# Patient Record
Sex: Female | Born: 1995
Health system: Southern US, Community
[De-identification: ages and names within clinical notes are randomized; demographics above are authoritative.]

## PROBLEM LIST (undated history)

## (undated) DIAGNOSIS — I472 Ventricular tachycardia: Secondary | ICD-10-CM

## (undated) DIAGNOSIS — I493 Ventricular premature depolarization: Secondary | ICD-10-CM

## (undated) DIAGNOSIS — I4721 Torsades de pointes: Secondary | ICD-10-CM

## (undated) DIAGNOSIS — E739 Lactose intolerance, unspecified: Secondary | ICD-10-CM

## (undated) DIAGNOSIS — I428 Other cardiomyopathies: Secondary | ICD-10-CM

## (undated) HISTORY — DX: Lactose intolerance, unspecified: E73.9

---
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
## 2016-03-09 ENCOUNTER — Emergency Department (HOSPITAL_COMMUNITY)
Admission: EM | Admit: 2016-03-09 | Discharge: 2016-03-09 | Disposition: A | Discharge status: Home / Self Care | Payer: BLUE CROSS/BLUE SHIELD | Attending: Emergency Medicine | Admitting: Emergency Medicine

## 2016-03-09 ENCOUNTER — Encounter (HOSPITAL_COMMUNITY): Payer: Self-pay | Admitting: Emergency Medicine

## 2016-03-09 DIAGNOSIS — Z3202 Encounter for pregnancy test, result negative: Secondary | ICD-10-CM | POA: Insufficient documentation

## 2016-03-09 DIAGNOSIS — M545 Low back pain: Secondary | ICD-10-CM | POA: Diagnosis present

## 2016-03-09 DIAGNOSIS — N12 Tubulo-interstitial nephritis, not specified as acute or chronic: Secondary | ICD-10-CM | POA: Diagnosis not present

## 2016-03-09 DIAGNOSIS — Z793 Long term (current) use of hormonal contraceptives: Secondary | ICD-10-CM | POA: Insufficient documentation

## 2016-03-09 LAB — URINALYSIS, ROUTINE W REFLEX MICROSCOPIC
BILIRUBIN URINE: NEGATIVE
GLUCOSE, UA: NEGATIVE mg/dL
KETONES UR: NEGATIVE mg/dL
Nitrite: NEGATIVE
PH: 7 (ref 5.0–8.0)
PROTEIN: NEGATIVE mg/dL
Specific Gravity, Urine: 1.005 (ref 1.005–1.030)

## 2016-03-09 LAB — URINE MICROSCOPIC-ADD ON

## 2016-03-09 LAB — POC URINE PREG, ED: Preg Test, Ur: NEGATIVE

## 2016-03-09 MED ORDER — CEPHALEXIN 500 MG PO CAPS: 500.0000 mg | ORAL_CAPSULE | Freq: Two times a day (BID) | ORAL | Status: DC

## 2016-03-09 MED ORDER — CEPHALEXIN 500 MG PO CAPS
500.0000 mg | ORAL_CAPSULE | Freq: Once | ORAL | Status: AC
  Administered 2016-03-09: 500 mg via ORAL
  Filled 2016-03-09: qty 1

## 2016-03-09 MED ORDER — ONDANSETRON 4 MG PO TBDP: 4.0000 mg | ORAL_TABLET | Freq: Three times a day (TID) | ORAL | Status: DC | PRN

## 2016-03-09 NOTE — Discharge Instructions (Signed)

## 2016-03-09 NOTE — ED Provider Notes (Signed)
CSN: [id number]     Arrival date & time 03/09/16  0016 History  By signing my name below, I, [facility], attest that this documentation has been prepared under the direction and in the presence of Laurence Spates, MD. Electronically Signed: Randell Patient, ED Scribe. 03/09/2016. 1:51 AM.   Chief Complaint  Patient presents with  . Back Pain   The history is provided by the patient. No language interpreter was used.   HPI Comments: [name] is a 20 y.o. female who presents to the Emergency Department complaining of constant, mild, gradually worsening bilateral lower back pain onset 1 week ago. She reports dysuria that lasted for 2 days before resolving yesterday, nausea, chills, fever TMAX 100, diarrhea yesterday, and dark colored urine that she associates with drinking less fluid. Denies hx of chronic conditions. Denies abdominal pain, emesis, vaginal bleeding, vaginal discharge. NKDA.  Past Medical History  Diagnosis Date  . Lactose intolerance    History reviewed. No pertinent past surgical history. Family History  Problem Relation Age of Onset  . Hyperlipidemia Maternal Grandfather    Social History  Substance Use Topics  . Smoking status: Never Smoker   . Smokeless tobacco: Never Used  . Alcohol Use: No   OB History    No data available     Review of Systems A complete 10 system review of systems was obtained and all systems are negative except as noted in the HPI and PMH.   Allergies  Gluten meal  Home Medications   Prior to Admission medications   Medication Sig Start Date End Date Taking? Authorizing Provider  ibuprofen (ADVIL,MOTRIN) 200 MG tablet Take 400 mg by mouth every 6 (six) hours as needed for headache, mild pain or moderate pain.   Yes Historical Provider, MD  JUNEL 1.5/30 1.5-30 MG-MCG tablet Take 1 tablet by mouth daily. 02/28/16  Yes Historical Provider, MD  cephALEXin (KEFLEX) 500 MG capsule Take 1 capsule (500 mg total) by  mouth 2 (two) times daily. 03/09/16   Laurence Spates, MD  ondansetron (ZOFRAN ODT) 4 MG disintegrating tablet Take 1 tablet (4 mg total) by mouth every 8 (eight) hours as needed for nausea or vomiting. 03/09/16   Ambrose [place] Talyn Eddie, MD   BP 123/68 mmHg  Pulse 65  Temp(Src) 99 F (37.2 C) (Oral)  Resp 20  Ht 5\' 6"  (1.676 m)  Wt 130 lb (58.968 kg)  BMI 20.99 kg/m2  SpO2 97%  LMP 02/23/2016 (Approximate) Physical Exam  Constitutional: She is oriented to person, place, and time. She appears well-developed and well-nourished. No distress.  HENT:  Head: Normocephalic and atraumatic.  Moist mucous membranes  Eyes: Conjunctivae are normal. Pupils are equal, round, and reactive to light.  Neck: Neck supple.  Cardiovascular: Normal rate, regular rhythm and normal heart sounds.   No murmur heard. Pulmonary/Chest: Effort normal and breath sounds normal.  Abdominal: Soft. Bowel sounds are normal. She exhibits no distension. There is no tenderness.  Genitourinary:  No CVA tenderness  Musculoskeletal: She exhibits no edema.  Neurological: She is alert and oriented to person, place, and time.  Fluent speech  Skin: Skin is warm and dry.  Psychiatric: She has a normal mood and affect. Judgment normal.  Nursing note and vitals reviewed.   ED Course  Procedures   DIAGNOSTIC STUDIES: Oxygen Saturation is 97% on RA, normal by my interpretation.    COORDINATION OF CARE: 1:45 AM Discussed lab results. Will order urine culture. Will order and prescribe  Keflex. Will prescribe Zofran. Advised pt to increase fluid intake. Advised pt to return if symptoms worsen. Discussed treatment plan with pt at bedside and pt agreed to plan.   Labs Review Labs Reviewed  URINALYSIS, ROUTINE W REFLEX MICROSCOPIC (NOT AT [facility]) - Abnormal; Notable for the following:    APPearance CLOUDY (*)    Hgb urine dipstick TRACE (*)    Leukocytes, UA TRACE (*)    All other components within normal limits  URINE  MICROSCOPIC-ADD ON - Abnormal; Notable for the following:    Squamous Epithelial / LPF 0-5 (*)    Bacteria, UA FEW (*)    All other components within normal limits  URINE CULTURE  POC URINE PREG, ED   I have personally reviewed and evaluated these  lab results as part of my medical decision-making.  Medications  cephALEXin (KEFLEX) capsule 500 mg (500 mg Oral Given 03/09/16 0158)     MDM   Final diagnoses:  Pyelonephritis   Patient with 1 week of low back pain bilaterally and several days of dysuria followed by nausea and chills today. On exam she was well-appearing with normal vital signs. No abdominal tenderness or CVA tenderness. UA shows leukocytes, large amount of wbc's and bacteria. Urine culture sent. UA suggest infection and given her symptoms I suspect pyelonephritis. Gave dose of Keflex. Patient is well-hydrated, has had no vomiting, and is afebrile here therefore I feel she is safe for discharge home. Provided with Keflex for treatment of pyelonephritis and emphasized the importance of return precautions including any worsening symptoms. Patient and her mom voiced understanding and she was discharged in satisfactory condition.  I personally performed the services described in this documentation, which was scribed in my presence. The recorded information has been reviewed and is accurate.    Laurence Spates, MD 03/09/16 [id number]

## 2016-03-09 NOTE — ED Notes (Signed)
Pt states she has been having lower back pain for about week   Pt states the pain has progressively gotten worse and yesterday she started having nausea and today she developed chills and a low grade fever

## 2016-03-11 LAB — URINE CULTURE
Culture: >=100000 — AB
SPECIAL REQUESTS: NORMAL

## 2016-03-13 ENCOUNTER — Telehealth (HOSPITAL_BASED_OUTPATIENT_CLINIC_OR_DEPARTMENT_OTHER): Payer: Self-pay | Admitting: Emergency Medicine

## 2016-03-13 NOTE — Telephone Encounter (Signed)
Post ED Visit - Positive Culture Follow-up  Culture report reviewed by antimicrobial stewardship pharmacist:  []  Enzo Bi, Pharm.D. []  Celedonio Miyamoto, Pharm.D., BCPS []  Garvin Fila, Pharm.D. []  Georgina Pillion, Pharm.D., BCPS []  [place], [place].D., BCPS, AAHIVP []  Estella Husk, Pharm.D., BCPS, AAHIVP [x]  Tennis Must, Pharm.D. []  Sherle Poe, [place].D.  Positive urine culture Treated with cephalexin, organism sensitive to the same and no further patient follow-up is required at this time.  Berle Mull 03/13/2016, 10:57 AM

## 2021-01-18 NOTE — Progress Notes (Unsigned)
Cardiology Office Note:    Date:  01/20/2021   ID:  GENESEE NASE, DOB 1996/05/21, MRN [id number]  PCP:  Patient, No Pcp Per  Cardiologist:  No primary care provider on file.  Electrophysiologist:  None   Referring MD: No ref. provider found   Chief Complaint  Patient presents with  . Congestive Heart Failure    History of Present Illness:    [name] is a 25 y.o. female with a hx of recently diagnosed combined systolic and diastolic heart failure who presents for an initial evaluation.  She is from [place] but is currently in graduate school in [place].  She presented to the ED in [place] on 11/11/2020 with chest pain and palpitations.  CTPA was done and showed no evidence of PE.  High-sensitivity troponins were elevated but flat (72 > 73 > 68).  She was discharged from the ED but given elevated troponin was referred to cardiology for evaluation.  She saw Dr. Sela Hua in [facility] on 11/19/2020 and echocardiogram and Holter monitor were ordered.  Echocardiogram on 12/03/2020 showed severe LV dilatation, severe systolic dysfunction (EF 15 to 20%), normal RV function, no significant valvular disease.  Holter monitor showed rare ectopy and 6 beats run of NSVT.  Given her systolic dysfunction, Lexiscan Myoview was done on 01/04/2021, which showed anterior perfusion defect concerning for ischemia, EF 26%.  She presented back to the ED with chest pain on 01/09/2021.  Cardiac catheterization on 01/10/2021 showed normal coronary arteries, normal LVEDP.  She has been started on GDMT, but has been limited by soft blood pressures.  Currently on Entresto 24-26 mg twice daily, carvedilol 4.6875 mg twice daily, and Jardiance 10 mg daily.  No smoking history.  No history of heart disease in her immediate family.  She reports that she first started noticing lightheadedness in September 2021.  Subsequently had COVID-19 infection in October and noted worsening shortness of  breath and lightheadedness since that time.  Also noted intermittent palpitations.  Symptoms progressively worsened in December and January, prompting her to present to the ED on 1/6.  She currently reports that she is doing okay.  Has been having lethargy but has improved.  Reports she gets short of breath with exertion, particular when walking up stairs.  She continues to have intermittent lightheadedness but has improved.  No LE edema, reports weight has been stable.  Has been monitoring BP at home, has been in the 90s to 100s over 50s to 70s.  Reports she feels lightheaded when BP is in the 90s.  Has been checking her pulse at home and will note that it will sometimes drop to the 30s.  Past Medical History:  Diagnosis Date  . Lactose intolerance     History reviewed. No pertinent surgical history.  Current Medications: Current Meds  Medication Sig  . carvedilol (COREG) 3.125 MG tablet Take 1.5 tablets by mouth in the morning and at bedtime.  . empagliflozin (JARDIANCE) 10 MG TABS tablet Take 1 tablet by mouth daily.  Samuel Jester 0.18/0.215/0.25 MG-35 MCG tablet Take 1 tablet by mouth daily.  . valACYclovir (VALTREX) 1000 MG tablet Take 1,000 mg by mouth as needed for rash.  . [DISCONTINUED] cephALEXin (KEFLEX) 500 MG capsule Take 1 capsule (500 mg total) by mouth 2 (two) times daily.  . [DISCONTINUED] ibuprofen (ADVIL,MOTRIN) 200 MG tablet Take 400 mg by mouth every 6 (six) hours as needed for headache, mild pain or moderate pain.  . [DISCONTINUED] Colleen Can  1.5/30 1.5-30 MG-MCG tablet Take 1 tablet by mouth daily.  . [DISCONTINUED] ondansetron (ZOFRAN ODT) 4 MG disintegrating tablet Take 1 tablet (4 mg total) by mouth every 8 (eight) hours as needed for nausea or vomiting.     Allergies:   Gluten meal   Social History   Socioeconomic History  . Marital status: Single    Spouse name: Not on file  . Number of children: Not on file  . Years of education: Not on file  . Highest  education level: Not on file  Occupational History  . Not on file  Tobacco Use  . Smoking status: Never Smoker  . Smokeless tobacco: Never Used  Substance and Sexual Activity  . Alcohol use: No  . Drug use: No  . Sexual activity: Never  Other Topics Concern  . Not on file  Social History Narrative   [employer]   10th grade.   volleyball   Social Determinants of Corporate investment banker Strain: Not on file  Food Insecurity: Not on file  Transportation Needs: Not on file  Physical Activity: Not on file  Stress: Not on file  Social Connections: Not on file     Family History: The patient's family history includes Hyperlipidemia in her maternal grandfather.  ROS:   Please see the history of present illness.     All other systems reviewed and are negative.  EKGs/Labs/Other Studies Reviewed:    The following studies were reviewed today:   EKG:  EKG is ordered today.  The ekg ordered today demonstrates normal sinus rhythm, rate 91, <47mm ST depression in I, V5/6, QRS 104, QTc 477  Recent Labs: 01/19/2021: ALT 11; BUN 10; Creatinine, Ser 0.85; Hemoglobin 13.7; Magnesium 2.1; Platelets 281; Potassium 4.5; Sodium 139  Recent Lipid Panel No results found for: CHOL, TRIG, HDL, CHOLHDL, VLDL, LDLCALC, LDLDIRECT  Physical Exam:    VS:  BP 128/72   Pulse 99   Ht 5\' 6"  (1.676 m)   Wt 182 lb (82.6 kg)   SpO2 99%   BMI 29.38 kg/m     Wt Readings from Last 3 Encounters:  01/19/21 182 lb (82.6 kg)  03/09/16 130 lb (59 kg) (59 %, Z= 0.22)*  04/11/13 138 lb 7 oz (62.8 kg) (80 %, Z= 0.83)*   * Growth percentiles are based on CDC (Girls, 2-20 Years) data.     GEN:  in no acute distress HEENT: Normal NECK: No JVD; No carotid bruits LYMPHATICS: No lymphadenopathy CARDIAC: RRR, no murmurs, rubs, gallops RESPIRATORY:  Clear to auscultation without rales, wheezing or rhonchi  ABDOMEN: Soft, non-tender, non-distended MUSCULOSKELETAL:  No edema; No deformity  SKIN:  Warm and dry NEUROLOGIC:  Alert and oriented x 3 PSYCHIATRIC:  Normal affect   ASSESSMENT:    1. Acute combined systolic and diastolic heart failure (HCC)   2. Bradycardia   3. PVC's (premature ventricular contractions)    PLAN:    Acute combined systolic and diastolic heart failure: Echocardiogram on 12/03/2020 showed severe LV dilatation, severe systolic dysfunction (EF 15 to 20%), normal RV function, no significant valvular disease.  Cardiac catheterization on 01/10/2021 showed normal coronary arteries, normal LVEDP.  Unclear cause of cardiomyopathy, could be myocarditis following COVID infection in October 2021.  She has been following with cardiology in November 2021 where she is a [place], but is planning on staying in [place] until at least August 2022. -Cardiac MRI to work-up etiology of nonischemic cardiomyopathy -Referral to Advanced Heart Failure -Continue  Entresto 24-26 mg twice daily, carvedilol 4.6875 mg twice daily, and Jardiance 10 mg daily.  Ideally would add spironolactone, but she reports BP has been 90s to 100s at home she has been having lightheadedness when BP is in the 90s.  Unable to titrate medications at this time  -Normal TSH on recent labs, will check CMET, CBC, iron studies -Recommend avoiding exercise for now as myocarditis on differential, will f/u results of cardiac MRI  Bradycardia/PVCs: reports has noted HR down to 30s when checks at home.  Will check Zio patch x 3 days  RTC in 1 month  Medication Adjustments/Labs and Tests Ordered: Current medicines are reviewed at length with the patient today.  Concerns regarding medicines are outlined above.  Orders Placed This Encounter  Procedures  . MR CARDIAC MORPHOLOGY W WO CONTRAST  . Comprehensive metabolic panel  . CBC  . Magnesium  . Fe+TIBC+Fer  . AMB referral to CHF clinic  . LONG TERM MONITOR (3-14 DAYS)  . EKG 12-Lead   No orders of the defined types were placed in this  encounter.   Patient Instructions  Medication Instructions:  Your physician recommends that you continue on your current medications as directed. Please refer to the Current Medication list given to you today.  *If you need a refill on your cardiac medications before your next appointment, please call your pharmacy*   Lab Work: CMET, CBC, Mag, Iron studies today  If you have labs (blood work) drawn today and your tests are completely normal, you will receive your results only by: Marland Kitchen MyChart Message (if you have MyChart) OR . A paper copy in the mail If you have any lab test that is abnormal or we need to change your treatment, we will call you to review the results.   Testing/Procedures: Your physician has requested that you have a cardiac MRI. Cardiac MRI uses a computer to create images of your heart as its beating, producing both still and moving pictures of your heart and major blood vessels. For further information please visit [email or web address]. Please follow the instruction sheet given to you today for more information.   ZIO XT- Long Term Monitor Instructions   Your physician has requested you wear your ZIO patch monitor 3 days.   This is a single patch monitor.  [employer] supplies one patch monitor per enrollment.  Additional stickers are not available.   Please do not apply patch if you will be having a Nuclear Stress Test, Echocardiogram, Cardiac CT, MRI, or Chest Xray during the time frame you would be wearing the monitor. The patch cannot be worn during these tests.  You cannot remove and re-apply the ZIO XT patch monitor.   Your ZIO patch monitor will be sent USPS Priority mail from [facility] directly to your home address. The monitor may also be mailed to a PO BOX if home delivery is not available.   It may take 3-5 days to receive your monitor after you have been enrolled.   Once you have received you monitor, please review enclosed instructions.  Your  monitor has already been registered assigning a specific monitor serial # to you.   Applying the monitor   Shave hair from upper left chest.   Hold abrader disc by orange tab.  Rub abrader in 40 strokes over left upper chest as indicated in your monitor instructions.   Clean area with 4 enclosed alcohol pads .  Use all pads to assure are is cleaned thoroughly.  Let  dry.   Apply patch as indicated in monitor instructions.  Patch will be place under collarbone on left side of chest with arrow pointing upward.   Rub patch adhesive wings for 2 minutes.Remove white label marked "1".  Remove white label marked "2".  Rub patch adhesive wings for 2 additional minutes.   While looking in a mirror, press and release button in center of patch.  A small green light will flash 3-4 times .  This will be your only indicator the monitor has been turned on.     Do not shower for the first 24 hours.  You may shower after the first 24 hours.   Press button if you feel a symptom. You will hear a small click.  Record Date, Time and Symptom in the Patient Log Book.   When you are ready to remove patch, follow instructions on last 2 pages of Patient Log Book.  Stick patch monitor onto last page of Patient Log Book.   Place Patient Log Book in [facility] box.  Use locking tab on box and tape box closed securely.  The [employer] and [employer] has [employer] on it.  Please place in mailbox as soon as possible.  Your physician should have your test results approximately 7 days after the monitor has been mailed back to [facility].   Call [facility] Customer Care at [phone] if you have questions regarding your ZIO XT patch monitor.  Call them immediately if you see an orange light blinking on your monitor.   If your monitor falls off in less than 4 days contact our Monitor department at [phone].  If your monitor becomes loose or falls off after 4 days call [employer] at [phone] for suggestions on  securing your monitor.   Follow-Up: At [facility], you and your health needs are our priority.  As part of our continuing mission to provide you with exceptional heart care, we have created designated Provider Care Teams.  These Care Teams include your primary Cardiologist (physician) and Advanced Practice Providers (APPs -  Physician Assistants and Nurse Practitioners) who all work together to provide you with the care you need, when you need it.  We recommend signing up for the patient portal called "MyChart".  Sign up information is provided on this After Visit Summary.  MyChart is used to connect with patients for Virtual Visits (Telemedicine).  Patients are able to view lab/test results, encounter notes, upcoming appointments, etc.  Non-urgent messages can be sent to your provider as well.   To learn more about what you can do with MyChart, go to [email or web address].    Your next appointment:   1 month(s)  The format for your next appointment:   In Person  Provider:   Epifanio Lesches, MD   Other Instructions Please check your blood pressure at home daily, write it down.  Call the office or send message via Mychart with the readings in 1-2 weeks for Dr. Bjorn Pippin to review.    You have been referred to: Advanced Heart Failure Clinic at [facility]      Signed, Little Ishikawa, MD  01/20/2021 3:38 PM    [facility]

## 2021-01-19 ENCOUNTER — Encounter: Payer: Self-pay | Admitting: Cardiology

## 2021-01-19 ENCOUNTER — Encounter: Payer: Self-pay | Admitting: Radiology

## 2021-01-19 ENCOUNTER — Ambulatory Visit (INDEPENDENT_AMBULATORY_CARE_PROVIDER_SITE_OTHER): Payer: BC Managed Care – PPO

## 2021-01-19 ENCOUNTER — Ambulatory Visit (INDEPENDENT_AMBULATORY_CARE_PROVIDER_SITE_OTHER): Payer: BC Managed Care – PPO | Admitting: Cardiology

## 2021-01-19 ENCOUNTER — Other Ambulatory Visit: Payer: Self-pay

## 2021-01-19 VITALS — BP 128/72 | HR 99 | Ht 66.0 in | Wt 182.0 lb

## 2021-01-19 DIAGNOSIS — R001 Bradycardia, unspecified: Secondary | ICD-10-CM

## 2021-01-19 DIAGNOSIS — I5041 Acute combined systolic (congestive) and diastolic (congestive) heart failure: Secondary | ICD-10-CM | POA: Diagnosis not present

## 2021-01-19 DIAGNOSIS — I493 Ventricular premature depolarization: Secondary | ICD-10-CM | POA: Diagnosis not present

## 2021-01-19 LAB — COMPREHENSIVE METABOLIC PANEL
ALT: 11 IU/L (ref 0–32)
AST: 19 IU/L (ref 0–40)
Albumin/Globulin Ratio: 1.7 (ref 1.2–2.2)
Albumin: 4.6 g/dL (ref 3.9–5.0)
Alkaline Phosphatase: 51 IU/L (ref 44–121)
BUN/Creatinine Ratio: 12 (ref 9–23)
BUN: 10 mg/dL (ref 6–20)
Bilirubin Total: 0.7 mg/dL (ref 0.0–1.2)
CO2: 23 mmol/L (ref 20–29)
Calcium: 9.4 mg/dL (ref 8.7–10.2)
Chloride: 99 mmol/L (ref 96–106)
Creatinine, Ser: 0.85 mg/dL (ref 0.57–1.00)
Globulin, Total: 2.7 g/dL (ref 1.5–4.5)
Glucose: 92 mg/dL (ref 65–99)
Potassium: 4.5 mmol/L (ref 3.5–5.2)
Sodium: 139 mmol/L (ref 134–144)
Total Protein: 7.3 g/dL (ref 6.0–8.5)
eGFR: 99 mL/min/{1.73_m2} (ref >59)

## 2021-01-19 LAB — IRON,TIBC AND FERRITIN PANEL
Ferritin: 28 ng/mL (ref 15–150)
Iron Saturation: 28 % (ref 15–55)
Iron: 98 ug/dL (ref 27–159)
Total Iron Binding Capacity: 346 ug/dL (ref 250–450)
UIBC: 248 ug/dL (ref 131–425)

## 2021-01-19 LAB — CBC
Hematocrit: 41.6 % (ref 34.0–46.6)
Hemoglobin: 13.7 g/dL (ref 11.1–15.9)
MCH: 28.2 pg (ref 26.6–33.0)
MCHC: 32.9 g/dL (ref 31.5–35.7)
MCV: 86 fL (ref 79–97)
Platelets: 281 10*3/uL (ref 150–450)
RBC: 4.86 x10E6/uL (ref 3.77–5.28)
RDW: 13.2 % (ref 11.7–15.4)
WBC: 6.3 10*3/uL (ref 3.4–10.8)

## 2021-01-19 LAB — MAGNESIUM: Magnesium: 2.1 mg/dL (ref 1.6–2.3)

## 2021-01-19 NOTE — Patient Instructions (Signed)
Medication Instructions:  Your physician recommends that you continue on your current medications as directed. Please refer to the Current Medication list given to you today.  *If you need a refill on your cardiac medications before your next appointment, please call your pharmacy*   Lab Work: CMET, CBC, Mag, Iron studies today  If you have labs (blood work) drawn today and your tests are completely normal, you will receive your results only by: Marland Kitchen MyChart Message (if you have MyChart) OR . A paper copy in the mail If you have any lab test that is abnormal or we need to change your treatment, we will call you to review the results.   Testing/Procedures: Your physician has requested that you have a cardiac MRI. Cardiac MRI uses a computer to create images of your heart as its beating, producing both still and moving pictures of your heart and major blood vessels. For further information please visit [email or web address]. Please follow the instruction sheet given to you today for more information.   ZIO XT- Long Term Monitor Instructions   Your physician has requested you wear your ZIO patch monitor 3 days.   This is a single patch monitor.  [employer] supplies one patch monitor per enrollment.  Additional stickers are not available.   Please do not apply patch if you will be having a Nuclear Stress Test, Echocardiogram, Cardiac CT, MRI, or Chest Xray during the time frame you would be wearing the monitor. The patch cannot be worn during these tests.  You cannot remove and re-apply the ZIO XT patch monitor.   Your ZIO patch monitor will be sent USPS Priority mail from [facility] directly to your home address. The monitor may also be mailed to a PO BOX if home delivery is not available.   It may take 3-5 days to receive your monitor after you have been enrolled.   Once you have received you monitor, please review enclosed instructions.  Your monitor has already been registered assigning  a specific monitor serial # to you.   Applying the monitor   Shave hair from upper left chest.   Hold abrader disc by orange tab.  Rub abrader in 40 strokes over left upper chest as indicated in your monitor instructions.   Clean area with 4 enclosed alcohol pads .  Use all pads to assure are is cleaned thoroughly.  Let dry.   Apply patch as indicated in monitor instructions.  Patch will be place under collarbone on left side of chest with arrow pointing upward.   Rub patch adhesive wings for 2 minutes.Remove white label marked "1".  Remove white label marked "2".  Rub patch adhesive wings for 2 additional minutes.   While looking in a mirror, press and release button in center of patch.  A small green light will flash 3-4 times .  This will be your only indicator the monitor has been turned on.     Do not shower for the first 24 hours.  You may shower after the first 24 hours.   Press button if you feel a symptom. You will hear a small click.  Record Date, Time and Symptom in the Patient Log Book.   When you are ready to remove patch, follow instructions on last 2 pages of Patient Log Book.  Stick patch monitor onto last page of Patient Log Book.   Place Patient Log Book in [place] box.  Use locking tab on box and tape box closed securely.  The [facility]  and White box has [employer] on it.  Please place in mailbox as soon as possible.  Your physician should have your test results approximately 7 days after the monitor has been mailed back to [facility].   Call [facility] Customer Care at [phone] if you have questions regarding your ZIO XT patch monitor.  Call them immediately if you see an orange light blinking on your monitor.   If your monitor falls off in less than 4 days contact our Monitor department at [phone].  If your monitor becomes loose or falls off after 4 days call [employer] at [phone] for suggestions on securing your monitor.   Follow-Up: At [facility], you and your health needs are our priority.  As part of our continuing mission to provide you with exceptional heart care, we have created designated Provider Care Teams.  These Care Teams include your primary Cardiologist (physician) and Advanced Practice Providers (APPs -  Physician Assistants and Nurse Practitioners) who all work together to provide you with the care you need, when you need it.  We recommend signing up for the patient portal called "MyChart".  Sign up information is provided on this After Visit Summary.  MyChart is used to connect with patients for Virtual Visits (Telemedicine).  Patients are able to view lab/test results, encounter notes, upcoming appointments, etc.  Non-urgent messages can be sent to your provider as well.   To learn more about what you can do with MyChart, go to [email or web address].    Your next appointment:   1 month(s)  The format for your next appointment:   In Person  Provider:   Epifanio Lesches, MD   Other Instructions Please check your blood pressure at home daily, write it down.  Call the office or send message via Mychart with the readings in 1-2 weeks for Dr. Bjorn Pippin to review.    You have been referred to: Advanced Heart Failure Clinic at [facility]

## 2021-01-19 NOTE — Progress Notes (Signed)
Enrolled patient for a 3 day Zio XT Monitor to be mailed to patients home.  °

## 2021-01-21 DIAGNOSIS — I493 Ventricular premature depolarization: Secondary | ICD-10-CM | POA: Diagnosis not present

## 2021-01-21 DIAGNOSIS — R001 Bradycardia, unspecified: Secondary | ICD-10-CM

## 2021-01-25 ENCOUNTER — Telehealth (HOSPITAL_COMMUNITY): Payer: Self-pay | Admitting: Emergency Medicine

## 2021-01-25 NOTE — Telephone Encounter (Signed)
Reaching out to patient to offer assistance regarding upcoming cardiac imaging study; pt verbalizes understanding of appt date/time, parking situation and where to check in,and verified current allergies; name and call back number provided for further questions should they arise Mary Kessner RN Navigator Cardiac Imaging [facility] [phone] office [phone] cell   Pt denies claustro, denies implants [name]  

## 2021-01-26 ENCOUNTER — Other Ambulatory Visit: Payer: Self-pay

## 2021-01-26 ENCOUNTER — Ambulatory Visit (HOSPITAL_COMMUNITY)
Admission: RE | Admit: 2021-01-26 | Discharge: 2021-01-26 | Disposition: A | Discharge status: Home / Self Care | Payer: BC Managed Care – PPO | Source: Ambulatory Visit | Attending: Cardiology | Admitting: Cardiology

## 2021-01-26 DIAGNOSIS — I5041 Acute combined systolic (congestive) and diastolic (congestive) heart failure: Secondary | ICD-10-CM

## 2021-01-26 MED ORDER — GADOBUTROL 1 MMOL/ML IV SOLN
8.0000 mL | Freq: Once | INTRAVENOUS | Status: AC | PRN
  Administered 2021-01-26: 8 mL via INTRAVENOUS

## 2021-01-27 ENCOUNTER — Other Ambulatory Visit (HOSPITAL_COMMUNITY): Payer: Self-pay | Admitting: Internal Medicine

## 2021-01-27 ENCOUNTER — Ambulatory Visit (HOSPITAL_COMMUNITY)
Admission: RE | Admit: 2021-01-27 | Discharge: 2021-01-27 | Disposition: A | Discharge status: Home / Self Care | Payer: BC Managed Care – PPO | Source: Ambulatory Visit | Attending: Internal Medicine | Admitting: Internal Medicine

## 2021-01-27 ENCOUNTER — Encounter (HOSPITAL_COMMUNITY): Payer: Self-pay | Admitting: Internal Medicine

## 2021-01-27 VITALS — BP 110/82 | HR 91 | Wt 184.6 lb

## 2021-01-27 DIAGNOSIS — I5022 Chronic systolic (congestive) heart failure: Secondary | ICD-10-CM

## 2021-01-27 DIAGNOSIS — I5042 Chronic combined systolic (congestive) and diastolic (congestive) heart failure: Secondary | ICD-10-CM

## 2021-01-27 DIAGNOSIS — I493 Ventricular premature depolarization: Secondary | ICD-10-CM

## 2021-01-27 DIAGNOSIS — I428 Other cardiomyopathies: Secondary | ICD-10-CM | POA: Diagnosis not present

## 2021-01-27 MED ORDER — SPIRONOLACTONE 25 MG PO TABS: 12.5000 mg | ORAL_TABLET | Freq: Every day | ORAL | 3 refills | Status: DC

## 2021-01-27 MED ORDER — CARVEDILOL 6.25 MG PO TABS: 6.2500 mg | ORAL_TABLET | Freq: Two times a day (BID) | ORAL | 11 refills | Status: DC

## 2021-01-27 NOTE — Progress Notes (Signed)
Zio patch placed onto patient.  All instructions and information reviewed with patient, they verbalize understanding with no questions. 

## 2021-01-27 NOTE — Patient Instructions (Addendum)
No Labs done today.   INCREASE Carvedilol to 6.25mg  (1 tablet) by mouth 2 times daily.   START Spironolactone 12.5mg  (1/2 tablet) by mouth daily at bedtime.  No other medication changes were made. Please continue all current medications as prescribed.  Your physician recommends that you schedule a follow-up appointment in: 2 weeks for a lab only appointment and in 4 weeks for an appointment with Dr. Gala Romney.   Your provider has recommended that  you wear a Zio Patch for 14 days.  This monitor will record your heart rhythm for our review.  IF you have any symptoms while wearing the monitor please press the button.  If you have any issues with the patch or you notice a red or orange light on it please call the company at [phone].  Once you remove the patch please mail it back to the company as soon as possible so we can get the results.  If you have any questions or concerns before your next appointment please send [place] a message through [place] or call our office at [phone].    TO LEAVE A MESSAGE FOR THE NURSE SELECT OPTION 2, PLEASE LEAVE A MESSAGE INCLUDING: . YOUR NAME . DATE OF BIRTH . CALL BACK NUMBER . REASON FOR CALL**this is important as we prioritize the call backs  YOU WILL RECEIVE A CALL BACK THE SAME DAY AS LONG AS YOU CALL BEFORE 4:00 PM   Do the following things EVERYDAY: 1) Weigh yourself in the morning before breakfast. Write it down and keep it in a log. 2) Take your medicines as prescribed 3) Eat low salt foods--Limit salt (sodium) to 2000 mg per day.  4) Stay as active as you can everyday 5) Limit all fluids for the day to less than 2 liters   At the Advanced [facility] Failure Clinic, you and your health needs are our priority. As part of our continuing mission to provide you with exceptional heart care, we have created designated Provider Care Teams. These Care Teams include your primary Cardiologist (physician) and Advanced Practice Providers (APPs-  Physician Assistants and Nurse Practitioners) who all work together to provide you with the care you need, when you need it.   You may see any of the following providers on your designated Care Team at your next follow up: Marland Kitchen Dr Arvilla Meres . Dr Marca Ancona . Tonye Becket, NP . Robbie Lis, PA . Karle Plumber, PharmD   Please be sure to bring in all your medications bottles to every appointment.

## 2021-01-27 NOTE — Consult Note (Signed)
ADVANCED HF CLINIC CONSULT NOTE  Referring Physician: Dr. Bjorn Pippin Primary Care: Patient, No Pcp Per Primary Cardiologist: Dr. Bjorn Pippin  HPI:  [name] is a 25 y.o. female with a hx of recently diagnosed systolic HF referred by Dr. Bjorn Pippin for further evaluation of her HF.   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  She reports that she first started noticing lightheadedness in September 2021.  Subsequently had COVID-19 infection in October (vaccianted not boosted) and noted worsening shortness of breath. palpitations and lightheadedness since that time.     She presented to the ED in [place] on 11/11/2020 with chest pain and palpitations.  Chest CT was done and showed no evidence of PE.  High-sensitivity troponins were elevated but flat (72 > 73 > 68).  She was discharged from the ED but given elevated troponin was referred to cardiology for evaluation.  She saw Dr. Sela Hua in [facility] on 11/19/2020 and echocardiogram and Holter monitor were ordered.  Echocardiogram on 12/03/2020 showed severe LV dilatation, severe systolic dysfunction (EF 15 to 20%), normal RV function, no significant valvular disease.  Holter monitor showed rare ectopy and 6 beats run of NSVT.  Given her systolic dysfunction, Lexiscan Myoview was done on 01/04/2021, which showed anterior perfusion defect concerning for ischemia, EF 26%.  She presented back to the ED with chest pain on 01/09/2021.  Cardiac catheterization on 01/10/2021 showed normal coronary arteries, normal LVEDP.  She has been started on GDMT, but has been limited by soft blood pressures.   Saw Dr. Bjorn Pippin last week. HF meds titrated. cMRI ordered and referred here for further evaluation.   cMRI 01/26/21 LVEF 31% markedly dilated No significant LGE. RV normal. No non-compaction or other pathology  She is here with her mother. Feeling better but still with spurts of PVCs.  Going for walks with family and will ride exercise bike gently. Gets SOB walking at a brisk pace or going up stairs. Still gets dizzy when standing or having more PVCs. This is getting better. No edema, orthopnea or PND. Weight stable. Avoiding salt. Takes BP daily 100-115/60-70s  No family h/o of cardiomyopathy.  PGM had MI Her younger sister has POTS   Review of Systems: [y] = yes, [ ]  = no   General: Weight gain [ ] ; Weight loss [ ] ; Anorexia [ ] ; Fatigue [ y]; Fever [ ] ; Chills [ ] ; Weakness [ ]   Cardiac: Chest pain/pressure [ ] ; Resting SOB [ ] ; Exertional SOB ]; Orthopnea [ ] ; Pedal Edema [ ] ; Palpitations [ ] ; Syncope [ ] ; Presyncope [ ] ; Paroxysmal nocturnal dyspnea[ ]   Pulmonary: Cough [ ] ; Wheezing[ ] ; Hemoptysis[ ] ; Sputum [ ] ; Snoring [ ]   GI: Vomiting[ ] ; Dysphagia[ ] ; Melena[ ] ; Hematochezia [ ] ; Heartburn[ ] ; Abdominal pain [ ] ; Constipation [ ] ; Diarrhea [ ] ; BRBPR [ ]   GU: Hematuria[ ] ; Dysuria [ ] ; Nocturia[ ]   Vascular: Pain in legs with walking [ ] ; Pain in feet with lying flat [ ] ; Non-healing sores [ ] ; Stroke [ ] ; TIA [ ] ; Slurred speech [ ] ;  Neuro: Headaches[ ] ; Vertigo[ ] ; Seizures[ ] ; Paresthesias[ ] ;Blurred vision [ ] ; Diplopia [ ] ; Vision changes [ ]   Ortho/Skin: Arthritis [ ] ; Joint pain [ ] ; Muscle pain [ ] ; Joint swelling [ ] ; Back Pain [ ] ; Rash [ ]   Psych: Depression[ ] ; Anxiety[ ]   Heme: Bleeding problems [ ] ; Clotting disorders [ ] ;  Anemia [ ]   Endocrine: Diabetes [ ] ; Thyroid dysfunction[ ]    Past Medical History:  Diagnosis Date  . Lactose intolerance     Current Outpatient Medications  Medication Sig Dispense Refill  . carvedilol (COREG) 3.125 MG tablet Take 1.5 tablets by mouth in the morning and at bedtime.    . empagliflozin (JARDIANCE) 10 MG TABS tablet Take 1 tablet by mouth daily.    ENTRESTO 24-26 MG Take 1 tablet by mouth 2 (two) times daily.    0.18/0.215/0.25 MG-35 MCG tablet Take 1 tablet by mouth daily.    .  valACYclovir (VALTREX) 1000 MG tablet Take 1,000 mg by mouth as needed for rash.     No current facility-administered medications for this encounter.    No Active Allergies    Social History   Socioeconomic History  . Marital status: Single    Spouse name: Not on file  . Number of children: Not on file  . Years of education: Not on file  . Highest education level: Not on file  Occupational History  . Not on file  Tobacco Use  . Smoking status: Never Smoker  . Smokeless tobacco: Never Used  Substance and Sexual Activity  . Alcohol use: No  . Drug use: No  . Sexual activity: Never  Other Topics Concern  . Not on file  Social History Narrative   [employer] highschool   10th grade.   volleyball   Social Determinants of 12-04-1981 Strain: Not on file  Food Insecurity: Not on file  Transportation Needs: Not on file  Physical Activity: Not on file  Stress: Not on file  Social Connections: Not on file  Intimate Partner Violence: Not on file      Family History  Problem Relation Age of Onset  . Hyperlipidemia Maternal Grandfather     Vitals:   01/27/21 1037  BP: 110/82  Pulse: 91  SpO2: 100%  Weight: 83.7 kg    PHYSICAL EXAM: General:  Well appearing. No respiratory difficulty HEENT: normal Neck: supple. no JVD. Carotids 2+ bilat; no bruits. No lymphadenopathy or thryomegaly appreciated. Cor: PMI nondisplaced. Regular rate & rhythm. No rubs, gallops or murmurs. Lungs: clear Abdomen: soft, nontender, nondistended. No hepatosplenomegaly. No bruits or masses. Good bowel sounds. Extremities: no cyanosis, clubbing, rash, edema Neuro: alert & oriented x 3, cranial nerves grossly intact. moves all 4 extremities w/o difficulty. Affect pleasant.  ECG 01/19/21 NSR 91. IVCD 01/29/21 non-specific STs Personally reviewed  ASSESSMENT & PLAN:  1. Chronic systolic HF - due to NICM. Suspect viral (? COVID) myocarditis - Echo 1/22 EF 15- 20% - Holter monitor  1/22 showed rare ectopy and 6 beats run of NSVT.   2/22 Myoview 3/22 EF 26% with anterior perfusion defect concerning for ischemia - Cardiac catheterization 01/10/2021 in [place] showed normal coronary arteries, normal LVEDP.  - cMRI 01/26/21 LVEF 31% markedly dilated No significant LGE. RV normal. No non-compaction or other pathology - Slowly improving NYHA II-III - Increase carvedilol to 6.25 bid - Continue Entresto 24/26 bid  - Continue Jardiance 10 - Add spiro 12.5 - Repeat monitor - Long discussion with her and her mother about causes of NICM and projected outcomes.I suspect this is viral in nature as there are no other smoking guns. She does seem to have frequent PVCs but recent Holter did not show high burden.  cMRI is reassuring without active inflammation or scarring and improving LV function though LV remains quite  dilated.  - Will follow closely in HF clinic for aggressive titration of GDMT. Will do POCUS at next visit and repeat formal echo in 2-3 months.  - Encouraged moderate physical activity but no strenuous activity or competitive sports.   2. PVCs - repeat Zio - avoid caffeine - does not give convincing h/o OSA  Arvilla Meres, MD  2:38 PM

## 2021-02-04 ENCOUNTER — Telehealth: Payer: Self-pay

## 2021-02-04 NOTE — Telephone Encounter (Signed)
Spoke to patient appointment with Dr.Schumann on 02/28/21 cancelled.Patient is being followed by heart failure clinic.

## 2021-02-10 ENCOUNTER — Emergency Department (HOSPITAL_COMMUNITY): Payer: BC Managed Care – PPO

## 2021-02-10 ENCOUNTER — Other Ambulatory Visit: Payer: Self-pay

## 2021-02-10 ENCOUNTER — Telehealth (HOSPITAL_COMMUNITY): Payer: Self-pay | Admitting: Internal Medicine

## 2021-02-10 ENCOUNTER — Emergency Department (HOSPITAL_COMMUNITY)
Admission: EM | Admit: 2021-02-10 | Discharge: 2021-02-10 | Disposition: A | Discharge status: Home / Self Care | Payer: BC Managed Care – PPO | Attending: Emergency Medicine | Admitting: Emergency Medicine

## 2021-02-10 DIAGNOSIS — I493 Ventricular premature depolarization: Secondary | ICD-10-CM

## 2021-02-10 DIAGNOSIS — R002 Palpitations: Secondary | ICD-10-CM | POA: Diagnosis present

## 2021-02-10 DIAGNOSIS — R0602 Shortness of breath: Secondary | ICD-10-CM | POA: Diagnosis not present

## 2021-02-10 LAB — CBC
HCT: 42 % (ref 36.0–46.0)
Hemoglobin: 13.6 g/dL (ref 12.0–15.0)
MCH: 28.3 pg (ref 26.0–34.0)
MCHC: 32.4 g/dL (ref 30.0–36.0)
MCV: 87.3 fL (ref 80.0–100.0)
Platelets: 389 10*3/uL (ref 150–400)
RBC: 4.81 MIL/uL (ref 3.87–5.11)
RDW: 13.9 % (ref 11.5–15.5)
WBC: 6 10*3/uL (ref 4.0–10.5)
nRBC: 0 % (ref 0.0–0.2)

## 2021-02-10 LAB — BASIC METABOLIC PANEL
Anion gap: 8 (ref 5–15)
BUN: 7 mg/dL (ref 6–20)
CO2: 25 mmol/L (ref 22–32)
Calcium: 9.3 mg/dL (ref 8.9–10.3)
Chloride: 104 mmol/L (ref 98–111)
Creatinine, Ser: 0.88 mg/dL (ref 0.44–1.00)
GFR, Estimated: >60 mL/min (ref >60)
Glucose, Bld: 99 mg/dL (ref 70–99)
Potassium: 4.1 mmol/L (ref 3.5–5.1)
Sodium: 137 mmol/L (ref 135–145)

## 2021-02-10 LAB — MAGNESIUM: Magnesium: 2.2 mg/dL (ref 1.7–2.4)

## 2021-02-10 LAB — BRAIN NATRIURETIC PEPTIDE: B Natriuretic Peptide: 44.4 pg/mL (ref 0.0–100.0)

## 2021-02-10 LAB — TROPONIN I (HIGH SENSITIVITY)
Troponin I (High Sensitivity): 15 ng/L (ref <18)
Troponin I (High Sensitivity): 15 ng/L (ref <18)

## 2021-02-10 LAB — I-STAT BETA HCG BLOOD, ED (MC, WL, AP ONLY): I-stat hCG, quantitative: <5 m[IU]/mL (ref <5)

## 2021-02-10 NOTE — ED Notes (Signed)
Patient is resting comfortably. 

## 2021-02-10 NOTE — Telephone Encounter (Signed)
Mom called to notify DB that pt is on the way to [facility] ER, she has fluttering in the chest, BP and HR elevated. Please advise

## 2021-02-10 NOTE — Discharge Instructions (Addendum)
We had seen in the ER for palpitations. Your heart enzymes and cardiac monitoring is reassuring -the only benign abnormality we saw was a condition called PVCs.  That being said, we are not entirely clear why he had a severe episode of palpitation prior to ER arrival.  Likely, you have a Zio patch monitor on you right now.  Requested you find out when your cardiologist will be able to interpret the data from the event monitor -and set up an appointment to be seen that day or the day after.  Please return to the ER if you start having severe symptoms.

## 2021-02-10 NOTE — ED Triage Notes (Signed)
Pt dx with CHF in January, but reports this morning that her heart rate has been higher than normal this morning, feeling fluttering and discomfort in her chest. Denies chest pain but said she just felt generally weak and uncomfortable.

## 2021-02-10 NOTE — ED Provider Notes (Signed)
[facility] EMERGENCY DEPARTMENT Provider Note   CSN: [id number] Arrival date & time: 02/10/21  1244     History Chief Complaint  Patient presents with  . Palpitations    [name] is a 25 y.o. female.  HPI    25 year old comes in w/ chief complaint of palpitations. Patient was diagnosed with dilated cardiomyopathy recently and was started on Entresto, spironolactone, Coreg.  Patient is being seen by heart failure service here.  She has a Zio patch placed for her palpitations.  She comes into the ER today with complaint of palpitations that occurred earlier today.  The episode lasted at least an hour, and patient felt like she was having "flutter" type feeling.  There was no associated chest pain, but patient did have some shortness of breath, weakness and dizziness.  Before patient was placed in the ER room, her symptoms had resolved spontaneously.  Patient has had palpitations with her dilated cardiomyopathy.  She often feels them as pounding sensation, but this new sensation was new and it lasted for a long period of time.  Past Medical History:  Diagnosis Date  . Lactose intolerance     Patient Active Problem List   Diagnosis Date Noted  . Warts 09/04/2012    No past surgical history on file.   OB History   No obstetric history on file.     Family History  Problem Relation Age of Onset  . Hyperlipidemia Maternal Grandfather     Social History   Tobacco Use  . Smoking status: Never Smoker  . Smokeless tobacco: Never Used  Substance Use Topics  . Alcohol use: No  . Drug use: No    Home Medications Prior to Admission medications   Medication Sig Start Date End Date Taking? Authorizing Provider  carvedilol (COREG) 6.25 MG tablet Take 1 tablet (6.25 mg total) by mouth in the morning and at bedtime. 01/27/21  Yes Bensimhon, Bevelyn Buckles, MD  empagliflozin (JARDIANCE) 10 MG TABS tablet Take 10 mg by mouth daily. 01/11/21 01/11/22 Yes  [provider]  ENTRESTO 24-26 MG Take 1 tablet by mouth 2 (two) times daily. 01/23/21  Yes [provider]  spironolactone (ALDACTONE) 25 MG tablet Take 0.5 tablets (12.5 mg total) by mouth at bedtime. 01/27/21  Yes Bensimhon, Bevelyn Buckles, MD  TRI-ESTARYLLA 0.18/0.215/0.25 MG-35 MCG tablet Take 1 tablet by mouth daily. 11/12/20  Yes [provider]  valACYclovir (VALTREX) 1000 MG tablet Take 1,000 mg by mouth as needed for rash. 09/06/20  Yes [provider]    Allergies    Patient has no known allergies.  Review of Systems   Review of Systems  Constitutional: Positive for activity change.  Respiratory: Negative for shortness of breath.   Cardiovascular: Positive for palpitations. Negative for chest pain.  Neurological: Positive for dizziness.  Hematological: Does not bruise/bleed easily.  All other systems reviewed and are negative.   Physical Exam Updated Vital Signs BP 107/70   Pulse 77   Temp 98.6 F (37 C) (Oral)   Resp 19   Ht 5\' 6"  (1.676 m)   Wt 81.6 kg   LMP 01/16/2021 (Exact Date)   SpO2 98%   BMI 29.05 kg/m   Physical Exam Vitals and nursing note reviewed.  Constitutional:      Appearance: She is well-developed.  HENT:     Head: Normocephalic and atraumatic.  Cardiovascular:     Rate and Rhythm: Normal rate.  Pulmonary:     Effort:  Pulmonary effort is normal.  Abdominal:     General: Bowel sounds are normal.  Musculoskeletal:     Cervical back: Normal range of motion and neck supple.  Skin:    General: Skin is warm and dry.  Neurological:     Mental Status: She is alert and oriented to person, place, and time.     ED Results / Procedures / Treatments   Labs (all labs ordered are listed, but only abnormal results are displayed) Labs Reviewed  BASIC METABOLIC PANEL  CBC  BRAIN NATRIURETIC PEPTIDE  MAGNESIUM  I-STAT BETA HCG BLOOD, ED ([facility], [facility], [facility] ONLY)  TROPONIN I (HIGH SENSITIVITY)  TROPONIN I (HIGH SENSITIVITY)     EKG EKG Interpretation  Date/Time:  Thursday February 10 2021 13:04:44 EDT Ventricular Rate:  90 PR Interval:  126 QRS Duration: 98 QT Interval:  356 QTC Calculation: 435 R Axis:   74 Text Interpretation: Normal sinus rhythm Left ventricular hypertrophy with repolarization abnormality ( Romhilt-Estes ) Abnormal ECG Nonspecific ST abnormality Confirmed by Derwood Kaplan ([id number]) on 02/10/2021 6:15:33 PM   Radiology DG Chest 2 View  Result Date: 02/10/2021 CLINICAL DATA:  Chest pain EXAM: CHEST - 2 VIEW COMPARISON:  September 05, 2006. FINDINGS: Lungs are clear. Heart size and pulmonary vascularity are normal. No adenopathy. Monitor device overlying the left chest anteriorly. No bone lesions. IMPRESSION: Lungs clear.  Heart size normal. Electronically Signed   By: Bretta Bang III M.D.   On: 02/10/2021 13:53    Procedures Procedures   Medications Ordered in ED Medications - No data to display  ED Course  I have reviewed the triage vital signs and the nursing notes.  Pertinent labs & imaging results that were available during my care of the patient were reviewed by me and considered in my medical decision making (see chart for details).    MDM Rules/Calculators/A&P                          25 year old comes in w/ chief complaint of palpitations.  She has known history of dilated cardiomyopathy and has been diagnosed with PVCs.  She has a Zio patch on at this time, the device will be turned in for interrogation tomorrow.  During our assessment, patient is feeling a lot better and is not symptomatic.  She is noted to have heart rate in the 90s which appears to be higher than her baseline resting heart rate.  She had no chest pain, and although she is on OCPs and had a recent drive from [place], our suspicion is that the underlying etiologies not PE.  We are more suspicious for tacky dysrhythmia such as A. Fib/flutter.  Nonsustained V. tach or VT's are also in the  differential diagnosis.  Plan is to get appropriate labs make sure there is no electrolyte abnormalities and monitor her on the telemetry for extended period of time.  9:01 PM Telemetry monitoring has revealed episodes of PVCs and bigeminy.  No fib flutter or VT's.  Results discussed with the patient.  She will return in her device tomorrow, inquire when interrogation data will be made available to the physician and call the clinic to set up an appointment for that day or the day after.  At the family's request, I have sent a message to Dr. Gala Romney about this visit as well.   Final Clinical Impression(s) / ED Diagnoses Final diagnoses:  Palpitations  PVC (premature ventricular contraction)  Rx / DC Orders ED Discharge Orders    None       Derwood Kaplan, MD 02/10/21 2102

## 2021-02-14 ENCOUNTER — Other Ambulatory Visit: Payer: Self-pay

## 2021-02-14 ENCOUNTER — Ambulatory Visit (HOSPITAL_COMMUNITY)
Admission: RE | Admit: 2021-02-14 | Discharge: 2021-02-14 | Disposition: A | Discharge status: Home / Self Care | Payer: BC Managed Care – PPO | Source: Ambulatory Visit | Attending: Internal Medicine | Admitting: Internal Medicine

## 2021-02-14 DIAGNOSIS — I5042 Chronic combined systolic (congestive) and diastolic (congestive) heart failure: Secondary | ICD-10-CM | POA: Insufficient documentation

## 2021-02-14 LAB — BASIC METABOLIC PANEL
Anion gap: 9 (ref 5–15)
BUN: 9 mg/dL (ref 6–20)
CO2: 26 mmol/L (ref 22–32)
Calcium: 8.9 mg/dL (ref 8.9–10.3)
Chloride: 103 mmol/L (ref 98–111)
Creatinine, Ser: 0.78 mg/dL (ref 0.44–1.00)
GFR, Estimated: >60 mL/min (ref >60)
Glucose, Bld: 107 mg/dL — ABNORMAL HIGH (ref 70–99)
Potassium: 3.9 mmol/L (ref 3.5–5.1)
Sodium: 138 mmol/L (ref 135–145)

## 2021-02-16 ENCOUNTER — Telehealth (HOSPITAL_COMMUNITY): Payer: Self-pay | Admitting: *Deleted

## 2021-02-16 ENCOUNTER — Other Ambulatory Visit: Payer: Self-pay

## 2021-02-16 ENCOUNTER — Encounter (HOSPITAL_COMMUNITY): Payer: Self-pay | Admitting: Internal Medicine

## 2021-02-16 ENCOUNTER — Encounter (HOSPITAL_COMMUNITY): Payer: Self-pay

## 2021-02-16 ENCOUNTER — Inpatient Hospital Stay (HOSPITAL_COMMUNITY)
Admission: EM | Admit: 2021-02-16 | Discharge: 2021-02-19 | DRG: 227 | Disposition: A | Discharge status: Home / Self Care | Payer: BC Managed Care – PPO | Attending: Internal Medicine | Admitting: Internal Medicine

## 2021-02-16 DIAGNOSIS — Z83438 Family history of other disorder of lipoprotein metabolism and other lipidemia: Secondary | ICD-10-CM

## 2021-02-16 DIAGNOSIS — I499 Cardiac arrhythmia, unspecified: Secondary | ICD-10-CM

## 2021-02-16 DIAGNOSIS — Z7984 Long term (current) use of oral hypoglycemic drugs: Secondary | ICD-10-CM

## 2021-02-16 DIAGNOSIS — Z79899 Other long term (current) drug therapy: Secondary | ICD-10-CM

## 2021-02-16 DIAGNOSIS — Z95810 Presence of automatic (implantable) cardiac defibrillator: Secondary | ICD-10-CM

## 2021-02-16 DIAGNOSIS — Z20822 Contact with and (suspected) exposure to covid-19: Secondary | ICD-10-CM | POA: Diagnosis present

## 2021-02-16 DIAGNOSIS — I420 Dilated cardiomyopathy: Secondary | ICD-10-CM | POA: Diagnosis present

## 2021-02-16 DIAGNOSIS — E739 Lactose intolerance, unspecified: Secondary | ICD-10-CM | POA: Diagnosis present

## 2021-02-16 DIAGNOSIS — I4721 Torsades de pointes: Secondary | ICD-10-CM

## 2021-02-16 DIAGNOSIS — I5021 Acute systolic (congestive) heart failure: Secondary | ICD-10-CM | POA: Diagnosis not present

## 2021-02-16 DIAGNOSIS — I493 Ventricular premature depolarization: Secondary | ICD-10-CM | POA: Diagnosis present

## 2021-02-16 DIAGNOSIS — I472 Ventricular tachycardia: Principal | ICD-10-CM | POA: Diagnosis present

## 2021-02-16 DIAGNOSIS — I5022 Chronic systolic (congestive) heart failure: Secondary | ICD-10-CM | POA: Diagnosis present

## 2021-02-16 DIAGNOSIS — Z8616 Personal history of COVID-19: Secondary | ICD-10-CM

## 2021-02-16 DIAGNOSIS — I428 Other cardiomyopathies: Secondary | ICD-10-CM

## 2021-02-16 HISTORY — DX: Torsades de pointes: I47.21

## 2021-02-16 HISTORY — DX: Ventricular tachycardia: I47.2

## 2021-02-16 HISTORY — DX: Other cardiomyopathies: I42.8

## 2021-02-16 HISTORY — DX: Ventricular premature depolarization: I49.3

## 2021-02-16 LAB — COMPREHENSIVE METABOLIC PANEL
ALT: 14 U/L (ref 0–44)
AST: 18 U/L (ref 15–41)
Albumin: 4 g/dL (ref 3.5–5.0)
Alkaline Phosphatase: 42 U/L (ref 38–126)
Anion gap: 8 (ref 5–15)
BUN: 11 mg/dL (ref 6–20)
CO2: 27 mmol/L (ref 22–32)
Calcium: 9.1 mg/dL (ref 8.9–10.3)
Chloride: 102 mmol/L (ref 98–111)
Creatinine, Ser: 0.86 mg/dL (ref 0.44–1.00)
GFR, Estimated: >60 mL/min (ref >60)
Glucose, Bld: 90 mg/dL (ref 70–99)
Potassium: 3.8 mmol/L (ref 3.5–5.1)
Sodium: 137 mmol/L (ref 135–145)
Total Bilirubin: 0.9 mg/dL (ref 0.3–1.2)
Total Protein: 7.5 g/dL (ref 6.5–8.1)

## 2021-02-16 LAB — RESP PANEL BY RT-PCR (FLU A&B, COVID) ARPGX2
Influenza A by PCR: NEGATIVE
Influenza B by PCR: NEGATIVE
SARS Coronavirus 2 by RT PCR: NEGATIVE

## 2021-02-16 LAB — MAGNESIUM: Magnesium: 2.2 mg/dL (ref 1.7–2.4)

## 2021-02-16 MED ORDER — ONDANSETRON HCL 4 MG/2ML IJ SOLN: 4.0000 mg | Freq: Four times a day (QID) | INTRAMUSCULAR | Status: DC | PRN

## 2021-02-16 MED ORDER — SODIUM CHLORIDE 0.9 % IV SOLN: 250.0000 mL | INTRAVENOUS | Status: DC | PRN

## 2021-02-16 MED ORDER — CARVEDILOL 6.25 MG PO TABS
6.2500 mg | ORAL_TABLET | Freq: Two times a day (BID) | ORAL | Status: DC
  Administered 2021-02-17 – 2021-02-18 (×3): 6.25 mg via ORAL
  Filled 2021-02-16 (×2): qty 1
  Filled 2021-02-16: qty 2

## 2021-02-16 MED ORDER — SODIUM CHLORIDE 0.9% FLUSH: 3.0000 mL | INTRAVENOUS | Status: DC | PRN

## 2021-02-16 MED ORDER — EMPAGLIFLOZIN 10 MG PO TABS
10.0000 mg | ORAL_TABLET | Freq: Every day | ORAL | Status: DC
  Administered 2021-02-17: 10 mg via ORAL
  Filled 2021-02-16: qty 1

## 2021-02-16 MED ORDER — SACUBITRIL-VALSARTAN 24-26 MG PO TABS
1.0000 | ORAL_TABLET | Freq: Two times a day (BID) | ORAL | Status: DC
  Administered 2021-02-16 – 2021-02-18 (×4): 1 via ORAL
  Filled 2021-02-16 (×5): qty 1

## 2021-02-16 MED ORDER — SODIUM CHLORIDE 0.9% FLUSH
3.0000 mL | Freq: Two times a day (BID) | INTRAVENOUS | Status: DC
  Administered 2021-02-16 – 2021-02-17 (×3): 3 mL via INTRAVENOUS

## 2021-02-16 MED ORDER — POTASSIUM CHLORIDE CRYS ER 20 MEQ PO TBCR
40.0000 meq | EXTENDED_RELEASE_TABLET | Freq: Once | ORAL | Status: AC
  Administered 2021-02-16: 40 meq via ORAL
  Filled 2021-02-16: qty 2

## 2021-02-16 MED ORDER — ACETAMINOPHEN 325 MG PO TABS
650.0000 mg | ORAL_TABLET | ORAL | Status: DC | PRN
  Administered 2021-02-16: 650 mg via ORAL
  Filled 2021-02-16: qty 2

## 2021-02-16 MED ORDER — SPIRONOLACTONE 12.5 MG HALF TABLET
12.5000 mg | ORAL_TABLET | Freq: Every day | ORAL | Status: DC
  Administered 2021-02-16 – 2021-02-17 (×2): 12.5 mg via ORAL
  Filled 2021-02-16 (×3): qty 1

## 2021-02-16 NOTE — H&P (Addendum)
Cardiology Admission History and Physical:   Patient ID: [name] MRN: [id number]; DOB: May 16, 1996   Admission date: 02/16/2021  PCP:  Patient, [name]   [facility]  Cardiologist:  Little Ishikawa, MD  Advanced Practice Provider:  No care team member to display Electrophysiologist:  None  Advanced Heart Failure Clinic:  Arvilla Meres, MD    {  Chief Complaint: Torsades noted on outpatient monitor  Patient Profile:   [name] is a 25 y.o. female with past medical history of recently diagnosed systolic heart failure EF 15 to 20% and PVCs who presents to the ED after turning in a cardiac monitor which reported a total of 27-seconds of torsades.   History of Present Illness:   [name] is a 25 year old female with past medical history noted above.  She was recently diagnosed with systolic heart failure and referred to Dr. Gala Romney by Dr. Bjorn Pippin as an outpatient.  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  Initially noted symptoms of lightheadedness in September 2021.  Subsequently had COVID-19 infection in October 2021 and noted worsening shortness of breath, palpitations and lightheadedness.  Presented to the ED in [place] on 11/11/2020 with chest pain and palpitations.  CT chest showed no evidence of PE.  High-sensitivity troponins were elevated (72>> 73>> 68) but flat.  She was discharged from the ED with referral to cardiology for further evaluation.  She saw Dr. Sela Hua in [facility] on 11/19/2020 and echocardiogram and Holter monitor were ordered.  Echo showed severe LV dilation, severe systolic dysfunction with an EF of 15 to 20%, normal RV function and no significant valvular disease.  Holter monitor showed rare ectopy and 6 beat run of NSVT.  Given findings she was set up for a Lexiscan Myoview on 01/04/2021 which showed anterior  perfusion defect concerning for ischemia and an EF of 26%.  Back to the ED on 01/09/2021 and underwent cardiac catheterization shortly thereafter that showed normal coronaries and normal LVEDP.  Recently was seen by Dr. Bjorn Pippin and was ordered for cardiac MRI.  This showed an EF of 31%, no evidence for late gadolinium enhancement.  Normal RV.  She was referred to advanced heart failure and seen was by Dr. Gala Romney on 01/27/2021.  Reported feeling better but still had runs of PVCs.  Was able to take frequent walks and ride her exercise bike.  Would get short of breath from time to time going up stairs.  Still complained of dizziness with standing.  Her medications were adjusted to increase her carvedilol to 6.25 mg twice daily, continue Entresto 24/26 twice daily, continue Jardiance 10 mg daily, and add spironolactone 12.5 mg daily.  She was ordered for another heart monitor to evaluate for arrhythmia.  Instructed to avoid caffeine.  Courage to continue moderate physical activity but no strenuous.  She presented to the ED on 11/12/2020 with complaints of palpitations, and "feeling unwell".  Also complained of a fluttering in her chest and dizziness.  She was observed in the ER and noted to have PVCs on telemetry but no significant arrhythmias.  Instructed to continue wearing her cardiac monitor.   Advanced heart failure clinic received a call from [facility] today stating patient had multiple episodes of VT and torsades.  Patient and her mother were called the ED for admission.  At time of evaluation the ED patient is comfortable, no specific complaints.  Noted  to be in sinus tachycardia with rates in the low 100s on telemetry.  EKG shows sinus tachycardia, 105 bpm slight ST elevation in lead III with minimal ST depression noted in lateral leads.  Her mother is at the bedside.  Admission labs are pending at the time of assessment.  Reports being compliant with all of her home medications.  She is not had any episodes  of syncope since her last visit in the office.  Does continue to have intermittent episodes of dizziness.  Past Medical History:  Diagnosis Date  . Lactose intolerance     History reviewed. No pertinent surgical history.   Medications Prior to Admission: Prior to Admission medications   Medication Sig Start Date End Date Taking? Authorizing Provider  carvedilol (COREG) 6.25 MG tablet Take 1 tablet (6.25 mg total) by mouth in the morning and at bedtime. 01/27/21   Bensimhon, Bevelyn Buckles, MD  empagliflozin (JARDIANCE) 10 MG TABS tablet Take 10 mg by mouth daily. 01/11/21 01/11/22  [provider]  ENTRESTO 24-26 MG Take 1 tablet by mouth 2 (two) times daily. 01/23/21   [provider]  spironolactone (ALDACTONE) 25 MG tablet Take 0.5 tablets (12.5 mg total) by mouth at bedtime. 01/27/21   Bensimhon, Bevelyn Buckles, MD  TRI-ESTARYLLA 0.18/0.215/0.25 MG-35 MCG tablet Take 1 tablet by mouth daily. 11/12/20   [provider]  valACYclovir (VALTREX) 1000 MG tablet Take 1,000 mg by mouth as needed for rash. 09/06/20   [provider]     Allergies:   No Known Allergies  Social History:   Social History   Socioeconomic History  . Marital status: Single    Spouse name: Not on file  . Number of children: Not on file  . Years of education: Not on file  . Highest education level: Not on file  Occupational History  . Not on file  Tobacco Use  . Smoking status: Never Smoker  . Smokeless tobacco: Never Used  Substance and Sexual Activity  . Alcohol use: No  . Drug use: No  . Sexual activity: Never  Other Topics Concern  . Not on file  Social History Narrative   [employer]   10th grade.   volleyball   Social Determinants of Corporate investment banker Strain: Not on file  Food Insecurity: Not on file  Transportation Needs: Not on file  Physical Activity: Not on file  Stress: Not on file  Social Connections: Not on file  Intimate Partner Violence: Not  on file    Family History:   The patient's family history includes Hyperlipidemia in her maternal grandfather.    ROS:  Please see the history of present illness.  All other ROS reviewed and negative.     Physical Exam/Data:   Vitals:   02/16/21 1856 02/16/21 1915  BP: 129/82 117/77  Pulse: 100 97  Resp: (!) 25 (!) 23  Temp: 99.5 F (37.5 C)   TempSrc: Oral   SpO2: 99% 100%  Weight:  82 kg  Height:  5\' 1"  (1.549 m)   No intake or output data in the 24 hours ending 02/16/21 2010 Last 3 Weights 02/16/2021 02/10/2021 01/27/2021  Weight (lbs) 180 lb 12.4 oz 180 lb 184 lb 9.6 oz  Weight (kg) 82 kg 81.647 kg 83.734 kg     Body mass index is 34.16 kg/m.  General:  Well nourished, well developed, in no acute distress HEENT: normal Lymph: no adenopathy Neck: no JVD Endocrine:  No thryomegaly Vascular:  No carotid bruits; FA pulses 2+ bilaterally without bruits  Cardiac:  normal S1, S2; RRR; no murmur  Lungs:  clear to auscultation bilaterally, no wheezing, rhonchi or rales  Abd: soft, nontender, no hepatomegaly  Ext: no edema Musculoskeletal:  No deformities, BUE and BLE strength normal and equal Skin: warm and dry  Neuro:  CNs 2-12 intact, no focal abnormalities noted Psych:  Normal affect    EKG:  The ECG that was done 02/16/2021 was personally reviewed and demonstrates sinus tachycardia, 105 bpm, slight ST elevation in lead III, with ST depression in lateral leads  Relevant CV Studies:  Cardiac MRI:  01/26/2021  IMPRESSION: 1. Severe LV dilatation (EDVI 167 mL/m^2) with moderate systolic dysfunction (EF 31%)  2.  Mild RV dilatation with normal systolic function (EF 55%)  3.  No late gadolinium enhancement  4.  Small pericardial effusion   Electronically Signed   By: Epifanio Lesches MD   On: 01/28/2021 00:17   Laboratory Data:  High Sensitivity Troponin:   Recent Labs  Lab 02/10/21 1318 02/10/21 1830  TROPONINIHS 15 15      Chemistry Recent  Labs  Lab 02/10/21 1318 02/14/21 0933  NA 137 138  K 4.1 3.9  CL 104 103  CO2 25 26  GLUCOSE 99 107*  BUN 7 9  CREATININE 0.88 0.78  CALCIUM 9.3 8.9  GFRNONAA >60 >60  ANIONGAP 8 9    No results for input(s): PROT, ALBUMIN, AST, ALT, ALKPHOS, BILITOT in the last 168 hours. Hematology Recent Labs  Lab 02/10/21 1318  WBC 6.0  RBC 4.81  HGB 13.6  HCT 42.0  MCV 87.3  MCH 28.3  MCHC 32.4  RDW 13.9  PLT 389   BNP Recent Labs  Lab 02/10/21 1319  BNP 44.4    DDimer No results for input(s): DDIMER in the last 168 hours.   Radiology/Studies:  No results found.   Assessment and Plan:   [name] is a 25 y.o. female with past medical history of recently diagnosed systolic heart failure EF 15 to 20% and PVCs who presents to the ED after turning in a cardiac monitor which reported a total of 27-seconds of torsades.   1.  Palpations/dizziness with Torsades in the setting of NICM: Patient with known EF of 15 to 20%, with recent cardiac MRI noting improvement to 31%.  Outpatient monitor reported back today with 6 episodes of torsades totaling 27 seconds.  Patient called and instructed to come to the ED for admission with plans for EP consult in a.m. -- Admit to 2c --Admission labs pending at time of assessment, supplement electrolytes as needed --We will make n.p.o. in the event plan is for device placement -- Zoll at the bedside  2.  Newly diagnosed acute systolic heart failure, nonischemic: Diagnosed with Covid back in October 2021.  Has undergone ischemic work-up in [place] with cardiac catheterization noting no CAD.  As above recent cardiac MRI with no late gadolinium enhancement.  She has been started on guideline directed medical therapy --Continue Coreg 6.25 mg daily, Entresto 24/26, Jardiance 10 mg daily and spironolactone 12.5 mg daily  3.  PVCs: Continue beta-blocker --Electrolytes pending as above   Risk Assessment/Risk Scores:     Severity of  Illness: The appropriate patient status for this patient is OBSERVATION. Observation status is judged to be reasonable and necessary in order to provide the required intensity of service to ensure the patient's safety. The patient's presenting symptoms, physical exam  findings, and initial radiographic and laboratory data in the context of their medical condition is felt to place them at decreased risk for further clinical deterioration. Furthermore, it is anticipated that the patient will be medically stable for discharge from the hospital within 2 midnights of admission. The following factors support the patient status of observation.   " The patient's presenting symptoms include palpitations, dizziness. " The physical exam findings include stable exam. " The initial radiographic and laboratory data are pending labs.     For questions or updates, please contact [facility] Please consult [email or web address] for contact info under     Signed, Laverda Page, NP  02/16/2021 8:10 PM    Attending note  Patient seen and discussed with PA Su Hilt, I agree with her documentation. 25 yo female history of chronic systolic HF with recent workup in [place] with echo LVEF 15-20% and cath without significant coronary disease. cMRI 01/26/21 LVEF 31% markedly dilated No significant LGE. RV normal. No non-compaction or other pathology. Ongoing evaluation and management by the advanced heart failure team. As part of recent visit was set up with zio patch, which today showed episodes up to 27 seconds. Patient was asked to present to ER for evaluation by cardiology and EP. She reports some dizzienss at home at times, no syncope.    Labs are pending  EKG SR, QTc 480.    Episodes of torsades by home zio patch in patient with known dilated cardiomyopathy. Recent cMRI without significant infiltrative or inflammatory findings of myocardium. QTc is not impressively prolonged, not on any QTc prolonging  medsDiscussed with Dr Gala Romney, admit patient to [facility] for overnight observation to HF team with EP evaluation in AM.I will follow up on pending labs to be sure electrolytes are not an issue, low threshold for replacement overnight. NPO at midnight incase needs ICD.    Dina Rich MD

## 2021-02-16 NOTE — ED Provider Notes (Signed)
[facility] EMERGENCY DEPARTMENT Provider Note   CSN: [id number] Arrival date & time: 02/16/21  1843     History Chief Complaint  Patient presents with  . Abnormal ECG    [name] is a 25 y.o. female.  Patient with history of dilated cardiomyopathy, presents with arrhythmia noted on her cardiac monitor at home.  She has been having arrhythmias off and on for the past few weeks.  She has had a Holter monitor on for about 2 weeks.  She was seen in the ER just a few days ago with palpitations again and ultimately discharged home.  She was called by her cardiologist today after interrogation of her device that she had significant arrhythmias and she would need to be admitted to the ER again.  Patient otherwise denies any fever cough vomiting or diarrhea.  She had an episode of chest pain yesterday that has since resolved.  Loss of consciousness or dizziness.        Past Medical History:  Diagnosis Date  . Lactose intolerance     Patient Active Problem List   Diagnosis Date Noted  . Warts 09/04/2012    No past surgical history on file.   OB History   No obstetric history on file.     Family History  Problem Relation Age of Onset  . Hyperlipidemia Maternal Grandfather     Social History   Tobacco Use  . Smoking status: Never Smoker  . Smokeless tobacco: Never Used  Substance Use Topics  . Alcohol use: No  . Drug use: No    Home Medications Prior to Admission medications   Medication Sig Start Date End Date Taking? Authorizing Provider  carvedilol (COREG) 6.25 MG tablet Take 1 tablet (6.25 mg total) by mouth in the morning and at bedtime. 01/27/21   Bensimhon, Bevelyn Buckles, MD  empagliflozin (JARDIANCE) 10 MG TABS tablet Take 10 mg by mouth daily. 01/11/21 01/11/22  [provider]  ENTRESTO 24-26 MG Take 1 tablet by mouth 2 (two) times daily. 01/23/21   [provider]  spironolactone (ALDACTONE) 25 MG tablet Take 0.5 tablets  (12.5 mg total) by mouth at bedtime. 01/27/21   Bensimhon, Bevelyn Buckles, MD  TRI-ESTARYLLA 0.18/0.215/0.25 MG-35 MCG tablet Take 1 tablet by mouth daily. 11/12/20   [provider]  valACYclovir (VALTREX) 1000 MG tablet Take 1,000 mg by mouth as needed for rash. 09/06/20   [provider]    Allergies    Patient has no known allergies.  Review of Systems   Review of Systems  Constitutional: Negative for fever.  HENT: Negative for ear pain.   Eyes: Negative for pain.  Respiratory: Negative for cough.   Cardiovascular: Positive for palpitations. Negative for chest pain.  Gastrointestinal: Negative for abdominal pain.  Genitourinary: Negative for flank pain.  Musculoskeletal: Negative for back pain.  Skin: Negative for rash.  Neurological: Negative for headaches.    Physical Exam Updated Vital Signs BP 129/82 (BP Location: Right Arm)   Pulse 100   Temp 99.5 F (37.5 C) (Oral)   Resp (!) 25   SpO2 99%   Physical Exam Constitutional:      General: She is not in acute distress.    Appearance: Normal appearance.  HENT:     Head: Normocephalic.     Nose: Nose normal.  Eyes:     Extraocular Movements: Extraocular movements intact.  Cardiovascular:     Rate and Rhythm: Normal rate.  Pulmonary:  Effort: Pulmonary effort is normal.  Musculoskeletal:        General: Normal range of motion.     Cervical back: Normal range of motion.  Neurological:     General: No focal deficit present.     Mental Status: She is alert and oriented to person, place, and time. Mental status is at baseline.     ED Results / Procedures / Treatments   Labs (all labs ordered are listed, but only abnormal results are displayed) Labs Reviewed  RESP PANEL BY RT-PCR (FLU A&B, COVID) ARPGX2  COMPREHENSIVE METABOLIC PANEL  MAGNESIUM    EKG None  Radiology No results found.  Procedures Procedures   Medications Ordered in ED Medications - No data to display  ED Course  I  have reviewed the triage vital signs and the nursing notes.  Pertinent labs & imaging results that were available during my care of the patient were reviewed by me and considered in my medical decision making (see chart for details).  Clinical Course as of 02/16/21 1912  Wed Feb 16, 2021  1909 D/w cardiolgy for admission  [JH]    Clinical Course User Index [JH] Audley Hose, Eustace Moore, MD   MDM Rules/Calculators/A&P                          Patient presents with mild tachycardia otherwise normal vital signs.  She is in no discomfort or pain.  Patient continues on cardiac monitoring.  Case discussed with cardiology, recommending admission to their service.   Final Clinical Impression(s) / ED Diagnoses Final diagnoses:  Cardiac arrhythmia, unspecified cardiac arrhythmia type    Rx / DC Orders ED Discharge Orders    None       Cheryll Cockayne, MD 02/16/21 1912

## 2021-02-16 NOTE — Telephone Encounter (Addendum)
Received call from [employer], pt's monitor report very abnormal with multiple episodes of VT and torsades, per Dr Gala Romney pt needs to come to ER. Spoke w/pt and her mom, they are aware and will go to ER now.

## 2021-02-16 NOTE — ED Notes (Signed)
Dr Gala Romney called and reports that patient has newly diagnosed cardiomyopathy and had 27 second run of torsades. Cardiology to be called on arrival for admission.

## 2021-02-16 NOTE — Progress Notes (Signed)
  Alert called from [facility] Patch. Showed 27 sec run of torsades that self terminated. Will have patient come to ER for admission. Will need to discuss with EP regarding LifeVest vs ICD. Legrand Rams the latter)   Arvilla Meres, MD

## 2021-02-16 NOTE — ED Triage Notes (Signed)
Pt arrives POV as referral from cardiologist office. Pt reportedly dx'd w/ DCM in January, has been wearing holter monitor since that time. This week, cards called her and advised her to present here for 27 beat run of Torsades. Pt endorses substernal chest pain intermittently yesterday, non exertional. Denies any other complaints at this time. Normal sinus on this monitor at this time

## 2021-02-17 ENCOUNTER — Other Ambulatory Visit: Payer: Self-pay

## 2021-02-17 ENCOUNTER — Observation Stay (HOSPITAL_BASED_OUTPATIENT_CLINIC_OR_DEPARTMENT_OTHER): Payer: BC Managed Care – PPO

## 2021-02-17 DIAGNOSIS — I5022 Chronic systolic (congestive) heart failure: Secondary | ICD-10-CM | POA: Diagnosis not present

## 2021-02-17 DIAGNOSIS — I499 Cardiac arrhythmia, unspecified: Secondary | ICD-10-CM | POA: Diagnosis not present

## 2021-02-17 DIAGNOSIS — I472 Ventricular tachycardia: Secondary | ICD-10-CM | POA: Diagnosis not present

## 2021-02-17 DIAGNOSIS — I428 Other cardiomyopathies: Secondary | ICD-10-CM

## 2021-02-17 LAB — BASIC METABOLIC PANEL
Anion gap: 8 (ref 5–15)
BUN: 10 mg/dL (ref 6–20)
CO2: 24 mmol/L (ref 22–32)
Calcium: 9 mg/dL (ref 8.9–10.3)
Chloride: 104 mmol/L (ref 98–111)
Creatinine, Ser: 0.74 mg/dL (ref 0.44–1.00)
GFR, Estimated: >60 mL/min (ref >60)
Glucose, Bld: 92 mg/dL (ref 70–99)
Potassium: 4.1 mmol/L (ref 3.5–5.1)
Sodium: 136 mmol/L (ref 135–145)

## 2021-02-17 LAB — ECHOCARDIOGRAM COMPLETE
Area-P 1/2: 2.62 cm2
Height: 66 in
MV M vel: 4.63 m/s
MV Peak grad: 85.7 mmHg
Radius: 0.2 cm
S' Lateral: 6.1 cm
Weight: 2880 oz

## 2021-02-17 LAB — SURGICAL PCR SCREEN
MRSA, PCR: NEGATIVE
Staphylococcus aureus: NEGATIVE

## 2021-02-17 MED ORDER — SODIUM CHLORIDE 0.9 % IV SOLN
250.0000 mL | INTRAVENOUS | Status: DC
Start: 2021-02-17 — End: 2021-02-18

## 2021-02-17 MED ORDER — SODIUM CHLORIDE 0.9 % IV SOLN: INTRAVENOUS | Status: DC

## 2021-02-17 MED ORDER — CEFAZOLIN SODIUM-DEXTROSE 2-4 GM/100ML-% IV SOLN
2.0000 g | INTRAVENOUS | Status: AC
  Administered 2021-02-18: 2 g via INTRAVENOUS

## 2021-02-17 MED ORDER — CHLORHEXIDINE GLUCONATE 4 % EX LIQD: 60.0000 mL | Freq: Once | CUTANEOUS | Status: DC

## 2021-02-17 MED ORDER — CHLORHEXIDINE GLUCONATE 4 % EX LIQD
60.0000 mL | Freq: Once | CUTANEOUS | Status: AC
  Administered 2021-02-17: 4 via TOPICAL
  Filled 2021-02-17: qty 60

## 2021-02-17 MED ORDER — NORGESTIM-ETH ESTRAD TRIPHASIC 0.18/0.215/0.25 MG-35 MCG PO TABS
1.0000 | ORAL_TABLET | Freq: Every day | ORAL | Status: DC
  Administered 2021-02-18: 1 via ORAL

## 2021-02-17 MED ORDER — SODIUM CHLORIDE 0.9% FLUSH: 3.0000 mL | INTRAVENOUS | Status: DC | PRN

## 2021-02-17 MED ORDER — SODIUM CHLORIDE 0.9% FLUSH: 3.0000 mL | Freq: Two times a day (BID) | INTRAVENOUS | Status: DC

## 2021-02-17 MED ORDER — CHLORHEXIDINE GLUCONATE 4 % EX LIQD
60.0000 mL | Freq: Once | CUTANEOUS | Status: AC
  Administered 2021-02-18: 4 via TOPICAL
  Filled 2021-02-17: qty 60

## 2021-02-17 MED ORDER — SODIUM CHLORIDE 0.9 % IV SOLN: 80.0000 mg | INTRAVENOUS | Status: DC

## 2021-02-17 NOTE — Consult Note (Addendum)
Advanced Heart Failure Team Consult Note   Primary Physician: Patient, [name] PCP-Cardiologist:  Donato Heinz, MD  Reason for Consultation: Heart Failure  HPI:    [name] is seen today for evaluation of heart failure at the request of Dr Harl Bowie.   [name] is a 25 year old with a history of  hx of recently diagnosed systolic HF and had COVID 07/2020 .  She reportsthat she first started noticing lightheadedness in September 2021. Subsequently had COVID-19 infection in October (vaccianted not boosted) and noted worsening shortness of breath. palpitations and lightheadedness since that time.    She presented to the ED in [place] on 11/11/2020 with chest pain and palpitations. Chest CT was done and showed no evidence of PE. High-sensitivity troponins were elevated but flat (72 >73 >68).She was discharged from the ED but given elevated troponin was referred to cardiology for evaluation. She saw Dr. Jinger Neighbors in [facility] on 11/19/2020 and echocardiogram and Holter monitor were ordered. Echocardiogram on 12/03/2020 showed severe LV dilatation, severe systolic dysfunction (EF 15 to 20%), normal RV function, no significant valvular disease. Holter monitor showed rare ectopy and 6 beatsrunof NSVT. Given her systolic dysfunction, Lexiscan Myoview was done on 01/04/2021, which showed anterior perfusion defect concerning for ischemia, EF 26%. She presented back to the ED with chest pain on 01/09/2021. Cardiac catheterization on 01/10/2021 showed normal coronary arteries, normal LVEDP. She has been started on GDMT, but has been limited by soft blood pressures.   Saw Dr. Gardiner Rhyme with HF meds titrated. cMRI ordered and referred here for further evaluation.  cMRI 01/26/21 LVEF 31% markedly dilated No significant LGE. RV normal. No non-compaction or other pathology  She saw Dr Haroldine Laws 01/27/21 as new patient and had Zio Monitor placed  due to frequent PVCs.   On 02/10/21 she was evaluated in the ED for palpitations. Labs were ok. She was later discharged to home.   Zio- 6 PVT/VF/TdP episodes occurred, the longest lasting 9 secs. 27 Ventricular Tachycardia runs occurred, the run with the fastest interval lasting 6 beats with a max rate of 272 bpm, the longest lasting 7 beats with an avg rate of 125 bpm. 1 run of Supraventricular Tachycardia occurred lasting 6 beats with a max rate of 118 bpm (avg 102 bpm). Ventricular Tachycardia and Run of VEs was detected within +/- 45 seconds of symptomatic patient event(s). Isolated SVEs were rare (<1.0%), SVE Couplets were rare (<1.0%), and SVE Triplets were rare (<1.0%). Isolated VEs were occasional (1.0%, 14899), VE Couplets were rare (<1.0%, 266), and VE Triplets were rare (<1.0%, 34). Ventricular Bigeminy was present. MD notification criteria for Torsades de Pointes met - report posted prior to notification per account request (LV).    Yesterday HF clinic received call from [place] due to multiple episodes of VT and torsades. Dr Lorrin Mais reviewed and she was instructed to go to the ED. Pertinent labs included:  K 3.8, Mag 2.2, and Sars 2 negative. EKG on arrival with SR.   Has had ongoing shortness of breath with exertion, fatigue, palpitations. She reports frequent presyncope associated with chest heaviness. Feels ok today. Hungry. Denies palpitations.     Review of Systems: [y] = yes, [ ]  = no   . General: Weight gain [ ] ; Weight loss [ ] ; Anorexia [ ] ; Fatigue [ Y]; Fever [ ] ; Chills [ ] ; Weakness [ ]   . Cardiac: Chest pain/pressure [Y ]; Resting SOB [ ] ; Exertional SOB [  Y ]; Orthopnea [ ] ; Pedal Edema [ ] ; Palpitations [ Y]; Syncope [ ] ; Presyncope [Y ]; Paroxysmal nocturnal dyspnea[ ]   . Pulmonary: Cough [ ] ; Wheezing[ ] ; Hemoptysis[ ] ; Sputum [ ] ; Snoring [ ]   . GI: Vomiting[ ] ; Dysphagia[ ] ; Melena[ ] ; Hematochezia [ ] ; Heartburn[ ] ; Abdominal pain [ ] ; Constipation [ ] ; Diarrhea [  ]; BRBPR [ ]   . GU: Hematuria[ ] ; Dysuria [ ] ; Nocturia[ ]   . Vascular: Pain in legs with walking [ ] ; Pain in feet with lying flat [ ] ; Non-healing sores [ ] ; Stroke [ ] ; TIA [ ] ; Slurred speech [ ] ;  . Neuro: Headaches[ ] ; Vertigo[ ] ; Seizures[ ] ; Paresthesias[ ] ;Blurred vision [ ] ; Diplopia [ ] ; Vision changes [ ]   . Ortho/Skin: Arthritis [ ] ; Joint pain [ ] ; Muscle pain [ ] ; Joint swelling [ ] ; Back Pain [ ] ; Rash [ ]   . Psych: Depression[ ] ; Anxiety[ ]   . Heme: Bleeding problems [ ] ; Clotting disorders [ ] ; Anemia [ ]   . Endocrine: Diabetes [ ] ; Thyroid dysfunction[ ]   Home Medications Prior to Admission medications   Medication Sig Start Date End Date Taking? Authorizing Provider  carvedilol (COREG) 6.25 MG tablet Take 1 tablet (6.25 mg total) by mouth in the morning and at bedtime. 01/27/21  Yes Loukas Antonson, Shaune Pascal, MD  empagliflozin (JARDIANCE) 10 MG TABS tablet Take 10 mg by mouth daily. 01/11/21 01/11/22 Yes [provider]  ENTRESTO 24-26 MG Take 1 tablet by mouth 2 (two) times daily. 01/23/21  Yes [provider]  spironolactone (ALDACTONE) 25 MG tablet Take 0.5 tablets (12.5 mg total) by mouth at bedtime. 01/27/21  Yes Athina Fahey, Shaune Pascal, MD  TRI-ESTARYLLA 0.18/0.215/0.25 MG-35 MCG tablet Take 1 tablet by mouth daily. 11/12/20  Yes [provider]  valACYclovir (VALTREX) 1000 MG tablet Take 1,000 mg by mouth as needed for rash. 09/06/20  Yes [provider]    Past Medical History: Past Medical History:  Diagnosis Date  . Lactose intolerance     Past Surgical History: History reviewed. No pertinent surgical history.  Family History: Family History  Problem Relation Age of Onset  . Hyperlipidemia Maternal Grandfather     Social History: Social History   Socioeconomic History  . Marital status: Single    Spouse name: Not on file  . Number of children: Not on file  . Years of education: Not on file  . Highest education level: Not on file   Occupational History  . Not on file  Tobacco Use  . Smoking status: Never Smoker  . Smokeless tobacco: Never Used  Substance and Sexual Activity  . Alcohol use: No  . Drug use: No  . Sexual activity: Never  Other Topics Concern  . Not on file  Social History Narrative   [employer] highschool   10th grade.   volleyball   Social Determinants of Radio broadcast assistant Strain: Not on file  Food Insecurity: Not on file  Transportation Needs: Not on file  Physical Activity: Not on file  Stress: Not on file  Social Connections: Not on file    Allergies:  No Known Allergies  Objective:    Vital Signs:   Temp:  [98 F (36.7 C)-99.5 F (37.5 C)] 98.9 F (37.2 C) (04/14 0745) Pulse Rate:  [52-104] 76 (04/14 0900) Resp:  [11-25] 18 (04/14 0900) BP: (95-129)/(55-83) 114/68 (04/14 0900) SpO2:  [95 %-100 %] 98 % (04/14 0900) Weight:  [82 kg] 82 kg (04/13  1915)    Weight change: Filed Weights   02/16/21 1915  Weight: 82 kg    Intake/Output:  No intake or output data in the 24 hours ending 02/17/21 0959    Physical Exam    General:  No resp difficulty HEENT: normal Neck: supple. JVP does not appear elevated.  . Carotids 2+ bilat; no bruits. No lymphadenopathy or thyromegaly appreciated. Cor: PMI nondisplaced. Regular rate & rhythm. No rubs, gallops or murmurs. Lungs: clear Abdomen: soft, nontender, nondistended. No hepatosplenomegaly. No bruits or masses. Good bowel sounds. Extremities: no cyanosis, clubbing, rash, edema Neuro: alert & orientedx3, cranial nerves grossly intact. moves all 4 extremities w/o difficulty. Affect pleasant   Telemetry   SR with brief NSVT  EKG    4/13 Sinus Tach 105   Labs   Basic Metabolic Panel: Recent Labs  Lab 02/10/21 1318 02/10/21 1830 02/14/21 0933 02/16/21 1854 02/17/21 0500  NA 137  --  138 137 136  K 4.1  --  3.9 3.8 4.1  CL 104  --  103 102 104  CO2 25  --  26 27 24   GLUCOSE 99  --  107* 90 92  BUN 7   --  9 11 10   CREATININE 0.88  --  0.78 0.86 0.74  CALCIUM 9.3  --  8.9 9.1 9.0  MG  --  2.2  --  2.2  --     Liver Function Tests: Recent Labs  Lab 02/16/21 1854  AST 18  ALT 14  ALKPHOS 42  BILITOT 0.9  PROT 7.5  ALBUMIN 4.0   No results for input(s): LIPASE, AMYLASE in the last 168 hours. No results for input(s): AMMONIA in the last 168 hours.  CBC: Recent Labs  Lab 02/10/21 1318  WBC 6.0  HGB 13.6  HCT 42.0  MCV 87.3  PLT 389    Cardiac Enzymes: No results for input(s): CKTOTAL, CKMB, CKMBINDEX, TROPONINI in the last 168 hours.  BNP: BNP (last 3 results) Recent Labs    02/10/21 1319  BNP 44.4    ProBNP (last 3 results) No results for input(s): PROBNP in the last 8760 hours.   CBG: No results for input(s): GLUCAP in the last 168 hours.  Coagulation Studies: No results for input(s): LABPROT, INR in the last 72 hours.   Imaging    No results found.   Medications:     Current Medications: . carvedilol  6.25 mg Oral BID WC  . empagliflozin  10 mg Oral Daily  . sacubitril-valsartan  1 tablet Oral BID  . sodium chloride flush  3 mL Intravenous Q12H  . spironolactone  12.5 mg Oral QHS     Infusions: . sodium chloride         Assessment/Plan   1. Torsades/VT Reported on Zio multiple episodes NSVT and had Torsades.  EP consulted. ? ICD versus Life Vest - May need to start amiodarone.  - K and Mag ok. Aim to keep K >4 and Mag >2 - No driving for 6 months.   2. Chronic HFrEF due to NICM. Suspect viral (? COVID) myocarditis - Echo 1/22 EF 15- 20% - Holter monitor 1/22 showed rare ectopy and 6 beatsrunof NSVT.  Carlton Adam Myoview 3/22 EF 26% with anterior perfusion defect concerning for ischemia - Cardiac catheterization 01/10/2021 in [place] showed normal coronary arteries, normal LVEDP.  - cMRI 01/26/21 LVEF 31% markedly dilated No significant LGE. RV normal. No non-compaction or other pathology - Volume status stable. Does not  need IV  diuretics - Continue current dose of coreg, entresto, spironolactone, and jardiance.   Admit to progressive care.    Length of Stay: 0  Darrick Grinder, NP  02/17/2021, 9:59 AM  Advanced Heart Failure Team Pager [phone] (M-F; 7a - 5p)  Please contact [facility] for night-coverage after hours (4p -7a ) and weekends on [email or web address]  Patient seen and examined with the above-signed Advanced Practice Provider and/or Housestaff. I personally reviewed laboratory data, imaging studies and relevant notes. I independently examined the patient and formulated the important aspects of the plan. I have edited the note to reflect any of my changes or salient points. I have personally discussed the plan with the patient and/or family.  23 you with systolic HF due to probable viral myocarditis. Admitted with presyncope and TdP which spontaneously terminated. Rhythm stable overnight. K and Mg ok   General:  Well appearing. No resp difficulty HEENT: normal Neck: supple. no JVD. Carotids 2+ bilat; no bruits. No lymphadenopathy or thryomegaly appreciated. Cor: PMI nondisplaced. Regular rate & rhythm. No rubs, gallops or murmurs. Lungs: clear Abdomen: soft, nontender, nondistended. No hepatosplenomegaly. No bruits or masses. Good bowel sounds. Extremities: no cyanosis, clubbing, rash, edema Neuro: alert & orientedx3, cranial nerves grossly intact. moves all 4 extremities w/o difficulty. Affect pleasant  Continue watch on tele. HF stable. Have asked EP to see for ICD.   Glori Bickers, MD  12:15 PM

## 2021-02-17 NOTE — Consult Note (Signed)
Cardiology Consultation:   Patient ID: [name] MRN: [id number]; DOB: 1996-02-09  Admit date: 02/16/2021 Date of Consult: 02/17/2021  PCP:  Patient, [name]   [place]  Cardiologist:  Donato Heinz, MD  HF: Dr. Haroldine Laws  Patient Profile:   [name] is a 25 y.o. female with a hx of NICM who is being seen today for the evaluation of torsades at the request of Dr. Haroldine Laws.  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  She presented to the ED in [place] on 11/11/2020 with chest pain and palpitations.  CTPA was done and showed no evidence of PE.  High-sensitivity troponins were elevated but flat (72 > 73 > 68).  She was discharged from the ED but given elevated troponin was referred to cardiology for evaluation.  She saw Dr. Jinger Neighbors in [facility] on  11/19/2020 and echocardiogram and Holter monitor were ordered.   Echocardiogram on 12/03/2020 showed severe LV dilatation, severe systolic dysfunction (EF 15 to 20%), normal RV function, no significant valvular disease.   Holter monitor showed rare ectopy and 6 beats run of NSVT.  Given her systolic dysfunction,  Lexiscan Myoview was done on 01/04/2021, which showed anterior perfusion defect concerning for ischemia, EF 26%.   She presented back to the ED with chest pain on 01/09/2021.   Cardiac catheterization on 01/10/2021 showed normal coronary arteries, normal LVEDP.  She was been started on GDMT, but has been limited by soft blood pressures.  She saw Dr. Gardiner Rhyme 1/69/67 and elicited hx of she first started noticing lightheadedness in September 2021.  Subsequently had COVID-19 infection in October and noted worsening shortness of breath and lightheadedness since that time.  Also noted intermittent palpitations.  Symptoms progressively worsened in December and January, prompting her to present to the ED on 1/6 (discussed  above).  Planned for c.MRI and AHF referral  She saw Dr. Haroldine Laws 01/27/21, noted her cMRI 01/26/21 LVEF 31% markedly dilated No significant LGE. RV normal. No non-compaction or other pathology Feels PVCs, was getting lightheaded with standing and when PVC burden was up, DOE with brisk paced activities Noted no family h/o of cardiomyopathy.  PGM had MI Her younger sister has POTS  Suspect perhaps viral NICM (?COVID) myocarditis Her coreg was uptitrated, spironolactone was added, planned to repeat echo at next visit and repeat her monitor   She went to the ER 02/10/21 with palpitations, while in the ER wwas feleing better, no ongoing or significant palpitations, noted PVCs only, discharged from the ER, advised to send her monitor in for eval which she did.  The tracing received yesterday and noting bursts of Torsades on her monitor advised to go to the ER.Marland Kitchen  LABS K+ 3.8 > 4.1 Mag 2.2  BUN/Creat 10/0.74  02/10/21 WBC 6.0 H/H 13/42 Plts 389 BNP 44 HS Trop 15, 15   Monitor is reviewed SR multiple symptoms and triggered events, symptoms seem mostly to correlate with PVCs, bigemeny PMVT, and  torsades noted in short bursts though grouped in a short period of time onle long episodes, reported 27 seconds, I do not start or finish of this one  The patient reports prior to her COVID infection she was very active physically olympic weight lifting, competitive volleyball. No exertional intolerances She recalls many years ago a fainting episode in the shower, unclear, suspect to have been over heated perhaps, a few years ago she  was having some lightheadedness a noncardiac work up concluded that she was likely under nourished/dehydrated for her level of physical activity and with improved oral intake resolved  Since January 2022, she is easily winded, often with a constant smoldering feeling of lightheadedness with moments of stronger dizziness sensatin, on/off palpitations most days some worse  then others. She has not fainted. Her mom notes that sometimes she will close her eyes and mentions she is very lightheaded for a moment at times.   Past Medical History:  Diagnosis Date  . Lactose intolerance     History reviewed. No pertinent surgical history.   Home Medications:  Prior to Admission medications   Medication Sig Start Date End Date Taking? Authorizing Provider  carvedilol (COREG) 6.25 MG tablet Take 1 tablet (6.25 mg total) by mouth in the morning and at bedtime. 01/27/21  Yes Bensimhon, Shaune Pascal, MD  empagliflozin (JARDIANCE) 10 MG TABS tablet Take 10 mg by mouth daily. 01/11/21 01/11/22 Yes [provider]  ENTRESTO 24-26 MG Take 1 tablet by mouth 2 (two) times daily. 01/23/21  Yes [provider]  spironolactone (ALDACTONE) 25 MG tablet Take 0.5 tablets (12.5 mg total) by mouth at bedtime. 01/27/21  Yes Bensimhon, Shaune Pascal, MD  TRI-ESTARYLLA 0.18/0.215/0.25 MG-35 MCG tablet Take 1 tablet by mouth daily. 11/12/20  Yes [provider]  valACYclovir (VALTREX) 1000 MG tablet Take 1,000 mg by mouth as needed for rash. 09/06/20  Yes [provider]    Inpatient Medications: Scheduled Meds: . carvedilol  6.25 mg Oral BID WC  . empagliflozin  10 mg Oral Daily  . sacubitril-valsartan  1 tablet Oral BID  . sodium chloride flush  3 mL Intravenous Q12H  . spironolactone  12.5 mg Oral QHS   Continuous Infusions: . sodium chloride     PRN Meds: sodium chloride, acetaminophen, ondansetron (ZOFRAN) IV, sodium chloride flush  Allergies:   No Known Allergies  Social History:   Social History   Socioeconomic History  . Marital status: Single    Spouse name: Not on file  . Number of children: Not on file  . Years of education: Not on file  . Highest education level: Not on file  Occupational History  . Not on file  Tobacco Use  . Smoking status: Never Smoker  . Smokeless tobacco: Never Used  Substance and Sexual Activity  . Alcohol use:  No  . Drug use: No  . Sexual activity: Never  Other Topics Concern  . Not on file  Social History Narrative   [employer] highschool   10th grade.   volleyball   Social Determinants of Radio broadcast assistant Strain: Not on file  Food Insecurity: Not on file  Transportation Needs: Not on file  Physical Activity: Not on file  Stress: Not on file  Social Connections: Not on file  Intimate Partner Violence: Not on file    Family History:   Family History  Problem Relation Age of Onset  . Hyperlipidemia Maternal Grandfather      ROS:  Please see the history of present illness.  All other ROS reviewed and negative.     Physical Exam/Data:   Vitals:   02/17/21 0600 02/17/21 0745 02/17/21 0827 02/17/21 0900  BP: (!) 100/57 115/73 120/73 114/68  Pulse: 65 82 80 76  Resp: 16 18 18 18   Temp: 98 F (36.7 C) 98.9 F (37.2 C)    TempSrc: Oral Temporal    SpO2: 97% 98% 98% 98%  Weight:      Height:       No intake or output data in the 24 hours ending 02/17/21 1049 Last 3 Weights 02/16/2021 02/10/2021 01/27/2021  Weight (lbs) 180 lb 12.4 oz 180 lb 184 lb 9.6 oz  Weight (kg) 82 kg 81.647 kg 83.734 kg     Body mass index is 34.16 kg/m.  General:  Well nourished, well developed, in no acute distress HEENT: normal Lymph: no adenopathy Neck: no JVD Endocrine:  No thryomegaly Vascular: No carotid bruits; FA pulses 2+ bilaterally without bruits  Cardiac:  normal S1, S2; RRR; no murmur  Lungs:  clear to auscultation bilaterally, no wheezing, rhonchi or rales  Abd: soft, nontender, no hepatomegaly  Ext: no edema Musculoskeletal:  No deformities, BUE and BLE strength normal and equal Skin: warm and dry  Neuro:  CNs 2-12 intact, no focal abnormalities noted Psych:  Normal affect   EKG:  The EKG was personally reviewed and demonstrates:   SR 105bpm, normal intervals, no pre-excitation  Telemetry:  Telemetry was personally reviewed and demonstrates:   SR 70's  Relevant CV  Studies:  As above  Laboratory Data:  High Sensitivity Troponin:   Recent Labs  Lab 02/10/21 1318 02/10/21 1830  TROPONINIHS 15 15     Chemistry Recent Labs  Lab 02/14/21 0933 02/16/21 1854 02/17/21 0500  NA 138 137 136  K 3.9 3.8 4.1  CL 103 102 104  CO2 26 27 24   GLUCOSE 107* 90 92  BUN 9 11 10   CREATININE 0.78 0.86 0.74  CALCIUM 8.9 9.1 9.0  GFRNONAA >60 >60 >60  ANIONGAP 9 8 8     Recent Labs  Lab 02/16/21 1854  PROT 7.5  ALBUMIN 4.0  AST 18  ALT 14  ALKPHOS 42  BILITOT 0.9   Hematology Recent Labs  Lab 02/10/21 1318  WBC 6.0  RBC 4.81  HGB 13.6  HCT 42.0  MCV 87.3  MCH 28.3  MCHC 32.4  RDW 13.9  PLT 389   BNP Recent Labs  Lab 02/10/21 1319  BNP 44.4    DDimer No results for input(s): DDIMER in the last 168 hours.   Radiology/Studies:  No results found.   Assessment and Plan:   1. NICM     Suspect viral  2. PVCs 3. NSPMVT and torsades      Risk Assessment/Risk Scores:  { For questions or updates, please contact [place] Please consult [email or web address] for contact info under    Signed, Baldwin Jamaica, PA-C  02/17/2021 10:49 AM        ------------------------------------------------------------------------------------------------- I have seen, examined the patient, and reviewed the above assessment and plan.    Ms. [name] is a 77 year old woman who I met in the emergency department today after being referred there for an abnormal ZIO monitor.  She has a history of nonischemic cardiomyopathy.  The etiology of her cardiomyopathy is thought to be secondary to myocarditis secondary to COVID-19.  In speaking at length with the patient and her mother today it seems that there are 2 separate issues going on.  First, she has a generalized fatigue with smoldering lightheadedness and dizziness.  She has palpitations with this.  The symptoms occur for long periods of time and are throughout the day.  Her energy level has  generally decreased since her diagnosis of nonischemic cardiomyopathy.  The second issue is her abnormal ZIO monitor which shows polymorphic ventricular tachycardia.  These episodes of torsade typically occur at night although  she does have brief nonsustained ventricular tachycardia's during the awake hours.  The majority of the arrhythmias occur when the patient is at a lower sinus rate.  I have reviewed all of her ECGs and heart monitors.  Her ECG shows sinus rhythm.  Her QTc is approximately 430 ms.  There is no preexcitation.  The T wave appears to be normal morphology.  The QRS is nonfractionated.  Her ZIO monitor which was worn for 14 days ending on February 10, 2021 shows 6 episodes of torsades with the longest lasting greater than 9 seconds.  Runs of ventricular tachycardia were detected around the time of patient triggered episodes.  She had a 1% burden of PVCs.  I have personally reviewed her echocardiogram which shows a severely decreased left ventricular function with an ejection fraction of approximately 25%.  There is global hypokinesis.  The LV appears to be dilated.  The right ventricle is normal size and has normal function.  The patient does not have a family history of premature cardiac death.  No defibrillator history.  No drownings in the family.  The patient clearly has a severe nonischemic cardiomyopathy.  I believe the majority of her symptoms are related to her nonischemic cardiomyopathy.  As a consequence of the cardiomyopathy, she is having frequent ventricular ectopy and nonsustained ventricular tachycardia's.  She is a candidate for ICD.  Given the majority of the nonsustained, polymorphic ventricular tachycardia's have occurred at night during a resting bradycardia, I will plan to implant a traditional transvenous ICD with an atrial lead to allow overdrive suppression pacing.  I will plan to refer her to our geneticist to evaluate for possible SCD syndromes although I do not  have a high suspicion for this.  I will plan to set the base pacing rate of her ICD at 80 bpm to help overdrive suppress her PVCs and polymorphic ventricular tachycardia.  I will plan to start amiodarone after the ICD is implanted to help suppress her PVCs and ventricular arrhythmias.  We discussed the risks of amiodarone long-term use.  We will plan to discontinue this medication 3 to 6 months after starting it.  We will refer her to Elias Else, PsyD to help her with the emotional toll her illness has taken.  They were very open to this idea.   Summary: 1.  Dual-chamber [employer] ICD to be implanted tomorrow 2.  Genetics referral 3.  Referral to Elias Else, PsyD 4.  Plan to start amiodarone after ICD implant.  Plan to discontinue amiodarone 3 to 6 months from now.     Vickie Epley, MD 02/17/2021 4:18 PM

## 2021-02-17 NOTE — ED Notes (Signed)
Up to bathroom...attempted to give report Nurse Britta Mccreedy reported the room has to sit empty for after cleaning due to covid + patient in there prior. Charge RN aware.

## 2021-02-17 NOTE — ED Notes (Signed)
Cardiology just left bedside. Introduced self to patient. No complaints at this time and updated family and patient about bed status

## 2021-02-17 NOTE — Progress Notes (Signed)
  Echocardiogram 2D Echocardiogram has been performed.  [name] 02/17/2021, 2:17 PM

## 2021-02-18 ENCOUNTER — Inpatient Hospital Stay (HOSPITAL_COMMUNITY)
Admission: EM | Disposition: A | Discharge status: Home / Self Care | Payer: Self-pay | Source: Home / Self Care | Attending: Cardiology

## 2021-02-18 DIAGNOSIS — I428 Other cardiomyopathies: Secondary | ICD-10-CM | POA: Diagnosis not present

## 2021-02-18 DIAGNOSIS — E739 Lactose intolerance, unspecified: Secondary | ICD-10-CM | POA: Diagnosis present

## 2021-02-18 DIAGNOSIS — Z20822 Contact with and (suspected) exposure to covid-19: Secondary | ICD-10-CM | POA: Diagnosis present

## 2021-02-18 DIAGNOSIS — I493 Ventricular premature depolarization: Secondary | ICD-10-CM | POA: Diagnosis present

## 2021-02-18 DIAGNOSIS — Z8616 Personal history of COVID-19: Secondary | ICD-10-CM | POA: Diagnosis not present

## 2021-02-18 DIAGNOSIS — I472 Ventricular tachycardia: Secondary | ICD-10-CM | POA: Diagnosis present

## 2021-02-18 DIAGNOSIS — I5022 Chronic systolic (congestive) heart failure: Secondary | ICD-10-CM | POA: Diagnosis present

## 2021-02-18 DIAGNOSIS — Z7984 Long term (current) use of oral hypoglycemic drugs: Secondary | ICD-10-CM | POA: Diagnosis not present

## 2021-02-18 DIAGNOSIS — Z79899 Other long term (current) drug therapy: Secondary | ICD-10-CM | POA: Diagnosis not present

## 2021-02-18 DIAGNOSIS — I420 Dilated cardiomyopathy: Secondary | ICD-10-CM | POA: Diagnosis present

## 2021-02-18 DIAGNOSIS — Z83438 Family history of other disorder of lipoprotein metabolism and other lipidemia: Secondary | ICD-10-CM | POA: Diagnosis not present

## 2021-02-18 HISTORY — PX: ICD IMPLANT: EP1208

## 2021-02-18 LAB — CBC
HCT: 41.3 % (ref 36.0–46.0)
Hemoglobin: 13.6 g/dL (ref 12.0–15.0)
MCH: 28.3 pg (ref 26.0–34.0)
MCHC: 32.9 g/dL (ref 30.0–36.0)
MCV: 85.9 fL (ref 80.0–100.0)
Platelets: 293 10*3/uL (ref 150–400)
RBC: 4.81 MIL/uL (ref 3.87–5.11)
RDW: 14 % (ref 11.5–15.5)
WBC: 6.9 10*3/uL (ref 4.0–10.5)
nRBC: 0 % (ref 0.0–0.2)

## 2021-02-18 LAB — BASIC METABOLIC PANEL
Anion gap: 6 (ref 5–15)
BUN: 13 mg/dL (ref 6–20)
CO2: 24 mmol/L (ref 22–32)
Calcium: 8.9 mg/dL (ref 8.9–10.3)
Chloride: 103 mmol/L (ref 98–111)
Creatinine, Ser: 0.81 mg/dL (ref 0.44–1.00)
GFR, Estimated: >60 mL/min (ref >60)
Glucose, Bld: 102 mg/dL — ABNORMAL HIGH (ref 70–99)
Potassium: 3.7 mmol/L (ref 3.5–5.1)
Sodium: 133 mmol/L — ABNORMAL LOW (ref 135–145)

## 2021-02-18 LAB — APTT: aPTT: 32 seconds (ref 24–36)

## 2021-02-18 LAB — PROTIME-INR
INR: 1 (ref 0.8–1.2)
Prothrombin Time: 13.4 seconds (ref 11.4–15.2)

## 2021-02-18 LAB — TYPE AND SCREEN
ABO/RH(D): A POS
Antibody Screen: NEGATIVE

## 2021-02-18 LAB — ABO/RH: ABO/RH(D): A POS

## 2021-02-18 SURGERY — ICD IMPLANT

## 2021-02-18 MED ORDER — SODIUM CHLORIDE 0.9 % IV SOLN
INTRAVENOUS | Status: AC
  Filled 2021-02-18: qty 2

## 2021-02-18 MED ORDER — POTASSIUM CHLORIDE CRYS ER 10 MEQ PO TBCR
40.0000 meq | EXTENDED_RELEASE_TABLET | Freq: Once | ORAL | Status: AC
  Administered 2021-02-18: 40 meq via ORAL
  Filled 2021-02-18: qty 4

## 2021-02-18 MED ORDER — DIPHENHYDRAMINE HCL 50 MG/ML IJ SOLN
INTRAMUSCULAR | Status: AC
  Filled 2021-02-18: qty 1

## 2021-02-18 MED ORDER — WHITE PETROLATUM EX OINT
TOPICAL_OINTMENT | CUTANEOUS | Status: DC | PRN
  Filled 2021-02-18: qty 28.35

## 2021-02-18 MED ORDER — LIDOCAINE HCL (PF) 1 % IJ SOLN
INTRAMUSCULAR | Status: DC | PRN
  Administered 2021-02-18: 60 mL
  Administered 2021-02-18: 20 mL

## 2021-02-18 MED ORDER — HYDROCODONE-ACETAMINOPHEN 5-325 MG PO TABS
1.0000 | ORAL_TABLET | Freq: Four times a day (QID) | ORAL | Status: DC | PRN
  Administered 2021-02-19: 1 via ORAL
  Filled 2021-02-18: qty 1

## 2021-02-18 MED ORDER — HEPARIN (PORCINE) IN NACL 1000-0.9 UT/500ML-% IV SOLN
INTRAVENOUS | Status: DC | PRN
  Administered 2021-02-18: 500 mL

## 2021-02-18 MED ORDER — FENTANYL CITRATE (PF) 100 MCG/2ML IJ SOLN
INTRAMUSCULAR | Status: AC
  Filled 2021-02-18: qty 2

## 2021-02-18 MED ORDER — MIDAZOLAM HCL 5 MG/5ML IJ SOLN
INTRAMUSCULAR | Status: DC | PRN
  Administered 2021-02-18 (×4): 1 mg via INTRAVENOUS

## 2021-02-18 MED ORDER — MIDAZOLAM HCL 5 MG/5ML IJ SOLN
INTRAMUSCULAR | Status: AC
  Filled 2021-02-18: qty 5

## 2021-02-18 MED ORDER — HEPARIN (PORCINE) IN NACL 1000-0.9 UT/500ML-% IV SOLN
INTRAVENOUS | Status: AC
  Filled 2021-02-18: qty 500

## 2021-02-18 MED ORDER — MIDAZOLAM HCL 2 MG/2ML IJ SOLN
0.5000 mg | Freq: Once | INTRAMUSCULAR | Status: AC
  Administered 2021-02-18: 0.5 mg via INTRAVENOUS
  Filled 2021-02-18: qty 2

## 2021-02-18 MED ORDER — ACETAMINOPHEN 325 MG PO TABS
325.0000 mg | ORAL_TABLET | ORAL | Status: DC | PRN
  Administered 2021-02-18 – 2021-02-19 (×3): 650 mg via ORAL
  Filled 2021-02-18 (×3): qty 2

## 2021-02-18 MED ORDER — CEFAZOLIN SODIUM-DEXTROSE 2-4 GM/100ML-% IV SOLN
INTRAVENOUS | Status: AC
  Filled 2021-02-18: qty 100

## 2021-02-18 MED ORDER — LIDOCAINE HCL 1 % IJ SOLN
INTRAMUSCULAR | Status: AC
  Filled 2021-02-18: qty 20

## 2021-02-18 MED ORDER — FENTANYL CITRATE (PF) 100 MCG/2ML IJ SOLN
INTRAMUSCULAR | Status: DC | PRN
  Administered 2021-02-18 (×4): 25 ug via INTRAVENOUS

## 2021-02-18 MED ORDER — ONDANSETRON HCL 4 MG/2ML IJ SOLN: 4.0000 mg | Freq: Four times a day (QID) | INTRAMUSCULAR | Status: DC | PRN

## 2021-02-18 MED ORDER — DIPHENHYDRAMINE HCL 50 MG/ML IJ SOLN
INTRAMUSCULAR | Status: DC | PRN
  Administered 2021-02-18: 25 mg via INTRAVENOUS

## 2021-02-18 MED ORDER — LIDOCAINE HCL 1 % IJ SOLN
INTRAMUSCULAR | Status: AC
  Filled 2021-02-18: qty 60

## 2021-02-18 SURGICAL SUPPLY — 8 items
CABLE SURGICAL S-101-97-12 (CABLE) ×2 IMPLANT
ICD VIGILANT DR D233 (Pacemaker) ×2 IMPLANT
LEAD INGEVITY 7841 52 (Lead) ×1 IMPLANT
LEAD RELIANCE 0672 ×1 IMPLANT
PAD PRO RADIOLUCENT 2001M-C (PAD) ×2 IMPLANT
SHEATH 7FR PRELUDE SNAP 13 (SHEATH) ×1 IMPLANT
SHEATH 8FR PRELUDE SNAP 13 (SHEATH) ×1 IMPLANT
TRAY PACEMAKER INSERTION (PACKS) ×2 IMPLANT

## 2021-02-18 NOTE — Progress Notes (Signed)
Verified with PA order for versed , to be given on call.

## 2021-02-18 NOTE — Progress Notes (Signed)
Complained of dryness of the lips. Vaseline  Cream given.

## 2021-02-18 NOTE — Progress Notes (Signed)
Iv team consulted for another iv line on left arm for the procedure,

## 2021-02-18 NOTE — Progress Notes (Signed)
Back from the cath lab by bed awake and alert. Left arm sling  In placed. Instructed not to remove sling to prevent moving the affected arm. Dressing to left upper chest dry and intact. Continue to monitor.

## 2021-02-18 NOTE — Plan of Care (Signed)

## 2021-02-18 NOTE — Progress Notes (Signed)
Progress Note  Patient Name: [name] Date of Encounter: 02/18/2021  [facility] Cardiologist: Little Ishikawa, MD   Subjective   Slept well, no complaints  Inpatient Medications    Scheduled Meds: . carvedilol  6.25 mg Oral BID WC  . empagliflozin  10 mg Oral Daily  . gentamicin irrigation  80 mg Irrigation On Call  . Norgestimate-Ethinyl Estradiol Triphasic  1 tablet Oral Daily  . sacubitril-valsartan  1 tablet Oral BID  . sodium chloride flush  3 mL Intravenous Q12H  . sodium chloride flush  3 mL Intravenous Q12H  . spironolactone  12.5 mg Oral QHS   Continuous Infusions: . sodium chloride    . sodium chloride 50 mL/hr at 02/18/21 0601  . sodium chloride    .  ceFAZolin (ANCEF) IV     PRN Meds: sodium chloride, acetaminophen, ondansetron (ZOFRAN) IV, sodium chloride flush, sodium chloride flush   Vital Signs    Vitals:   02/17/21 2357 02/18/21 0312 02/18/21 0411 02/18/21 0555  BP: 100/61     Pulse:  84  83  Resp:  18  19  Temp: 98.5 F (36.9 C)  98.3 F (36.8 C)   TempSrc: Oral  Oral   SpO2:  98%  96%  Weight:    81.1 kg  Height:        Intake/Output Summary (Last 24 hours) at 02/18/2021 0917 Last data filed at 02/17/2021 2200 Gross per 24 hour  Intake 240 ml  Output --  Net 240 ml   Last 3 Weights 02/18/2021 02/17/2021 02/16/2021  Weight (lbs) 178 lb 12.7 oz 180 lb 180 lb 12.4 oz  Weight (kg) 81.1 kg 81.647 kg 82 kg      Telemetry    SR - Personally Reviewed  ECG    No new EKGs - Personally Reviewed  Physical Exam   GEN: No acute distress.   Neck: No JVD Cardiac: RRR, no murmurs, rubs, or gallops.  Respiratory: CTA b/l. GI: Soft, nontender, non-distended  MS: No edema; No deformity. Neuro:  Nonfocal  Psych: Normal affect   Labs    High Sensitivity Troponin:   Recent Labs  Lab 02/10/21 1318 02/10/21 1830  TROPONINIHS 15 15      Chemistry Recent Labs  Lab 02/16/21 1854 02/17/21 0500 02/18/21 0451  NA  137 136 133*  K 3.8 4.1 3.7  CL 102 104 103  CO2 27 24 24   GLUCOSE 90 92 102*  BUN 11 10 13   CREATININE 0.86 0.74 0.81  CALCIUM 9.1 9.0 8.9  PROT 7.5  --   --   ALBUMIN 4.0  --   --   AST 18  --   --   ALT 14  --   --   ALKPHOS 42  --   --   BILITOT 0.9  --   --   GFRNONAA >60 >60 >60  ANIONGAP 8 8 6      Hematology Recent Labs  Lab 02/18/21 0451  WBC 6.9  RBC 4.81  HGB 13.6  HCT 41.3  MCV 85.9  MCH 28.3  MCHC 32.9  RDW 14.0  PLT 293    BNPNo results for input(s): BNP, PROBNP in the last 168 hours.   DDimer No results for input(s): DDIMER in the last 168 hours.   Radiology    Cardiac Studies    02/17/2021: TTE IMPRESSIONS  1. Left ventricular ejection fraction, by estimation, is 25%. The left  ventricle has severely decreased function.  The left ventricle demonstrates  global hypokinesis. The left ventricular internal cavity size was  moderately dilated. Left ventricular  diastolic parameters are consistent with Grade I diastolic dysfunction  (impaired relaxation).  2. Right ventricular systolic function is normal. The right ventricular  size is normal. Tricuspid regurgitation signal is inadequate for assessing  PA pressure.  3. The mitral valve is normal in structure. Mild to moderate mitral valve  regurgitation. No evidence of mitral stenosis.  4. The aortic valve is tricuspid. Aortic valve regurgitation is not  visualized. No aortic stenosis is present.  5. Aortic dilatation noted. There is mild dilatation of the aortic root,  measuring 38 mm.  6. The inferior vena cava is normal in size with greater than 50%  respiratory variability, suggesting right atrial pressure of 3 mmHg.    11/19/2020 and echocardiogram and Holter monitor were ordered.  Echocardiogram on 12/03/2020 showed severe LV dilatation, severe systolic dysfunction (EF 15 to 20%), normal RV function, no significant valvular disease.  Holter monitor showed rare ectopy and 6  beatsrunof NSVT. Given her systolic dysfunction,  Lexiscan Myoview was done on 01/04/2021, which showed anterior perfusion defect concerning for ischemia, EF 26%.  She presented back to the ED with chest pain on 01/09/2021.  Cardiac catheterization on 01/10/2021 showed normal coronary arteries, normal LVEDP.  Patient Profile     25 y.o. female with NICM admitted to [facility] with episodes of torsades noted on an out p patient monitor  Assessment & Plan    1. NICM     Suspect viral 2. PVCs 3. nonsustained PMVT and torsades  She has had a number of monitors with increasing burden of V ectopy and now self limiting episodes of torsades She also noted to have some degree of bradycardia, suspect to possibly play some role in brady-mediated VT LVEF  Remains markedly abnormal/reduced  Planned for Dual chamber ICD today >> short course of amio I have sent to [name] RN staff message for genetics referral as well as [name] for psychiatry as well  [name] has discussed at length with the patient and her mom recommendations and rational, the procedure, potential risks and benefits He has seen the patient today as well and she remains agreeable to proceed.  She requests some anxiolytic prior to the procedure, will plan for 0.5mg  versed on call   CM and HF meds deferred to HF team/attending.   For questions or updates, please contact [facility] Please consult [email or web address] for contact info under        Signed, [name], PA-C  02/18/2021, 9:17 AM

## 2021-02-18 NOTE — Progress Notes (Signed)
Cath. Lab called and claimed to get ready for the pt to go for ICD insertion.Versed .5mg  iv given as ordered, claimed to feel a little dizzy after med given stayed with the pt and started to close eyes. Mother and sister at bedside. Continue to monitor.

## 2021-02-18 NOTE — Progress Notes (Signed)
Advanced Heart Failure Rounding Note   Subjective:     No further VT overnight. Denies SOB, orthopnea or PND.    Objective:   Weight Range:  Vital Signs:   Temp:  [98 F (36.7 C)-99.1 F (37.3 C)] 98.1 F (36.7 C) (04/15 0900) Pulse Rate:  [59-97] 59 (04/15 1100) Resp:  [15-26] 15 (04/15 1100) BP: (93-127)/(54-96) 93/54 (04/15 1100) SpO2:  [91 %-100 %] 91 % (04/15 1100) Weight:  [81.1 kg] 81.1 kg (04/15 0555)    Weight change: Filed Weights   02/16/21 1915 02/17/21 1051 02/18/21 0555  Weight: 82 kg 81.6 kg 81.1 kg    Intake/Output:   Intake/Output Summary (Last 24 hours) at 02/18/2021 1143 Last data filed at 02/18/2021 1100 Gross per 24 hour  Intake 390 ml  Output --  Net 390 ml     Physical Exam: General:  Well appearing. No resp difficulty HEENT: normal Neck: supple. JVP flat . Carotids 2+ bilat; no bruits. No lymphadenopathy or thryomegaly appreciated. Cor: PMI nondisplaced. Regular rate & rhythm. No rubs, gallops or murmurs. Lungs: clear Abdomen: soft, nontender, nondistended. No hepatosplenomegaly. No bruits or masses. Good bowel sounds. Extremities: no cyanosis, clubbing, rash, edema Neuro: alert & orientedx3, cranial nerves grossly intact. moves all 4 extremities w/o difficulty. Affect pleasant  Telemetry: SR no VT. Personally reviewed   Labs: Basic Metabolic Panel: Recent Labs  Lab 02/14/21 0933 02/16/21 1854 02/17/21 0500 02/18/21 0451  NA 138 137 136 133*  K 3.9 3.8 4.1 3.7  CL 103 102 104 103  CO2 26 27 24 24   GLUCOSE 107* 90 92 102*  BUN 9 11 10 13   CREATININE 0.78 0.86 0.74 0.81  CALCIUM 8.9 9.1 9.0 8.9  MG  --  2.2  --   --     Liver Function Tests: Recent Labs  Lab 02/16/21 1854  AST 18  ALT 14  ALKPHOS 42  BILITOT 0.9  PROT 7.5  ALBUMIN 4.0   No results for input(s): LIPASE, AMYLASE in the last 168 hours. No results for input(s): AMMONIA in the last 168 hours.  CBC: Recent Labs  Lab 02/18/21 0451  WBC 6.9   HGB 13.6  HCT 41.3  MCV 85.9  PLT 293    Cardiac Enzymes: No results for input(s): CKTOTAL, CKMB, CKMBINDEX, TROPONINI in the last 168 hours.  BNP: BNP (last 3 results) Recent Labs    02/10/21 1319  BNP 44.4    ProBNP (last 3 results) No results for input(s): PROBNP in the last 8760 hours.    Other results:  Imaging: ECHOCARDIOGRAM COMPLETE  Result Date: 02/17/2021    ECHOCARDIOGRAM REPORT   Patient Name:   [name] Date of Exam: 02/17/2021 Medical Rec #:  Jerene Dilling           Height:       66.0 in Accession #:    02/19/2021          Weight:       180.0 lb Date of Birth:  [birth year]           BSA:          1.912 m Patient Age:    24 years            BP:           111/79 mmHg Patient Gender: F                   HR:  81 bpm. Exam Location:  Inpatient Procedure: 2D Echo, Cardiac Doppler and Color Doppler                        STAT ECHO Reported to: Dr. Marca Ancona on 02/17/2021 2:15:00 PM. Indications:    Cardiac Arrest I46.9  History:        Patient has no prior history of Echocardiogram examinations.  Sonographer:    Tiffany Dance Referring Phys: [id number] Lanier Prude IMPRESSIONS  1. Left ventricular ejection fraction, by estimation, is 25%. The left ventricle has severely decreased function. The left ventricle demonstrates global hypokinesis. The left ventricular internal cavity size was moderately dilated. Left ventricular diastolic parameters are consistent with Grade I diastolic dysfunction (impaired relaxation).  2. Right ventricular systolic function is normal. The right ventricular size is normal. Tricuspid regurgitation signal is inadequate for assessing PA pressure.  3. The mitral valve is normal in structure. Mild to moderate mitral valve regurgitation. No evidence of mitral stenosis.  4. The aortic valve is tricuspid. Aortic valve regurgitation is not visualized. No aortic stenosis is present.  5. Aortic dilatation noted. There is mild dilatation of the  aortic root, measuring 38 mm.  6. The inferior vena cava is normal in size with greater than 50% respiratory variability, suggesting right atrial pressure of 3 mmHg. FINDINGS  Left Ventricle: Left ventricular ejection fraction, by estimation, is 25%. The left ventricle has severely decreased function. The left ventricle demonstrates global hypokinesis. The left ventricular internal cavity size was moderately dilated. There is  no left ventricular hypertrophy. Left ventricular diastolic parameters are consistent with Grade I diastolic dysfunction (impaired relaxation). Right Ventricle: The right ventricular size is normal. No increase in right ventricular wall thickness. Right ventricular systolic function is normal. Tricuspid regurgitation signal is inadequate for assessing PA pressure. Left Atrium: Left atrial size was normal in size. Right Atrium: Right atrial size was normal in size. Pericardium: There is no evidence of pericardial effusion. Mitral Valve: The mitral valve is normal in structure. Mild to moderate mitral valve regurgitation. No evidence of mitral valve stenosis. Tricuspid Valve: The tricuspid valve is normal in structure. Tricuspid valve regurgitation is not demonstrated. Aortic Valve: The aortic valve is tricuspid. Aortic valve regurgitation is not visualized. No aortic stenosis is present. Pulmonic Valve: The pulmonic valve was normal in structure. Pulmonic valve regurgitation is not visualized. Aorta: Aortic dilatation noted. There is mild dilatation of the aortic root, measuring 38 mm. Venous: The inferior vena cava is normal in size with greater than 50% respiratory variability, suggesting right atrial pressure of 3 mmHg. IAS/Shunts: No atrial level shunt detected by color flow Doppler.  LEFT VENTRICLE PLAX 2D LVIDd:         6.40 cm  Diastology LVIDs:         6.10 cm  LV e' medial:    6.08 cm/s LV PW:         1.00 cm  LV E/e' medial:  10.2 LV IVS:        0.80 cm  LV e' lateral:   4.87 cm/s  LVOT diam:     2.00 cm  LV E/e' lateral: 12.8 LV SV:         34 LV SV Index:   18 LVOT Area:     3.14 cm  RIGHT VENTRICLE             IVC RV Basal diam:  1.70 cm     IVC diam:  1.30 cm RV S prime:     13.70 cm/s TAPSE (M-mode): 2.1 cm LEFT ATRIUM             Index       RIGHT ATRIUM          Index LA diam:        3.40 cm 1.78 cm/m  RA Area:     8.07 cm LA Vol (A2C):   36.9 ml 19.30 ml/m RA Volume:   13.10 ml 6.85 ml/m LA Vol (A4C):   20.4 ml 10.67 ml/m LA Biplane Vol: 27.2 ml 14.22 ml/m  AORTIC VALVE LVOT Vmax:   56.90 cm/s LVOT Vmean:  40.300 cm/s LVOT VTI:    0.107 m  AORTA Ao Root diam: 3.80 cm Ao Asc diam:  2.40 cm MITRAL VALVE MV Area (PHT): 2.62 cm      SHUNTS MV Decel Time: 289 msec      Systemic VTI:  0.11 m MR Peak grad:    85.7 mmHg   Systemic Diam: 2.00 cm MR Mean grad:    56.0 mmHg MR Vmax:         463.00 cm/s MR Vmean:        352.0 cm/s MR PISA:         0.25 cm MR PISA Eff ROA: 2 mm MR PISA Radius:  0.20 cm MV E velocity: 62.10 cm/s MV A velocity: 75.20 cm/s MV E/A ratio:  0.83 Marca Ancona MD Electronically signed by Marca Ancona MD Signature Date/Time: 02/17/2021/2:44:44 PM    Final       Medications:     Scheduled Medications: . carvedilol  6.25 mg Oral BID WC  . empagliflozin  10 mg Oral Daily  . gentamicin irrigation  80 mg Irrigation On Call  . midazolam  0.5 mg Intravenous Once  . Norgestimate-Ethinyl Estradiol Triphasic  1 tablet Oral Daily  . sacubitril-valsartan  1 tablet Oral BID  . sodium chloride flush  3 mL Intravenous Q12H  . sodium chloride flush  3 mL Intravenous Q12H  . spironolactone  12.5 mg Oral QHS     Infusions: . sodium chloride    . sodium chloride 50 mL/hr at 02/18/21 0601  . sodium chloride    .  ceFAZolin (ANCEF) IV       PRN Medications:  sodium chloride, acetaminophen, ondansetron (ZOFRAN) IV, sodium chloride flush, sodium chloride flush   Assessment/Plan:    1.Torsades/VT - no further episodes overnight - for BivICD today -  K 3.7  Mag ok. Aim to keep K >4 and Mag >2. Will supp - No driving for 6 months.   2. Chronic HFrEF due to NICM. Suspect viral (? COVID) myocarditis -Echo 1/22 EF 15-20% - Holter monitor1/22showed rare ectopy and 6 beatsrunof NSVT. Eugenie Birks Myoview 3/22EF 26%withanterior perfusion defect concerning for ischemia -[place], normal LVEDP.  -cMRI 01/26/21 LVEF 31% markedly dilated No significant LGE. RV normal. No non-compaction or other pathology - Volume status stable. Volume status looks good.  - Continue current dose of coreg, entresto, spironolactone, and jardiance. No room to titrate at this point    Length of Stay: 0   Arvilla Meres MD 02/18/2021, 11:43 AM  Advanced Heart Failure Team Pager [phone] (M-F; 7a - 4p)  Please contact [facility] for night-coverage after hours (4p -7a ) and weekends on [email or web address]

## 2021-02-18 NOTE — Progress Notes (Signed)
Transported to the cath lab by bed for ICD insertion.

## 2021-02-19 ENCOUNTER — Encounter (HOSPITAL_COMMUNITY): Payer: Self-pay | Admitting: Cardiology

## 2021-02-19 ENCOUNTER — Inpatient Hospital Stay (HOSPITAL_COMMUNITY): Payer: BC Managed Care – PPO

## 2021-02-19 ENCOUNTER — Telehealth: Payer: Self-pay | Admitting: Cardiology

## 2021-02-19 DIAGNOSIS — I493 Ventricular premature depolarization: Secondary | ICD-10-CM

## 2021-02-19 DIAGNOSIS — I5022 Chronic systolic (congestive) heart failure: Secondary | ICD-10-CM | POA: Diagnosis not present

## 2021-02-19 DIAGNOSIS — I472 Ventricular tachycardia: Secondary | ICD-10-CM | POA: Diagnosis not present

## 2021-02-19 DIAGNOSIS — I428 Other cardiomyopathies: Secondary | ICD-10-CM

## 2021-02-19 MED ORDER — SACUBITRIL-VALSARTAN 24-26 MG PO TABS
1.0000 | ORAL_TABLET | Freq: Two times a day (BID) | ORAL | Status: DC
  Administered 2021-02-19: 1 via ORAL
  Filled 2021-02-19: qty 1

## 2021-02-19 MED ORDER — POTASSIUM CHLORIDE CRYS ER 20 MEQ PO TBCR
20.0000 meq | EXTENDED_RELEASE_TABLET | Freq: Every day | ORAL | Status: DC
  Administered 2021-02-19: 20 meq via ORAL
  Filled 2021-02-19: qty 1

## 2021-02-19 MED ORDER — CARVEDILOL 6.25 MG PO TABS
6.2500 mg | ORAL_TABLET | Freq: Two times a day (BID) | ORAL | Status: DC
  Administered 2021-02-19: 6.25 mg via ORAL
  Filled 2021-02-19: qty 1

## 2021-02-19 MED ORDER — SPIRONOLACTONE 12.5 MG HALF TABLET: 12.5000 mg | ORAL_TABLET | Freq: Every day | ORAL | Status: DC

## 2021-02-19 MED ORDER — POTASSIUM CHLORIDE CRYS ER 20 MEQ PO TBCR: 20.0000 meq | EXTENDED_RELEASE_TABLET | Freq: Every day | ORAL | 3 refills | Status: DC

## 2021-02-19 NOTE — Discharge Instructions (Signed)
    Supplemental Discharge Instructions for  Defibrillator Patients   Activity No heavy lifting or vigorous activity with your left/right arm for 6 to 8 weeks.  Do not raise your left arm above your head for one week.  Gradually raise your affected arm as drawn below.              02/26/21                    02/27/21                    02/28/21                   03/01/21 __  NO DRIVING for 6 months  WOUND CARE - Keep the wound area clean and dry.  Do not get this area wet , no shower until cleared to at your wound check visit - The tape/steri-strips on your wound will fall off; do not pull them off.  No bandage is needed on the site.  DO  NOT apply any creams, oils, or ointments to the wound area. - If you notice any drainage or discharge from the wound, any swelling or bruising at the site, or you develop a fever > 101? F after you are discharged home, call the office at once.  Special Instructions - You are still able to use cellular telephones; use the ear opposite the side where you have your pacemaker/defibrillator.  Avoid carrying your cellular phone near your device. - When traveling through airports, show security personnel your identification card to avoid being screened in the metal detectors.  Ask the security personnel to use the hand wand. - Avoid arc welding equipment, MRI testing (magnetic resonance imaging), TENS units (transcutaneous nerve stimulators).  Call the office for questions about other devices. - Avoid electrical appliances that are in poor condition or are not properly grounded. - Microwave ovens are safe to be near or to operate.  Additional information for defibrillator patients should your device go off: - If your device goes off ONCE and you feel fine afterward, notify the device clinic nurses. - If your device goes off ONCE and you do not feel well afterward, call 911. - If your device goes off TWICE, call 911. - If your device goes off THREE times in one  day, call 911.  DO NOT DRIVE YOURSELF OR A FAMILY MEMBER WITH A DEFIBRILLATOR TO THE HOSPITAL--CALL 911.

## 2021-02-19 NOTE — Progress Notes (Signed)
Advanced Heart Failure Rounding Note   Subjective:     No further VT overnight. S/p ICD yesterday. Sore at site.   Denies SOB, orthopnea or PND.     Objective:   Weight Range:  Vital Signs:   Temp:  [97.9 F (36.6 C)-98.9 F (37.2 C)] 98.5 F (36.9 C) (04/16 1100) Pulse Rate:  [80-113] 80 (04/16 1100) Resp:  [9-105] 17 (04/16 1100) BP: (99-138)/(58-86) 127/64 (04/16 1100) SpO2:  [96 %-100 %] 99 % (04/16 1100)    Weight change: Filed Weights   02/16/21 1915 02/17/21 1051 02/18/21 0555  Weight: 82 kg 81.6 kg 81.1 kg    Intake/Output:   Intake/Output Summary (Last 24 hours) at 02/19/2021 1217 Last data filed at 02/19/2021 1100 Gross per 24 hour  Intake 680 ml  Output 300 ml  Net 380 ml     Physical Exam: General:  Well appearing. No resp difficulty HEENT: normal Neck: supple. no JVD. Carotids 2+ bilat; no bruits. No lymphadenopathy or thryomegaly appreciated. Cor: PMI nondisplaced. Regular rate & rhythm. No rubs, gallops or murmurs. Small hematoma at ICD iste Lungs: clear Abdomen: soft, nontender, nondistended. No hepatosplenomegaly. No bruits or masses. Good bowel sounds. Extremities: no cyanosis, clubbing, rash, edema Neuro: alert & orientedx3, cranial nerves grossly intact. moves all 4 extremities w/o difficulty. Affect pleasant  Telemetry:  SR no vt Personally reviewed    Labs: Basic Metabolic Panel: Recent Labs  Lab 02/14/21 0933 02/16/21 1854 02/17/21 0500 02/18/21 0451  NA 138 137 136 133*  K 3.9 3.8 4.1 3.7  CL 103 102 104 103  CO2 26 27 24 24   GLUCOSE 107* 90 92 102*  BUN 9 11 10 13   CREATININE 0.78 0.86 0.74 0.81  CALCIUM 8.9 9.1 9.0 8.9  MG  --  2.2  --   --     Liver Function Tests: Recent Labs  Lab 02/16/21 1854  AST 18  ALT 14  ALKPHOS 42  BILITOT 0.9  PROT 7.5  ALBUMIN 4.0   No results for input(s): LIPASE, AMYLASE in the last 168 hours. No results for input(s): AMMONIA in the last 168 hours.  CBC: Recent Labs   Lab 02/18/21 0451  WBC 6.9  HGB 13.6  HCT 41.3  MCV 85.9  PLT 293    Cardiac Enzymes: No results for input(s): CKTOTAL, CKMB, CKMBINDEX, TROPONINI in the last 168 hours.  BNP: BNP (last 3 results) Recent Labs    02/10/21 1319  BNP 44.4    ProBNP (last 3 results) No results for input(s): PROBNP in the last 8760 hours.    Other results:  Imaging: DG Chest 2 View  Result Date: 02/19/2021 CLINICAL DATA:  Post ICD EXAM: CHEST - 2 VIEW COMPARISON:  Chest x-rays dated 02/10/2021 and 09/05/2006. FINDINGS: New LEFT chest wall ICD apparatus in place. No pleural effusion or pneumothorax is seen. Lungs are clear. Osseous structures about the chest are unremarkable. IMPRESSION: 1. New ICD hardware in place. No evidence of procedural complicating feature. 2. Lungs are clear.  No evidence of pneumonia or pulmonary edema. Electronically Signed   By: 04/12/2021 M.D.   On: 02/19/2021 07:11   ECHOCARDIOGRAM COMPLETE  Result Date: 02/17/2021    ECHOCARDIOGRAM REPORT   Patient Name:   [name] Date of Exam: 02/17/2021 Medical Rec #:  Jerene Dilling           Height:       66.0 in Accession #:    02/19/2021  Weight:       180.0 lb Date of Birth:  11/17/1995           BSA:          1.912 m Patient Age:    25 years            BP:           111/79 mmHg Patient Gender: F                   HR:           81 bpm. Exam Location:  Inpatient Procedure: 2D Echo, Cardiac Doppler and Color Doppler                        STAT ECHO Reported to: Dr. Marca Ancona on 02/17/2021 2:15:00 PM. Indications:    Cardiac Arrest I46.9  History:        Patient has no prior history of Echocardiogram examinations.  Sonographer:    Tiffany Dance Referring Phys: [id number] Lanier Prude IMPRESSIONS  1. Left ventricular ejection fraction, by estimation, is 25%. The left ventricle has severely decreased function. The left ventricle demonstrates global hypokinesis. The left ventricular internal cavity size was  moderately dilated. Left ventricular diastolic parameters are consistent with Grade I diastolic dysfunction (impaired relaxation).  2. Right ventricular systolic function is normal. The right ventricular size is normal. Tricuspid regurgitation signal is inadequate for assessing PA pressure.  3. The mitral valve is normal in structure. Mild to moderate mitral valve regurgitation. No evidence of mitral stenosis.  4. The aortic valve is tricuspid. Aortic valve regurgitation is not visualized. No aortic stenosis is present.  5. Aortic dilatation noted. There is mild dilatation of the aortic root, measuring 38 mm.  6. The inferior vena cava is normal in size with greater than 50% respiratory variability, suggesting right atrial pressure of 3 mmHg. FINDINGS  Left Ventricle: Left ventricular ejection fraction, by estimation, is 25%. The left ventricle has severely decreased function. The left ventricle demonstrates global hypokinesis. The left ventricular internal cavity size was moderately dilated. There is  no left ventricular hypertrophy. Left ventricular diastolic parameters are consistent with Grade I diastolic dysfunction (impaired relaxation). Right Ventricle: The right ventricular size is normal. No increase in right ventricular wall thickness. Right ventricular systolic function is normal. Tricuspid regurgitation signal is inadequate for assessing PA pressure. Left Atrium: Left atrial size was normal in size. Right Atrium: Right atrial size was normal in size. Pericardium: There is no evidence of pericardial effusion. Mitral Valve: The mitral valve is normal in structure. Mild to moderate mitral valve regurgitation. No evidence of mitral valve stenosis. Tricuspid Valve: The tricuspid valve is normal in structure. Tricuspid valve regurgitation is not demonstrated. Aortic Valve: The aortic valve is tricuspid. Aortic valve regurgitation is not visualized. No aortic stenosis is present. Pulmonic Valve: The pulmonic  valve was normal in structure. Pulmonic valve regurgitation is not visualized. Aorta: Aortic dilatation noted. There is mild dilatation of the aortic root, measuring 38 mm. Venous: The inferior vena cava is normal in size with greater than 50% respiratory variability, suggesting right atrial pressure of 3 mmHg. IAS/Shunts: No atrial level shunt detected by color flow Doppler.  LEFT VENTRICLE PLAX 2D LVIDd:         6.40 cm  Diastology LVIDs:         6.10 cm  LV e' medial:    6.08 cm/s LV PW:  1.00 cm  LV E/e' medial:  10.2 LV IVS:        0.80 cm  LV e' lateral:   4.87 cm/s LVOT diam:     2.00 cm  LV E/e' lateral: 12.8 LV SV:         34 LV SV Index:   18 LVOT Area:     3.14 cm  RIGHT VENTRICLE             IVC RV Basal diam:  1.70 cm     IVC diam: 1.30 cm RV S prime:     13.70 cm/s TAPSE (M-mode): 2.1 cm LEFT ATRIUM             Index       RIGHT ATRIUM          Index LA diam:        3.40 cm 1.78 cm/m  RA Area:     8.07 cm LA Vol (A2C):   36.9 ml 19.30 ml/m RA Volume:   13.10 ml 6.85 ml/m LA Vol (A4C):   20.4 ml 10.67 ml/m LA Biplane Vol: 27.2 ml 14.22 ml/m  AORTIC VALVE LVOT Vmax:   56.90 cm/s LVOT Vmean:  40.300 cm/s LVOT VTI:    0.107 m  AORTA Ao Root diam: 3.80 cm Ao Asc diam:  2.40 cm MITRAL VALVE MV Area (PHT): 2.62 cm      SHUNTS MV Decel Time: 289 msec      Systemic VTI:  0.11 m MR Peak grad:    85.7 mmHg   Systemic Diam: 2.00 cm MR Mean grad:    56.0 mmHg MR Vmax:         463.00 cm/s MR Vmean:        352.0 cm/s MR PISA:         0.25 cm MR PISA Eff ROA: 2 mm MR PISA Radius:  0.20 cm MV E velocity: 62.10 cm/s MV A velocity: 75.20 cm/s MV E/A ratio:  0.83 Marca Ancona MD Electronically signed by Marca Ancona MD Signature Date/Time: 02/17/2021/2:44:44 PM    Final      Medications:     Scheduled Medications: . carvedilol  6.25 mg Oral BID WC  . potassium chloride  20 mEq Oral Daily  . sacubitril-valsartan  1 tablet Oral BID  . spironolactone  12.5 mg Oral QHS    Infusions:   PRN  Medications: acetaminophen, HYDROcodone-acetaminophen, ondansetron (ZOFRAN) IV   Assessment/Plan:    1.Torsades/VT - no further episodes while in hospital - s/p BivICD 4/15 - K 3.7  Mag ok. Aim to keep K >4 and Mag >2. Will supp - No driving for 6 months.  - [name] recommended amio but not started. D/w Dr. Ladona Ridgel. Will hold off for now. Wound check arranged  2. Chronic HFrEF due to NICM. Suspect viral (? COVID) myocarditis -Echo 1/22 EF 15-20% - Holter monitor1/22showed rare ectopy and 6 beatsrunof NSVT. Eugenie Birks Myoview 3/22EF 26%withanterior perfusion defect concerning for ischemia -[facility]iac [facility], normal LVEDP.  -cMRI 01/26/21 LVEF 31% markedly dilated No significant LGE. RV normal. No non-compaction or other pathology - Volume status looks good - Continue current dose of coreg, entresto, spironolactone, and jardiance. No room to titrate at this point - F/u HF Clinic     Length of Stay: 1   Arvilla Meres MD 02/19/2021, 12:17 PM  Advanced Heart Failure Team Pager [phone] (M-F; 7a - 4p)  Please contact [facility] for night-coverage after hours (  4p -7a ) and weekends on [email or web address]

## 2021-02-19 NOTE — Progress Notes (Signed)
Progress Note  Patient Name: [name] Date of Encounter: 02/19/2021  Primary Cardiologist: Little Ishikawa, MD   Subjective   c/o incisional pain  Inpatient Medications    Scheduled Meds:  Continuous Infusions:  PRN Meds: acetaminophen, HYDROcodone-acetaminophen, ondansetron (ZOFRAN) IV   Vital Signs    Vitals:   02/18/21 1900 02/18/21 2300 02/19/21 0300 02/19/21 0800  BP:  113/66 138/77 136/78  Pulse: 86 86 81 80  Resp: 18 18 13 16   Temp: 98.5 F (36.9 C) 98.9 F (37.2 C) 97.9 F (36.6 C) 98.5 F (36.9 C)  TempSrc: Oral Oral Oral Oral  SpO2: 97% 96% 97% 97%  Weight:      Height:        Intake/Output Summary (Last 24 hours) at 02/19/2021 0834 Last data filed at 02/18/2021 1800 Gross per 24 hour  Intake 400 ml  Output --  Net 400 ml   Filed Weights   02/16/21 1915 02/17/21 1051 02/18/21 0555  Weight: 82 kg 81.6 kg 81.1 kg    Telemetry    nsr - Personally Reviewed  ECG    nsr - Personally Reviewed  Physical Exam   GEN: No acute distress.   Neck: No JVD Cardiac: RRR, no murmurs, rubs, or gallops.  Respiratory: Clear to auscultation bilaterally.minimal hematoma GI: Soft, nontender, non-distended  MS: No edema; No deformity. Neuro:  Nonfocal  Psych: Normal affect   Labs    Chemistry Recent Labs  Lab 02/16/21 1854 02/17/21 0500 02/18/21 0451  NA 137 136 133*  K 3.8 4.1 3.7  CL 102 104 103  CO2 27 24 24   GLUCOSE 90 92 102*  BUN 11 10 13   CREATININE 0.86 0.74 0.81  CALCIUM 9.1 9.0 8.9  PROT 7.5  --   --   ALBUMIN 4.0  --   --   AST 18  --   --   ALT 14  --   --   ALKPHOS 42  --   --   BILITOT 0.9  --   --   GFRNONAA >60 >60 >60  ANIONGAP 8 8 6      Hematology Recent Labs  Lab 02/18/21 0451  WBC 6.9  RBC 4.81  HGB 13.6  HCT 41.3  MCV 85.9  MCH 28.3  MCHC 32.9  RDW 14.0  PLT 293    Cardiac EnzymesNo results for input(s): TROPONINI in the last 168 hours. No results for input(s): TROPIPOC in the last  168 hours.   BNPNo results for input(s): BNP, PROBNP in the last 168 hours.   DDimer No results for input(s): DDIMER in the last 168 hours.   Radiology    DG Chest 2 View  Result Date: 02/19/2021 CLINICAL DATA:  Post ICD EXAM: CHEST - 2 VIEW COMPARISON:  Chest x-rays dated 02/10/2021 and 09/05/2006. FINDINGS: New LEFT chest wall ICD apparatus in place. No pleural effusion or pneumothorax is seen. Lungs are clear. Osseous structures about the chest are unremarkable. IMPRESSION: 1. New ICD hardware in place. No evidence of procedural complicating feature. 2. Lungs are clear.  No evidence of pneumonia or pulmonary edema. Electronically Signed   By: 02/20/21 M.D.   On: 02/19/2021 07:11   ECHOCARDIOGRAM COMPLETE  Result Date: 02/17/2021    ECHOCARDIOGRAM REPORT   Patient Name:   [name] Date of Exam: 02/17/2021 Medical Rec #:  02/21/2021           Height:       66.0 in Accession #:  [id number]          Weight:       180.0 lb Date of Birth:  [birth year]           BSA:          1.912 m Patient Age:    25 years            BP:           111/79 mmHg Patient Gender: F                   HR:           81 bpm. Exam Location:  Inpatient Procedure: 2D Echo, Cardiac Doppler and Color Doppler                        STAT ECHO Reported to: Dr. Marca Ancona on 02/17/2021 2:15:00 PM. Indications:    Cardiac Arrest I46.9  History:        Patient has no prior history of Echocardiogram examinations.  Sonographer:    Tiffany Dance Referring Phys: [id number] Lanier Prude IMPRESSIONS  1. Left ventricular ejection fraction, by estimation, is 25%. The left ventricle has severely decreased function. The left ventricle demonstrates global hypokinesis. The left ventricular internal cavity size was moderately dilated. Left ventricular diastolic parameters are consistent with Grade I diastolic dysfunction (impaired relaxation).  2. Right ventricular systolic function is normal. The right ventricular size is normal.  Tricuspid regurgitation signal is inadequate for assessing PA pressure.  3. The mitral valve is normal in structure. Mild to moderate mitral valve regurgitation. No evidence of mitral stenosis.  4. The aortic valve is tricuspid. Aortic valve regurgitation is not visualized. No aortic stenosis is present.  5. Aortic dilatation noted. There is mild dilatation of the aortic root, measuring 38 mm.  6. The inferior vena cava is normal in size with greater than 50% respiratory variability, suggesting right atrial pressure of 3 mmHg. FINDINGS  Left Ventricle: Left ventricular ejection fraction, by estimation, is 25%. The left ventricle has severely decreased function. The left ventricle demonstrates global hypokinesis. The left ventricular internal cavity size was moderately dilated. There is  no left ventricular hypertrophy. Left ventricular diastolic parameters are consistent with Grade I diastolic dysfunction (impaired relaxation). Right Ventricle: The right ventricular size is normal. No increase in right ventricular wall thickness. Right ventricular systolic function is normal. Tricuspid regurgitation signal is inadequate for assessing PA pressure. Left Atrium: Left atrial size was normal in size. Right Atrium: Right atrial size was normal in size. Pericardium: There is no evidence of pericardial effusion. Mitral Valve: The mitral valve is normal in structure. Mild to moderate mitral valve regurgitation. No evidence of mitral valve stenosis. Tricuspid Valve: The tricuspid valve is normal in structure. Tricuspid valve regurgitation is not demonstrated. Aortic Valve: The aortic valve is tricuspid. Aortic valve regurgitation is not visualized. No aortic stenosis is present. Pulmonic Valve: The pulmonic valve was normal in structure. Pulmonic valve regurgitation is not visualized. Aorta: Aortic dilatation noted. There is mild dilatation of the aortic root, measuring 38 mm. Venous: The inferior vena cava is normal in size  with greater than 50% respiratory variability, suggesting right atrial pressure of 3 mmHg. IAS/Shunts: No atrial level shunt detected by color flow Doppler.  LEFT VENTRICLE PLAX 2D LVIDd:         6.40 cm  Diastology LVIDs:         6.10 cm  LV e'  medial:    6.08 cm/s LV PW:         1.00 cm  LV E/e' medial:  10.2 LV IVS:        0.80 cm  LV e' lateral:   4.87 cm/s LVOT diam:     2.00 cm  LV E/e' lateral: 12.8 LV SV:         34 LV SV Index:   18 LVOT Area:     3.14 cm  RIGHT VENTRICLE             IVC RV Basal diam:  1.70 cm     IVC diam: 1.30 cm RV S prime:     13.70 cm/s TAPSE (M-mode): 2.1 cm LEFT ATRIUM             Index       RIGHT ATRIUM          Index LA diam:        3.40 cm 1.78 cm/m  RA Area:     8.07 cm LA Vol (A2C):   36.9 ml 19.30 ml/m RA Volume:   13.10 ml 6.85 ml/m LA Vol (A4C):   20.4 ml 10.67 ml/m LA Biplane Vol: 27.2 ml 14.22 ml/m  AORTIC VALVE LVOT Vmax:   56.90 cm/s LVOT Vmean:  40.300 cm/s LVOT VTI:    0.107 m  AORTA Ao Root diam: 3.80 cm Ao Asc diam:  2.40 cm MITRAL VALVE MV Area (PHT): 2.62 cm      SHUNTS MV Decel Time: 289 msec      Systemic VTI:  0.11 m MR Peak grad:    85.7 mmHg   Systemic Diam: 2.00 cm MR Mean grad:    56.0 mmHg MR Vmax:         463.00 cm/s MR Vmean:        352.0 cm/s MR PISA:         0.25 cm MR PISA Eff ROA: 2 mm MR PISA Radius:  0.20 cm MV E velocity: 62.10 cm/s MV A velocity: 75.20 cm/s MV E/A ratio:  0.83 Marca Ancona MD Electronically signed by Marca Ancona MD Signature Date/Time: 02/17/2021/2:44:44 PM    Final     Cardiac Studies   none  Patient Profile     25 y.o. female admitted with CHF and PMVT/tdp  Assessment & Plan    1. PMVT/tdp - she is s/p DDD ICD and is doing well.  2. ICD - she is s/p DDD ICD and her cxr looks good. No PTX. ICD interrogation demonstrates normal device function. 3. Chronic systolic heart failure - she will restart her home meds. 4. Disp. - ok for DC home from EP perspective. Incision check in 1-2 weeks in the office.  Tylenol for pain.    For questions or updates, please contact [facility] Please consult [email or web address] for contact info under Cardiology/STEMI.      Signed, Lewayne Bunting, MD  02/19/2021, 8:34 AM  Patient ID: Jerene Dilling, female   DOB: 1995-12-18, 25 y.o.   MRN: [id number]

## 2021-02-19 NOTE — Discharge Summary (Addendum)
Discharge Summary    Patient ID: DELYLAH STANCZYK MRN: [id number]; DOB: 09/18/1996  Admit date: 02/16/2021 Discharge date: 02/19/2021  PCP:  Patient, [name]   [facility]  Cardiologist:  Little Ishikawa, MD  Advanced Practice Provider:  No care team member to display Electrophysiologist:  Lanier Prude, MD  Advanced Heart Failure Clinic:  Arvilla Meres, MD    {   Discharge Diagnoses    Principal Problem:   Torsades de pointes [facility]) Active Problems:   NICM (nonischemic cardiomyopathy) (HCC)   PVC's (premature ventricular contractions)    Diagnostic Studies/Procedures    1. ICD implant, see op note 02/18/21 2. 2D Echo 02/17/21  1. Left ventricular ejection fraction, by estimation, is 25%. The left  ventricle has severely decreased function. The left ventricle demonstrates  global hypokinesis. The left ventricular internal cavity size was  moderately dilated. Left ventricular  diastolic parameters are consistent with Grade I diastolic dysfunction  (impaired relaxation).   2. Right ventricular systolic function is normal. The right ventricular  size is normal. Tricuspid regurgitation signal is inadequate for assessing  PA pressure.   3. The mitral valve is normal in structure. Mild to moderate mitral valve  regurgitation. No evidence of mitral stenosis.   4. The aortic valve is tricuspid. Aortic valve regurgitation is not  visualized. No aortic stenosis is present.   5. Aortic dilatation noted. There is mild dilatation of the aortic root,  measuring 38 mm.   6. The inferior vena cava is normal in size with greater than 50%  respiratory variability, suggesting right atrial pressure of 3 mmHg.  _____________   History of Present Illness     [name] is a 25 y.o. female with prior Covid-infection October 2021, recently diagnosed systolic CHF/NICM, PVCs who presented to the hospital after turning in an event  monitor which reported 27 seconds of torsades.   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   She initially noted symptoms of lightheadedness in September 2021. She subsequently had COVID-19 infection in October 2021 and noted worsening shortness of breath, palpitations and lightheadedness. She had pesented to the ED in [place] on 11/11/2020 with chest pain and palpitations. CT chest showed no evidence of PE.  High-sensitivity troponins were elevated (72>> 73>> 68) but flat.  She was discharged from the ED with referral to cardiology for further evaluation.  She saw Dr. Sela Hua in [facility] on 11/19/2020 and echocardiogram and Holter monitor were ordered. Echo showed severe LV dilation, severe systolic dysfunction with an EF of 15 to 20%, normal RV function and no significant valvular disease.  Holter monitor showed rare ectopy and 6 beat run of NSVT. Given findings she was set up for a Lexiscan Myoview on 01/04/2021 which showed anterior perfusion defect concerning for ischemia and an EF of 26%. She went back to the ED on 01/09/2021 and underwent cardiac catheterization shortly thereafter that showed normal coronaries and normal LVEDP. Locally she was recently seen by Dr. Bjorn Pippin. Cardiac MRI showed EF of 31%, no evidence for late gadolinium enhancement, normal RV.  She was referred to advanced heart failure and seen was by Dr. Gala Romney on 01/27/2021. She had reported feeling better talking walks and riding her exercise bike but would still get occasional dizziness and runs of PVCs. Carvedilol was titrated and spironolactone added. She presented to the ED on 02/10/2021 with complaints of palpitations, and "  feeling unwell".  Also complained of a fluttering in her chest and dizziness.  She was observed in the ER and noted to have PVCs on telemetry but no significant arrhythmias.  She was instructed to continue wearing her cardiac  monitor.   Upon completion of her monitor, Advanced Heart Failure clinic received a call from [place] day of admission stating patient had multiple episodes of VT and torsades. Patient was subsequently asked to return to the emergency department.  Hospital Course     1.  Palpations/dizziness with Torsades/VT in the setting of NICM:  - pertinent labs included K 3.8, Mg 2.2, Covid negative - QTC was approximately - 2D echo 02/17/21 showed continued LV dysfunction with EF 25%, grade 1 DD, mild-moderate MR, mild dilation of the aortic root - per EP consult note by [name], "Given the majority of the nonsustained, polymorphic ventricular tachycardia's have occurred at night during a resting bradycardia, I will plan to implant a traditional transvenous ICD with an atrial lead to allow overdrive suppression pacing.  I will plan to refer her to our geneticist to evaluate for possible SCD syndromes although I do not have a high suspicion for this. I will plan to start amiodarone after the ICD is implanted to help suppress her PVCs and ventricular arrhythmias.  We discussed the risks of amiodarone long-term use.  We will plan to discontinue this medication 3 to 6 months after starting it. We will refer her to Georgiann Mohs, PsyD to help her with the emotional toll her illness has taken. " (Per EP APP note, Renee sent [name] RN a staff message for the genetics referral and psychiatry.) - patient underwent ICD implantation with [facility] Scientific single coil ICD 02/18/21 and tolerated this well - discharge recommendations reviewed with Drs. Ladona Ridgel and Dr. Gala Romney. Per Dr. Ladona Ridgel, he recommended to hold off starting amiodarone or mexilitene at this time but suggests follow-up with [name] to discuss further - I sent message as outlined below - started on KCl daily per d/w MDs - consider repeat BMET at f/u OV - Dr. Ladona Ridgel is OK with her resuming home oral contraceptics - patient advised no  driving x 6 months   2.  Recently diagnosed chronic systolic heart failure, nonischemic:  - due to NICM, suspect viral (?COVID) myocarditis - Echo 1/22 EF 15- 20% - Holter monitor 1/22 showed rare ectopy and 6 beats run of NSVT.   Eugenie Birks Myoview 3/22 EF 26% with anterior perfusion defect concerning for ischemia - Cardiac catheterization 01/10/2021 in [place] showed normal coronary arteries, normal LVEDP.  - cMRI 01/26/21 LVEF 31% markedly dilated No significant LGE. RV normal. No non-compaction or other pathology - 2D Echo 02/17/21 EF 25% - Volume status stable this admission - Continue current dose of Coreg, Entresto, spironolactone, and Jardiance. No room to titrate at this point per Dr. Gala Romney  3.  PVCs - will continue beta bocker as above  The EP team has outlined instructions on AVS and also arranged wound check and 3 month follow-up with [name]. As Dr. Ladona Ridgel recommends for the patient to follow up with [name] as an outpatient to discuss amiodarone initiation, I sent a message to our EP scheduler and [name] to assist with timing of scheduling.  The patient also has a follow-up with Dr. Gala Romney scheduled 03/02/21 which we will keep. Dr. Ladona Ridgel and Dr. Gala Romney have seen and examined the patient today and feels she is stable for discharge.  Did  the patient have an acute coronary syndrome (MI, NSTEMI, STEMI, etc) this admission?:  No                               Did the patient have a percutaneous coronary intervention (stent / angioplasty)?:  No.    _____________  Discharge Vitals Blood pressure 136/78, pulse 80, temperature 98.5 F (36.9 C), temperature source Oral, resp. rate 16, height 5\' 6"  (1.676 m), weight 81.1 kg, SpO2 97 %.  Filed Weights   02/16/21 1915 02/17/21 1051 02/18/21 0555  Weight: 82 kg 81.6 kg 81.1 kg    Labs & Radiologic Studies    CBC Recent Labs    02/18/21 0451  WBC 6.9  HGB 13.6  HCT 41.3  MCV 85.9  PLT 293   Basic Metabolic  Panel Recent Labs    02/16/21 1854 02/17/21 0500 02/18/21 0451  NA 137 136 133*  K 3.8 4.1 3.7  CL 102 104 103  CO2 27 24 24   GLUCOSE 90 92 102*  BUN 11 10 13   CREATININE 0.86 0.74 0.81  CALCIUM 9.1 9.0 8.9  MG 2.2  --   --    Liver Function Tests Recent Labs    02/16/21 1854  AST 18  ALT 14  ALKPHOS 42  BILITOT 0.9  PROT 7.5  ALBUMIN 4.0   High Sensitivity Troponin:   Recent Labs  Lab 02/10/21 1318 02/10/21 1830  TROPONINIHS 15 15    _____________  DG Chest 2 View  Result Date: 02/19/2021 CLINICAL DATA:  Post ICD EXAM: CHEST - 2 VIEW COMPARISON:  Chest x-rays dated 02/10/2021 and 09/05/2006. FINDINGS: New LEFT chest wall ICD apparatus in place. No pleural effusion or pneumothorax is seen. Lungs are clear. Osseous structures about the chest are unremarkable. IMPRESSION: 1. New ICD hardware in place. No evidence of procedural complicating feature. 2. Lungs are clear.  No evidence of pneumonia or pulmonary edema. Electronically Signed   By: Bary Richard M.D.   On: 02/19/2021 07:11   DG Chest 2 View  Result Date: 02/10/2021 CLINICAL DATA:  Chest pain EXAM: CHEST - 2 VIEW COMPARISON:  September 05, 2006. FINDINGS: Lungs are clear. Heart size and pulmonary vascularity are normal. No adenopathy. Monitor device overlying the left chest anteriorly. No bone lesions. IMPRESSION: Lungs clear.  Heart size normal. Electronically Signed   By: Bretta Bang III M.D.   On: 02/10/2021 13:53   MR CARDIAC MORPHOLOGY W WO CONTRAST  Result Date: 01/28/2021 CLINICAL DATA:  23F with HFrEF (EF 15-20% on echo) EXAM: CARDIAC MRI TECHNIQUE: The patient was scanned on a 1.5 Tesla Siemens magnet. A dedicated cardiac coil was used. Functional imaging was done using Fiesta sequences. 2,3, and 4 chamber views were done to assess for RWMA's. Modified Simpson's rule using a short axis stack was used to calculate an ejection fraction on a dedicated work [employer]. The patient received  8 cc of Gadavist. After 10 minutes inversion recovery sequences were used to assess for infiltration and scar tissue. CONTRAST:  8 cc  of Gadavist FINDINGS: Left ventricle: -Severe dilatation -Moderate systolic dysfunction -Nonspecific elevation in  ECV (30%).  Normal T2 (14ms) -No LGE LV EF:  31% (Normal 56-78%) Absolute volumes: LV EDV: (Normal 52-141 mL) LV ESV: (Normal 13-51 mL) LV SV: (Normal 33-97 mL) CO: 6.4L/min (Normal 2.7-6.0 L/min) Indexed volumes: LV EDV: 1107mL/sq-m (Normal 41-81 mL/sq-m) LV ESV: 137mL/sq-m (  Normal 12-21 mL/sq-m) LV SV: 66mL/sq-m (Normal 26-56 mL/sq-m) CI: 3.2L/min/sq-m (Normal 1.8-3.8 L/min/sq-m) Right ventricle: Mild dilatation with normal systolic function RV EF: 55% (Normal 47-80%) Absolute volumes: RV EDV: (Normal 58-154 mL) RV ESV: 85mL (Normal 12-68 mL) RV SV: (Normal 35-98 mL) CO: 6.5L/min (Normal 2.7-6 L/min) Indexed volumes: RV EDV: 71mL/sq-m (Normal 48-87 mL/sq-m) RV ESV: 47mL/sq-m (Normal 11-28 mL/sq-m) RV SV: 14mL/sq-m (Normal 27-57 mL/sq-m) CI: 3.3L/min/sq-m (Normal 1.8-3.8 L/min/sq-m) Left atrium: Mild dilatation Right atrium: Normal size Mitral valve: Trivial regurgitation Aortic valve: Tricuspid.  No regurgitation Tricuspid valve: No regurgitation Pulmonic valve: No regurgitation Pericardium: Small effusion measuring up to 8mm adjacent to LV inferior wall IMPRESSION: 1. Severe LV dilatation (EDVI 167 mL/m^2) with moderate systolic dysfunction (EF 31%) 2.  Mild RV dilatation with normal systolic function (EF 55%) 3.  No late gadolinium enhancement 4.  Small pericardial effusion Electronically Signed   By: Epifanio Lesches MD   On: 01/28/2021 00:17   ECHOCARDIOGRAM COMPLETE  Result Date: 02/17/2021    ECHOCARDIOGRAM REPORT   Patient Name:   MAGDALA BRAHMBHATT Date of Exam: 02/17/2021 Medical Rec #:  [id number]           Height:       66.0 in Accession #:    [id number]          Weight:       180.0 lb Date of Birth:  [birth year]            BSA:          1.912 m Patient Age:    24 years            BP:           111/79 mmHg Patient Gender: F                   HR:           81 bpm. Exam Location:  Inpatient Procedure: 2D Echo, Cardiac Doppler and Color Doppler                        STAT ECHO Reported to: Dr. Marca Ancona on 02/17/2021 2:15:00 PM. Indications:    Cardiac Arrest I46.9  History:        Patient has no prior history of Echocardiogram examinations.  Sonographer:    Tiffany Dance Referring Phys: [id number] Lanier Prude IMPRESSIONS  1. Left ventricular ejection fraction, by estimation, is 25%. The left ventricle has severely decreased function. The left ventricle demonstrates global hypokinesis. The left ventricular internal cavity size was moderately dilated. Left ventricular diastolic parameters are consistent with Grade I diastolic dysfunction (impaired relaxation).  2. Right ventricular systolic function is normal. The right ventricular size is normal. Tricuspid regurgitation signal is inadequate for assessing PA pressure.  3. The mitral valve is normal in structure. Mild to moderate mitral valve regurgitation. No evidence of mitral stenosis.  4. The aortic valve is tricuspid. Aortic valve regurgitation is not visualized. No aortic stenosis is present.  5. Aortic dilatation noted. There is mild dilatation of the aortic root, measuring 38 mm.  6. The inferior vena cava is normal in size with greater than 50% respiratory variability, suggesting right atrial pressure of 3 mmHg. FINDINGS  Left Ventricle: Left ventricular ejection fraction, by estimation, is 25%. The left ventricle has severely decreased function. The left ventricle demonstrates global hypokinesis. The left ventricular internal cavity size was moderately dilated. There is  no left  ventricular hypertrophy. Left ventricular diastolic parameters are consistent with Grade I diastolic dysfunction (impaired relaxation). Right Ventricle: The right ventricular size is normal. No  increase in right ventricular wall thickness. Right ventricular systolic function is normal. Tricuspid regurgitation signal is inadequate for assessing PA pressure. Left Atrium: Left atrial size was normal in size. Right Atrium: Right atrial size was normal in size. Pericardium: There is no evidence of pericardial effusion. Mitral Valve: The mitral valve is normal in structure. Mild to moderate mitral valve regurgitation. No evidence of mitral valve stenosis. Tricuspid Valve: The tricuspid valve is normal in structure. Tricuspid valve regurgitation is not demonstrated. Aortic Valve: The aortic valve is tricuspid. Aortic valve regurgitation is not visualized. No aortic stenosis is present. Pulmonic Valve: The pulmonic valve was normal in structure. Pulmonic valve regurgitation is not visualized. Aorta: Aortic dilatation noted. There is mild dilatation of the aortic root, measuring 38 mm. Venous: The inferior vena cava is normal in size with greater than 50% respiratory variability, suggesting right atrial pressure of 3 mmHg. IAS/Shunts: No atrial level shunt detected by color flow Doppler.  LEFT VENTRICLE PLAX 2D LVIDd:         6.40 cm  Diastology LVIDs:         6.10 cm  LV e' medial:    6.08 cm/s LV PW:         1.00 cm  LV E/e' medial:  10.2 LV IVS:        0.80 cm  LV e' lateral:   4.87 cm/s LVOT diam:     2.00 cm  LV E/e' lateral: 12.8 LV SV:         34 LV SV Index:   18 LVOT Area:     3.14 cm  RIGHT VENTRICLE             IVC RV Basal diam:  1.70 cm     IVC diam: 1.30 cm RV S prime:     13.70 cm/s TAPSE (M-mode): 2.1 cm LEFT ATRIUM             Index       RIGHT ATRIUM          Index LA diam:        3.40 cm 1.78 cm/m  RA Area:     8.07 cm LA Vol (A2C):   36.9 ml 19.30 ml/m RA Volume:   13.10 ml 6.85 ml/m LA Vol (A4C):   20.4 ml 10.67 ml/m LA Biplane Vol: 27.2 ml 14.22 ml/m  AORTIC VALVE LVOT Vmax:   56.90 cm/s LVOT Vmean:  40.300 cm/s LVOT VTI:    0.107 m  AORTA Ao Root diam: 3.80 cm Ao Asc diam:  2.40 cm  MITRAL VALVE MV Area (PHT): 2.62 cm      SHUNTS MV Decel Time: 289 msec      Systemic VTI:  0.11 m MR Peak grad:    85.7 mmHg   Systemic Diam: 2.00 cm MR Mean grad:    56.0 mmHg MR Vmax:         463.00 cm/s MR Vmean:        352.0 cm/s MR PISA:         0.25 cm MR PISA Eff ROA: 2 mm MR PISA Radius:  0.20 cm MV E velocity: 62.10 cm/s MV A velocity: 75.20 cm/s MV E/A ratio:  0.83 Marca Ancona MD Electronically signed by Marca Ancona MD Signature Date/Time: 02/17/2021/2:44:44 PM    Final    LONG  TERM MONITOR (3-14 DAYS)  Result Date: 01/31/2021  6 episodes of NSVT, longest lasting 13 seconds  Occasional PVCs (1.1%)  Patch Wear Time:  3 days and 2 hours (2022-03-18T13:37:16-0400 to 2022-03-21T15:56:16-0400) Patient had a min HR of 46 bpm, max HR of 255 bpm, and avg HR of 69 bpm. Predominant underlying rhythm was Sinus Rhythm. QRS morphology changes were present throughout recording. 6 Ventricular Tachycardia runs occurred, the run with the fastest interval lasting 4 beats with a max rate of 255 bpm, the longest lasting 13.1 secs with an avg rate of 129 bpm. Ventricular Tachycardia was detected within +/- 45 seconds of symptomatic patient event(s). Isolated SVEs were rare (<1.0%), and no SVE Couplets or SVE  Triplets were present. Isolated VEs were occasional (1.1%, 3455), VE Couplets were rare (<1.0%, 58), and VE Triplets were rare (<1.0%, 5). Ventricular Bigeminy was present. 61 triggered events, corresponding to sinus rhythm +/- PACs/PVCs and NSVT.   Disposition   Pt is being discharged home today in good condition.  Follow-up Plans & Appointments     Follow-up Information     [facility] [address] [facility] up.   Specialty: Cardiology Why: 03/03/21 @ 10:40AM, wound check visit Contact information: [address], [address] [place] [id number] [phone]        Lanier PrudeLambert, Cameron T, MD Follow up.   Specialties: Cardiology, Radiology Why: 06/03/21 @ 2:30PM for ICD  check. The office will also call you to schedule a sooner appointment as well for close EP follow-up. Contact information: [address] [place] [address] [phone]         Bensimhon, Bevelyn Bucklesaniel R, MD Follow up.   Specialty: Cardiology Why: Advanced Heart Failure clinic - keep follow-up scheduled on Wednesday Mar 02, 2021 at 3:20 PM Contact information: [address] [place] [address] [phone]                Discharge Instructions     (HEART FAILURE PATIENTS) Call MD:  Anytime you have any of the following symptoms: 1) 3 pound weight gain in 24 hours or 5 pounds in 1 week 2) shortness of breath, with or without a dry hacking cough 3) swelling in the hands, feet or stomach 4) if you have to sleep on extra pillows at night in order to breathe.   Complete by: As directed    Call MD for:  extreme fatigue   Complete by: As directed    Call MD for:  persistant dizziness or light-headedness   Complete by: As directed    Call MD for:  redness, tenderness, or signs of infection (pain, swelling, redness, odor or green/yellow discharge around incision site)   Complete by: As directed    Diet - low sodium heart healthy   Complete by: As directed    Discharge instructions   Complete by: As directed    You were started on a low dose potassium supplement.   Discharge wound care:   Complete by: As directed    Please see attached sheet at the end of your After-Visit Summary for instructions on wound care.   Increase activity slowly   Complete by: As directed    Please see attached sheet at the end of your After-Visit Summary for instructions on wound care, activity with your device, and bathing.  Due to your heart rhythm issue, the electrophysiology team has recommended no driving for 6 months.       Discharge  Medications   Allergies as of 02/19/2021   No Known Allergies      Medication List     TAKE these medications     carvedilol 6.25 MG tablet Commonly known as: COREG Take 1 tablet (6.25 mg total) by mouth in the morning and at bedtime.   empagliflozin 10 MG Tabs tablet Commonly known as: JARDIANCE Take 10 mg by mouth daily.   Entresto 24-26 MG Generic drug: sacubitril-valsartan Take 1 tablet by mouth 2 (two) times daily.   potassium chloride SA 20 MEQ tablet Commonly known as: KLOR-CON Take 1 tablet (20 mEq total) by mouth daily.   spironolactone 25 MG tablet Commonly known as: ALDACTONE Take 0.5 tablets (12.5 mg total) by mouth at bedtime.   Tri-Estarylla 0.18/0.215/0.25 MG-35 MCG tablet Generic drug: Norgestimate-Ethinyl Estradiol Triphasic Take 1 tablet by mouth daily.   valACYclovir 1000 MG tablet Commonly known as: VALTREX Take 1,000 mg by mouth as needed for rash.               Discharge Care Instructions  (From admission, onward)           Start     Ordered   02/19/21 0000  Discharge wound care:       Comments: Please see attached sheet at the end of your After-Visit Summary for instructions on wound care.   02/19/21 1007               Outstanding Labs/Studies   Consider BMET in follow-up to recheck potassium  Duration of Discharge Encounter   Greater than 30 minutes including physician time.  Signed, Laurann Montana, PA-C 02/19/2021, 10:41 AM  EP Attending  Patient seen and examined. Agree with above. The patient is stable for DC home. Her bp is back up. Her ventricular arrhythmias have responded nicely to atrial pacing and with prior PMVT I think the short term risk/benefit of amiodarone initiation is unlikely to be helpful. She will need aggressive medical therapy for her chronic systolic heart failure. If we know that she is compliant and her VT returns then a trial of amiodarone would be a consideration. Note plans for genetic evaluation.   Sharlot Gowda Maizie Garno,MD

## 2021-02-19 NOTE — Telephone Encounter (Signed)
Patient called stating that she had an ICD placed yesterday and went home today and now has blisters at the site of the tegaderm and complains of pain in the shoulder.  Instructed her to remove the tegaderm and place nonstick gauze and gauze tape for a few days. Call if site becomes red, drainage or worsening pain occurs. Discussed plan with patient and her mom and will followup with wound check appt.

## 2021-02-19 NOTE — Progress Notes (Signed)
Pt got discharged to home, discharge instructions provided and patient showed understanding to it, IV taken out,Telemonitor DC,pt left unit in wheelchair with all of the belongings accompanied with a family member  (Mother)  Loralai Eisman, RN 

## 2021-02-19 NOTE — Plan of Care (Signed)

## 2021-02-21 ENCOUNTER — Encounter (HOSPITAL_COMMUNITY): Payer: Self-pay | Admitting: Cardiology

## 2021-02-21 ENCOUNTER — Telehealth: Payer: Self-pay

## 2021-02-21 DIAGNOSIS — I4721 Torsades de pointes: Secondary | ICD-10-CM

## 2021-02-21 DIAGNOSIS — I472 Ventricular tachycardia: Secondary | ICD-10-CM

## 2021-02-21 DIAGNOSIS — I428 Other cardiomyopathies: Secondary | ICD-10-CM

## 2021-02-21 MED FILL — Gentamicin Sulfate Inj 40 MG/ML: INTRAMUSCULAR | Qty: 80 | Status: AC

## 2021-02-21 MED FILL — Lidocaine HCl Local Inj 1%: INTRAMUSCULAR | Qty: 80 | Status: AC

## 2021-02-21 NOTE — Telephone Encounter (Signed)
Referrals placed 

## 2021-02-21 NOTE — Telephone Encounter (Signed)
-----   Message from [facility] sent at 02/17/2021  4:09 PM EDT ----- CL asked if you can set her up for genetics referral and psych with [name] also please  NICM VT Torsades  Thanks!!

## 2021-02-23 ENCOUNTER — Encounter (HOSPITAL_COMMUNITY): Payer: Self-pay

## 2021-02-23 ENCOUNTER — Telehealth: Payer: Self-pay | Admitting: Cardiology

## 2021-02-23 NOTE — Telephone Encounter (Signed)
Caller is not on DPR, call returned to patient, requested she notify mother of callback.  Pt also sent mychart message with pictures of left swelling.   No real discernable swelling present, some discoloration noted in left arm.    Pt reports pain with movement in left arm.  Educated patient on continuing use and the activities she needs to restrict.    Some bruising is expected with new device, educated on s/s of worsening condition to monitor for and report.

## 2021-02-23 NOTE — Telephone Encounter (Signed)
  1. Has your device fired? no  2. Is you device beeping? no  3. Are you experiencing draining or swelling at device site? no  4. Are you calling to see if we received your device transmission? no  5. Have you passed out? no  Patient's mother states the patient's arm is pinkish and purple complexion. She states it is not at the device site and she is not having any swelling. She would like to know if the patient needs to be seen for this or if it is normal.   Please route to Device Clinic Pool

## 2021-02-27 NOTE — Progress Notes (Signed)
ADVANCED HF CLINIC NOTE  PCP: Patient, [name] Primary Cardiologist: Little Ishikawa, MD  HPI:  Mary Torbert Harperis a 25 y.o.femalewith systolic HF due to presumed viral myocarditis, VT s/p ICD.  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  Had COVID-19 10/21 (vaccianted not boosted).  She presented to the ED in [place] on 11/11/2020 with CP and palpitations. Chest CT no evidence of PE. Hs trops (72 >73 >68).  Echocardiogram 12/03/2020  EF 15-20% normal RV, no significant valvular dz. Holter with rare ectopy and 6 beatsrunof NSVT. Myoview with anterior defect, Cath 01/10/2021 showed normal coronary arteries, normal LVEDP.   cMRI 01/26/21 LVEF 31% markedly dilated No significant LGE. RV normal. No non-compaction or other pathology  I saw her in 3/22 and placed a Zio patch to quantify PVC burden. ZIo showed multiple short runs VT/TdP. Admitted for ICD. CRT-D placed 02/18/21 with RA lead to overdrive pace given TdP occurred during nighttime bradycardia .  Here for f/u with her mom. Having some swelling in LUE. Otherwise ICD site is healing well. Feeling better but still with occasional palpitations. Had episode of severe palpitations today and had to sit down due to dizziness. Not very active yet. No edema, orthopnea or PND. Says she is having some twitching.    ROS: All systems negative except as listed in HPI, PMH and Problem List.  SH:  Social History   Socioeconomic History  . Marital status: Single    Spouse name: Not on file  . Number of children: Not on file  . Years of education: Not on file  . Highest education level: Not on file  Occupational History  . Not on file  Tobacco Use  . Smoking status: Never Smoker  . Smokeless tobacco: Never Used  Substance and Sexual Activity  . Alcohol use: No  . Drug use: No  . Sexual activity: Never  Other Topics Concern  . Not on file   Social History Narrative   [employer] highschool   10th grade.   volleyball   Social Determinants of Corporate investment banker Strain: Not on file  Food Insecurity: Not on file  Transportation Needs: Not on file  Physical Activity: Not on file  Stress: Not on file  Social Connections: Not on file  Intimate Partner Violence: Not on file    FH:  Family History  Problem Relation Age of Onset  . Hyperlipidemia Maternal Grandfather     Past Medical History:  Diagnosis Date  . Lactose intolerance   . NICM (nonischemic cardiomyopathy) (HCC)   . PVC's (premature ventricular contractions)   . Torsades de pointes [facility])     Current Outpatient Medications  Medication Sig Dispense Refill  . carvedilol (COREG) 6.25 MG tablet Take 1 tablet (6.25 mg total) by mouth in the morning and at bedtime. 60 tablet 11  . empagliflozin (JARDIANCE) 10 MG TABS tablet Take 10 mg by mouth daily.    Marland Kitchen ENTRESTO 24-26 MG Take 1 tablet by mouth 2 (two) times daily.    . potassium chloride SA (KLOR-CON) 20 MEQ tablet Take 1 tablet (20 mEq total) by mouth daily. 30 tablet 3  . spironolactone (ALDACTONE) 25 MG tablet Take 0.5 tablets (12.5 mg total) by mouth at bedtime. 45 tablet 3  . TRI-ESTARYLLA 0.18/0.215/0.25 MG-35 MCG tablet Take 1 tablet by mouth daily.    . valACYclovir (VALTREX) 1000 MG tablet Take 1,000 mg by  mouth as needed for rash.     No current facility-administered medications for this encounter.    Vitals:   03/02/21 1541  BP: 110/78  Pulse: 88  SpO2: 97%  Weight: 82.6 kg (182 lb 3.2 oz)    PHYSICAL EXAM:  General:  Well appearing. No resp difficulty HEENT: normal Neck: supple. no JVD. Carotids 2+ bilat; no bruits. No lymphadenopathy or thryomegaly appreciated. Cor: PMI nondisplaced. Regular rate & rhythm. No rubs, gallops or murmurs. Wound site ok Steri-strips in place  Lungs: clear Abdomen: obese soft, nontender, nondistended. No hepatosplenomegaly. No bruits or masses. Good  bowel sounds. Extremities: no cyanosis, clubbing, rash, edema Mild edema LUE with prominent venous varicosities.  Neuro: alert & orientedx3, cranial nerves grossly intact. moves all 4 extremities w/o difficulty. Affect pleasant  ICD interrogation: No VT/AF. Personally reviewed  ASSESSMENT & PLAN:  1. Chronic HFrEF - due to NICM. Suspect viral (? COVID) myocarditis -Echo 1/22 EF 15-20% -[place], normal LV[place]P.  -cMRI 01/26/21 LVEF 31% markedly dilated No significant LGE. RV normal. No non-compaction or other pathology - Volume status stable. Volume status looks good.  - Continue Jardiance 10 - Continue Entresto 24/26 bid - Continue spiro 12.5 - Continue carvedilol 6.25 bid  2.Torsades/VT - Zio 3/22 with multiple episodes VT/TdP. PVCs 1% - CRT-D placed 02/18/21 with RA lead to overdrive pace given TdP occurred during nighttime bradycardia . - No driving for 6 months. - ICD site looks good. Formal wound check tomorrow - No events on ICD interrogation today.  - Will get LUE u/s to exclude DVT  3. LUE edema  - Will get LUE u/s to exclude DVT  Arvilla Meres, MD  4:17 PM

## 2021-02-28 ENCOUNTER — Ambulatory Visit: Payer: BC Managed Care – PPO | Admitting: Cardiology

## 2021-03-02 ENCOUNTER — Ambulatory Visit (HOSPITAL_COMMUNITY)
Admission: RE | Admit: 2021-03-02 | Discharge: 2021-03-02 | Disposition: A | Discharge status: Home / Self Care | Payer: BC Managed Care – PPO | Source: Ambulatory Visit | Attending: Internal Medicine | Admitting: Internal Medicine

## 2021-03-02 ENCOUNTER — Encounter (HOSPITAL_COMMUNITY): Payer: Self-pay | Admitting: Internal Medicine

## 2021-03-02 ENCOUNTER — Ambulatory Visit (HOSPITAL_BASED_OUTPATIENT_CLINIC_OR_DEPARTMENT_OTHER)
Admission: RE | Admit: 2021-03-02 | Discharge: 2021-03-02 | Disposition: A | Discharge status: Home / Self Care | Payer: BC Managed Care – PPO | Source: Ambulatory Visit | Attending: Internal Medicine | Admitting: Internal Medicine

## 2021-03-02 ENCOUNTER — Encounter (HOSPITAL_COMMUNITY): Payer: Self-pay

## 2021-03-02 ENCOUNTER — Other Ambulatory Visit: Payer: Self-pay

## 2021-03-02 VITALS — BP 110/78 | HR 88 | Wt 182.2 lb

## 2021-03-02 DIAGNOSIS — Z79899 Other long term (current) drug therapy: Secondary | ICD-10-CM | POA: Diagnosis not present

## 2021-03-02 DIAGNOSIS — Z8616 Personal history of COVID-19: Secondary | ICD-10-CM | POA: Diagnosis not present

## 2021-03-02 DIAGNOSIS — I82A12 Acute embolism and thrombosis of left axillary vein: Secondary | ICD-10-CM | POA: Diagnosis not present

## 2021-03-02 DIAGNOSIS — R42 Dizziness and giddiness: Secondary | ICD-10-CM | POA: Diagnosis not present

## 2021-03-02 DIAGNOSIS — Z7901 Long term (current) use of anticoagulants: Secondary | ICD-10-CM | POA: Diagnosis not present

## 2021-03-02 DIAGNOSIS — R600 Localized edema: Secondary | ICD-10-CM

## 2021-03-02 DIAGNOSIS — R002 Palpitations: Secondary | ICD-10-CM | POA: Insufficient documentation

## 2021-03-02 DIAGNOSIS — I5022 Chronic systolic (congestive) heart failure: Secondary | ICD-10-CM

## 2021-03-02 DIAGNOSIS — I4721 Torsades de pointes: Secondary | ICD-10-CM

## 2021-03-02 DIAGNOSIS — I428 Other cardiomyopathies: Secondary | ICD-10-CM

## 2021-03-02 DIAGNOSIS — I472 Ventricular tachycardia: Secondary | ICD-10-CM

## 2021-03-02 DIAGNOSIS — M7989 Other specified soft tissue disorders: Secondary | ICD-10-CM

## 2021-03-02 DIAGNOSIS — R253 Fasciculation: Secondary | ICD-10-CM | POA: Insufficient documentation

## 2021-03-02 MED ORDER — APIXABAN (ELIQUIS) VTE STARTER PACK (10MG AND 5MG): ORAL_TABLET | ORAL | 0 refills | Status: DC

## 2021-03-02 MED ORDER — APIXABAN 5 MG PO TABS
5.0000 mg | ORAL_TABLET | Freq: Two times a day (BID) | ORAL | 6 refills | Status: DC
Start: 2021-04-01 — End: 2021-05-16

## 2021-03-02 NOTE — Patient Instructions (Addendum)
Start Eliquis Twice daily TODAY  Take 10 mg Twice daily for 7 days, then on day 8 decrease to 5 mg Twice daily    Left arm ultrasound today  You have been referred to Cardiac Rehab, they will call you to schedule this  Your physician recommends that you schedule a follow-up appointment in: 2 months  If you have any questions or concerns before your next appointment please send [place] a message through [place] or call our office at [phone].    TO LEAVE A MESSAGE FOR THE NURSE SELECT OPTION 2, PLEASE LEAVE A MESSAGE INCLUDING: . YOUR NAME . DATE OF BIRTH . CALL BACK NUMBER . REASON FOR CALL**this is important as we prioritize the call backs  YOU WILL RECEIVE A CALL BACK THE SAME DAY AS LONG AS YOU CALL BEFORE 4:00 PM  At the [facility], you and your health needs are our priority. As part of our continuing mission to provide you with exceptional heart care, we have created designated Provider Care Teams. These Care Teams include your primary Cardiologist (physician) and Advanced Practice Providers (APPs- Physician Assistants and Nurse Practitioners) who all work together to provide you with the care you need, when you need it.   You may see any of the following providers on your designated Care Team at your next follow up: Marland Kitchen Dr Arvilla Meres . Dr Marca Ancona . Dr Thornell Mule . Tonye Becket, NP . Robbie Lis, PA . Shanda Bumps [place],NP . Karle Plumber, PharmD   Please be sure to bring in all your medications bottles to every appointment.

## 2021-03-02 NOTE — Addendum Note (Signed)
Encounter addended by: Noralee Space, RN on: 03/02/2021 5:28 PM  Actions taken: Clinical Note Signed, Order list changed

## 2021-03-02 NOTE — Addendum Note (Signed)
Encounter addended by: Dolores Patty, MD on: 03/02/2021 6:05 PM  Actions taken: Charge Capture section accepted

## 2021-03-02 NOTE — Addendum Note (Signed)
Encounter addended by: Dolores Patty, MD on: 03/02/2021 4:50 PM  Actions taken: Level of Service modified, Visit diagnoses modified

## 2021-03-02 NOTE — Progress Notes (Signed)
LUE venous duplex has been completed. Preliminary results given to Dr. Gala Romney.  Results can be found under chart review under CV PROC. 03/02/2021 5:39 PM Louvina Cleary RVT, RDMS

## 2021-03-03 ENCOUNTER — Ambulatory Visit (INDEPENDENT_AMBULATORY_CARE_PROVIDER_SITE_OTHER): Payer: BC Managed Care – PPO | Admitting: Emergency Medicine

## 2021-03-03 DIAGNOSIS — I428 Other cardiomyopathies: Secondary | ICD-10-CM | POA: Diagnosis not present

## 2021-03-03 LAB — CUP PACEART INCLINIC DEVICE CHECK
Brady Statistic RA Percent Paced: 63 %
Brady Statistic RV Percent Paced: 1 %
Date Time Interrogation Session: 20220428120201
HighPow Impedance: 72 Ohm
Implantable Lead Implant Date: 20220415
Implantable Lead Implant Date: 20220415
Implantable Lead Location: 753859
Implantable Lead Location: 753860
Implantable Lead Model: 672
Implantable Lead Model: 7841
Implantable Lead Serial Number: 1134026
Implantable Lead Serial Number: 165867
Implantable Pulse Generator Implant Date: 20220415
Lead Channel Impedance Value: 507 Ohm
Lead Channel Impedance Value: 556 Ohm
Lead Channel Pacing Threshold Amplitude: 0.5 V
Lead Channel Pacing Threshold Amplitude: 0.7 V
Lead Channel Pacing Threshold Pulse Width: 0.4 ms
Lead Channel Pacing Threshold Pulse Width: 0.4 ms
Lead Channel Sensing Intrinsic Amplitude: 5.2 mV
Lead Channel Sensing Intrinsic Amplitude: 8.8 mV
Lead Channel Setting Pacing Amplitude: 3.5 V
Lead Channel Setting Pacing Amplitude: 3.5 V
Lead Channel Setting Pacing Pulse Width: 0.4 ms
Lead Channel Setting Sensing Sensitivity: 0.5 mV
Pulse Gen Serial Number: 597305

## 2021-03-03 NOTE — Progress Notes (Signed)
Wound check appointment. Steri-strips removed. Wound without redness or edema. Incision edges approximated, wound well healed. Normal device function. Thresholds, sensing, and impedances consistent with implant measurements. Device programmed at 3.5V for extra safety margin until 3 month visit. Histogram distribution appropriate for patient and level of activity. No mode switches or ventricular arrhythmias noted. PVC counters consistent with previous reports. Patient enrolled in remote follow ups with next scheduled transmission 05/23/21. ROV office visit with [name] 03/08/21. Patient educated about wound care, arm mobility, lifting restrictions, shock plan.

## 2021-03-03 NOTE — Patient Instructions (Signed)
Please call device clinic if you have any questions or concerns at [phone]

## 2021-03-04 ENCOUNTER — Telehealth: Payer: Self-pay

## 2021-03-04 NOTE — Telephone Encounter (Signed)
Poke with pt.  After viewing photos that pt sent in of site, Advised pt to wash area with soap and water since she has not done that and advised of s/s of infection to monitor for.

## 2021-03-04 NOTE — Telephone Encounter (Signed)
The patient left a message stating her incision site looks red and crusty. She sent 2 pictures in my-chart. She would like for the nurse to call her back. She states if she do not answer the nurse can call her mom. The patient number is [phone] and her mom Rinaldo Cloud number is [phone].

## 2021-03-07 ENCOUNTER — Ambulatory Visit (INDEPENDENT_AMBULATORY_CARE_PROVIDER_SITE_OTHER): Payer: BC Managed Care – PPO | Admitting: Psychologist

## 2021-03-07 DIAGNOSIS — F320 Major depressive disorder, single episode, mild: Secondary | ICD-10-CM | POA: Diagnosis not present

## 2021-03-07 DIAGNOSIS — F4521 Hypochondriasis: Secondary | ICD-10-CM | POA: Diagnosis not present

## 2021-03-08 ENCOUNTER — Ambulatory Visit (INDEPENDENT_AMBULATORY_CARE_PROVIDER_SITE_OTHER): Payer: BC Managed Care – PPO | Admitting: Cardiology

## 2021-03-08 ENCOUNTER — Encounter: Payer: Self-pay | Admitting: Cardiology

## 2021-03-08 ENCOUNTER — Other Ambulatory Visit: Payer: Self-pay

## 2021-03-08 VITALS — BP 118/86 | HR 92 | Ht 66.0 in | Wt 181.2 lb

## 2021-03-08 DIAGNOSIS — I472 Ventricular tachycardia: Secondary | ICD-10-CM | POA: Diagnosis not present

## 2021-03-08 DIAGNOSIS — I82622 Acute embolism and thrombosis of deep veins of left upper extremity: Secondary | ICD-10-CM | POA: Diagnosis not present

## 2021-03-08 DIAGNOSIS — I428 Other cardiomyopathies: Secondary | ICD-10-CM

## 2021-03-08 DIAGNOSIS — I5022 Chronic systolic (congestive) heart failure: Secondary | ICD-10-CM | POA: Diagnosis not present

## 2021-03-08 DIAGNOSIS — Z95810 Presence of automatic (implantable) cardiac defibrillator: Secondary | ICD-10-CM

## 2021-03-08 DIAGNOSIS — I4721 Torsades de pointes: Secondary | ICD-10-CM

## 2021-03-08 NOTE — Progress Notes (Signed)
Electrophysiology Office Follow up Visit Note:    Date:  03/08/2021   ID:  [name], DOB 04/14/1996, MRN [id number]  PCP:  Patient, [name]  [facility] Cardiologist:  Little Ishikawa, MD  [facility] Electrophysiologist:  Lanier Prude, MD    Interval History:    [name] is a 25 y.o. female who presents for a follow up visit. She has done well since leaving the hospital. She is with her mother today in clinic.  ICD pocket healing well. She was diagnosed with a DVT in the LUE and has started eliquis. She is tolerating the eliquis without bleeding.  She has seen Hilbert Corrigan, PsyD and is pleased to have several appointments coming up with him to continue discussions about her cardiomyopathy.  Her mother and her both think this will be productive.   Past Medical History:  Diagnosis Date  . Lactose intolerance   . NICM (nonischemic cardiomyopathy) (HCC)   . PVC's (premature ventricular contractions)   . Torsades de pointes [facility])     Past Surgical History:  Procedure Laterality Date  . ICD IMPLANT N/A 02/18/2021   Procedure: ICD IMPLANT;  Surgeon: Lanier Prude, MD;  Location: [facility] INVASIVE CV LAB;  Service: Cardiovascular;  Laterality: N/A;  . ICD IMPLANT Left 02/18/2021    Current Medications: Current Meds  Medication Sig  . [START ON 04/01/2021] apixaban (ELIQUIS) 5 MG TABS tablet Take 1 tablet (5 mg total) by mouth 2 (two) times daily.  . carvedilol (COREG) 6.25 MG tablet Take 1 tablet (6.25 mg total) by mouth in the morning and at bedtime.  . empagliflozin (JARDIANCE) 10 MG TABS tablet Take 10 mg by mouth daily.  Marland Kitchen ENTRESTO 24-26 MG Take 1 tablet by mouth 2 (two) times daily.  . potassium chloride SA (KLOR-CON) 20 MEQ tablet Take 1 tablet (20 mEq total) by mouth daily.  Marland Kitchen spironolactone (ALDACTONE) 25 MG tablet Take 0.5 tablets (12.5 mg total) by mouth at bedtime.  Samuel Jester 0.18/0.215/0.25 MG-35 MCG tablet  Take 1 tablet by mouth daily.  . valACYclovir (VALTREX) 1000 MG tablet Take 1,000 mg by mouth as needed for rash.  . [DISCONTINUED] APIXABAN (ELIQUIS) VTE STARTER PACK (10MG  AND 5MG ) Take as directed on package: start with two-5mg  tablets twice daily for 7 days. On day 8, switch to one-5mg  tablet twice daily.     Allergies:   Patient has no known allergies.   Social History   Socioeconomic History  . Marital status: Single    Spouse name: Not on file  . Number of children: Not on file  . Years of education: Not on file  . Highest education level: Not on file  Occupational History  . Not on file  Tobacco Use  . Smoking status: Never Smoker  . Smokeless tobacco: Never Used  Substance and Sexual Activity  . Alcohol use: No  . Drug use: No  . Sexual activity: Never  Other Topics Concern  . Not on file  Social History Narrative   [employer] high[employer]   10th grade.   volleyball   Social Determinants of Strain: Not on file  Food Insecurity: Not on file  Transportation Needs: Not on file  Physical Activity: Not on file  Stress: Not on file  Social Connections: Not on file     Family History: The patient's family history includes Hyperlipidemia in her maternal grandfather.  ROS:   Please see the history of  present illness.    All other systems reviewed and are negative.  EKGs/Labs/Other Studies Reviewed:    The following studies were reviewed today:  03/02/2021 US Venous Duplex Acute DVT of the LUE 65% atrially pacing.  Less than 1% ventricularly pacing  EKG:  The ekg ordered today demonstrates sinus rhythm at a rate of 92 bpm. Intervals are normal.  Recent Labs: 02/10/2021: B Natriuretic Peptide 44.4 02/16/2021: ALT 14; Magnesium 2.2 02/18/2021: BUN 13; Creatinine, Ser 0.81; Hemoglobin 13.6; Platelets 293; Potassium 3.7; Sodium 133  Recent Lipid Panel No results found for: CHOL, TRIG, HDL, CHOLHDL, VLDL, LDLCALC, LDLDIRECT  Physical  Exam:    VS:  BP 118/86   Pulse 92   Ht 5\' 6"  (1.676 m)   Wt 181 lb 3.2 oz (82.2 kg)   SpO2 99%   BMI 29.25 kg/m     Wt Readings from Last 3 Encounters:  03/08/21 181 lb 3.2 oz (82.2 kg)  03/02/21 182 lb 3.2 oz (82.6 kg)  02/18/21 178 lb 12.7 oz (81.1 kg)     GEN:  Well nourished, well developed in no acute distress HEENT: Normal NECK: No JVD; No carotid bruits LYMPHATICS: No lymphadenopathy CARDIAC: RRR, no murmurs, rubs, gallops. ICD pocket well healed in the LU chest. The LUE is mildly swollen compared to the RUE. There are venous collaterals seen throughout the left chest that stop at the midline. RESPIRATORY:  Clear to auscultation without rales, wheezing or rhonchi  ABDOMEN: Soft, non-tender, non-distended MUSCULOSKELETAL:  No deformity  SKIN: Warm and dry NEUROLOGIC:  Alert and oriented x 3 PSYCHIATRIC:  Normal affect   ASSESSMENT:    1. NICM (nonischemic cardiomyopathy) (HCC)   2. Torsades de pointes (HCC)   3. Chronic systolic heart failure (HCC)   4. Acute deep vein thrombosis (DVT) of left upper extremity, unspecified vein (HCC)   5. ICD (implantable cardioverter-defibrillator) in place    PLAN:    In order of problems listed above:  1. Nonischemic cardiomyopathy complicated by torsades de pointes post ICD Has done well after discharge from the hospital.  She has been recently diagnosed with an acute DVT of the left upper extremity and has started Eliquis.  Has been seen recently by Dr. 02/20/21.  Continue spironolactone, Entresto, Jardiance, Coreg.    2.  DVT On Eliquis  3.  ICD in situ Device interrogation today shows good battery longevity and stable lead parameters.  Lower rate limit at 80 bpm to help overdrive pace her torsades.  At this point, I favor keeping her lower rate at 80 bpm.  Follow up in person 1 year or sooner as needed.  Medication Adjustments/Labs and Tests Ordered: Current medicines are reviewed at length with the patient today.   Concerns regarding medicines are outlined above.  No orders of the defined types were placed in this encounter.  No orders of the defined types were placed in this encounter.    Signed, Gala Romney, MD, [facility], [facility] 03/08/2021 6:14 PM    Electrophysiology [facility]

## 2021-03-08 NOTE — Patient Instructions (Addendum)
Medication Instructions:  Your physician recommends that you continue on your current medications as directed. Please refer to the Current Medication list given to you today. *If you need a refill on your cardiac medications before your next appointment, please call your pharmacy*  Lab Work: None ordered. If you have labs (blood work) drawn today and your tests are completely normal, you will receive your results only by: Marland Kitchen MyChart Message (if you have MyChart) OR . A paper copy in the mail If you have any lab test that is abnormal or we need to change your treatment, we will call you to review the results.  Testing/Procedures: None ordered.  Follow-Up: At [facility], you and your health needs are our priority.  As part of our continuing mission to provide you with exceptional heart care, we have created designated Provider Care Teams.  These Care Teams include your primary Cardiologist (physician) and Advanced Practice Providers (APPs -  Physician Assistants and Nurse Practitioners) who all work together to provide you with the care you need, when you need it.  Your next appointment:   Your physician wants you to follow-up as scheduled  Remote monitoring is used to monitor your ICD from home. This monitoring reduces the number of office visits required to check your device to one time per year. It allows [place] to keep an eye on the functioning of your device to ensure it is working properly. You are scheduled for a device check from home on 05/23/2021. You may send your transmission at any time that day. If you have a wireless device, the transmission will be sent automatically. After your physician reviews your transmission, you will receive a postcard with your next transmission date.

## 2021-03-09 ENCOUNTER — Encounter (HOSPITAL_COMMUNITY): Payer: Self-pay

## 2021-03-14 ENCOUNTER — Telehealth (HOSPITAL_COMMUNITY): Payer: Self-pay

## 2021-03-15 ENCOUNTER — Ambulatory Visit: Payer: BC Managed Care – PPO | Admitting: Genetic Counselor

## 2021-03-15 ENCOUNTER — Other Ambulatory Visit: Payer: Self-pay

## 2021-03-15 ENCOUNTER — Telehealth: Payer: Self-pay

## 2021-03-15 DIAGNOSIS — I428 Other cardiomyopathies: Secondary | ICD-10-CM

## 2021-03-15 NOTE — Telephone Encounter (Signed)
-----  Message from Vickie Epley, MD sent at 03/15/2021  4:25 PM EDT ----- Regarding: RE: NICM test order I agree. Thanks for seeing her.  Sonia Baller, Can you please place the order for the gene testing? Thanks! Lysbeth Galas     ----- Message ----- From: Debbe Mounts, PhD Sent: 03/15/2021  11:11 AM EDT To: Vickie Epley, MD Subject: NICM test order                                Dr, Dorthy Cooler just met with [name] and am pretty intrigued by her presentation. I believe genetic testing for NICM is warranted. Can you please place an order for NICM in Epic?  Thanks [facility]

## 2021-03-15 NOTE — Telephone Encounter (Signed)
[id number] and [id number] ordered for genetic NICM work up

## 2021-03-16 ENCOUNTER — Ambulatory Visit (INDEPENDENT_AMBULATORY_CARE_PROVIDER_SITE_OTHER): Payer: BC Managed Care – PPO | Admitting: Psychologist

## 2021-03-16 DIAGNOSIS — F4521 Hypochondriasis: Secondary | ICD-10-CM

## 2021-03-16 DIAGNOSIS — F320 Major depressive disorder, single episode, mild: Secondary | ICD-10-CM

## 2021-03-16 NOTE — Progress Notes (Signed)
Pre Test GC  Referring Provider: Steffanie Dunn, MD   Referral Reason  bianney rockwood was referred for genetic consult and testing for Non-ischemic cardiomyopathy (NICM) subsequent to cardiac imaging studies that detected low EF of 10% by cardiac MRI.  Personal Medical Information [name] (III.1 on pedigree) is a very pleasant 25 year-old Caucasian woman who is completing her Masters in [employer] from [employer]. 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 She tells me that that she used to lead a very active lifestyle- playing volleyball thrice a week at college and used to coach the volleyball team at [employer].   She reports noticing symptoms of mild dyspnea and lethargy in September 2021 that rapidly worsened. She began having increasing periods of lightheadedness and dizziness that lasted from a few minutes to half a day. She went to Urgent Care at [facility] and was found to have an abnormal EKG. She was sent to the ER for a prolonged QT interval, likely due to her Zoloft which was promptly discontinued. She was referred to a cardiologist and underwent a stress echo that detected dilated cardiomyopathy with an EF of 10-15%. She was told of having heart failure, was prescribed Entresto and carvedilol and asked to return in 3 months. However, her symptoms worsened soon after and she was admitted in the cardiac ICU. She reports having several visits to the ICU and did not feel that she was being treated appropriately. She returned home to [facility] and was seen by [name] who later implanted an ICD. She now reports feeling much better though did have DVT after her ICD surgery.   Traditional Risk Factors Ivis denies knowing of having other cardiac or systemic conditions that can cause non-ischemic cardiomyopathy, namely myocarditis, ischemic heart disease, infiltrative myocardial disease (amyloidosis, sarcoidosis, hemochromatosis), hypertension,  infection with HIV virus, connective tissue disease (such as systemic lupus erythematosus etc.). She also denies substance abuse (chronic alcohol abuse, cocaine abuse), doxorubicin therapy and of having other cardiovascular diseases (valvular heart disease, HCM).    Family history Briceyda (III.1) has two younger sisters, ages 91 and 31 (III.2, III.3) who are in good health and have not yet undergone cardiac screening for cardiomyopathy. Father (II.1), age 71 has hypertension, hyperlipidemia but no adverse events. Her paternal grandmother (I.2) had a heart attack while at the doctor's office. She was taken to the ER and was told she would need a bypass surgery. She died prior to her surgery. Her mother (II.2) is now 35 and in good health. There is no history of heart disease in her two siblings (II.3-II.4) and her parents (I.1, I.2) who are now 14 years old. Her maternal grandfather has pneumonia and is currently hospitalized after contracting COVID19  Pre-test Genetic Consult notes  I reviewed the different genetic cardiomyopathies that are considered non-ischemic cardiomyopathy, namely ARVC (now called ACM), DCM, HCM and LVNC. I discussed the genetics of HCM, DCM, LVNC and ACM, namely inheritance, incomplete penetrance, variable expression and digenic/compound mutations that can be seen in some patients. We walked through the process of genetic testing. I explained to her that there are three possible outcomes of genetic testing; namely positive, negative and finding a variant of unknown significance. A positive outcome can be expected in cases that do not have risk factors for a cardiomyopathy, present early in life with increased severity and have a family history of sudden cardiac death and/or a relative that has been diagnosed with cardiomyopathy. Limitations in current  genetic testing methodology can produce a negative result. Variants of unknown significance (VUS) are also observed. I explained to  her that typically a VUS is so classified if the variant is not well understood as very few individuals have been reported to harbor this variant or its role in gene function has not been elucidated. The potential outcomes of genetic testing and subsequent management of at-risk family members were discussed so as to manage expectations.   Impression  In summary, [name]'s early age and severity of presentation in the absence of risk factors is indicative of a genetic condition, However, there is no family history of heart disease in her first-degree relatives which could be  due to age-related penetrance. As this is an autosomal dominant condition, first-degree relatives are at a 50% risk of inheriting NICM. Genetic testing for genes implicated in nonischemic cardiomyopathy is highly recommended. The test will determine the underlying genetic basis of her condition and stratify risk of NICM in his family.   In addition, we discussed the protections afforded by the Genetic Information Non-Discrimination Act (GINA). I explained to her that GINA protects her from losing her employment or health insurance based on her genotype. However, these protections do not cover life insurance and disability. She verbalized understanding of this and is unsure if her sisters have life insurance.  Please note that the patient has not been counseled in this visit on personal, cultural or ethical issues that she may face due to her heart condition.   Plan After a thorough discussion of the risk and benefits of genetic testing for NICM, [name] states her intent to pursue genetic testing for NICM and signed the informed consent form. Blood was drawn today for testing.   Sidney Ace, Ph.D, [facility] Clinical Molecular Geneticist

## 2021-03-18 ENCOUNTER — Telehealth: Payer: Self-pay | Admitting: Cardiology

## 2021-03-18 NOTE — Telephone Encounter (Signed)
Returned call to [place]. Per Inetta Fermo she just saw the hospital note indicating Pt with Torsades.  She states she does not need any additional information.

## 2021-03-18 NOTE — Telephone Encounter (Signed)
Inetta Fermo from [facility] calling in regards to a prior auth for genetic testing. She states she needs clinical information on the patient and needs to know if the member has a cardiac conduction disease or disorder. Reference number: [id number]

## 2021-03-21 ENCOUNTER — Telehealth (HOSPITAL_COMMUNITY): Payer: Self-pay

## 2021-03-21 ENCOUNTER — Other Ambulatory Visit: Payer: Self-pay | Admitting: *Deleted

## 2021-03-21 DIAGNOSIS — I5022 Chronic systolic (congestive) heart failure: Secondary | ICD-10-CM

## 2021-03-21 DIAGNOSIS — I428 Other cardiomyopathies: Secondary | ICD-10-CM

## 2021-03-21 NOTE — Telephone Encounter (Signed)
Returned pt phone call, advised pt that we have a backlog right now and that we will call her back to schedule as soon as we get to her ppw, spoke with pt mom and advised her that we can place her on the cancellation list.

## 2021-03-23 ENCOUNTER — Ambulatory Visit (INDEPENDENT_AMBULATORY_CARE_PROVIDER_SITE_OTHER): Payer: BC Managed Care – PPO | Admitting: Psychologist

## 2021-03-23 DIAGNOSIS — F4521 Hypochondriasis: Secondary | ICD-10-CM | POA: Diagnosis not present

## 2021-03-23 DIAGNOSIS — F320 Major depressive disorder, single episode, mild: Secondary | ICD-10-CM

## 2021-03-28 ENCOUNTER — Other Ambulatory Visit: Payer: Self-pay

## 2021-03-28 ENCOUNTER — Encounter: Payer: BC Managed Care – PPO | Attending: Internal Medicine | Admitting: *Deleted

## 2021-03-28 DIAGNOSIS — I428 Other cardiomyopathies: Secondary | ICD-10-CM | POA: Insufficient documentation

## 2021-03-28 DIAGNOSIS — I5022 Chronic systolic (congestive) heart failure: Secondary | ICD-10-CM

## 2021-03-28 NOTE — Progress Notes (Signed)
Virtual orientation call completed today. shehas an appointment on Date: 03/30/2021 for EP eval and gym Orientation.  Documentation of diagnosis can be found in [facility] Date: 02/27/2021.

## 2021-03-30 ENCOUNTER — Ambulatory Visit (INDEPENDENT_AMBULATORY_CARE_PROVIDER_SITE_OTHER): Payer: BC Managed Care – PPO | Admitting: Psychologist

## 2021-03-30 ENCOUNTER — Encounter: Payer: BC Managed Care – PPO | Admitting: *Deleted

## 2021-03-30 ENCOUNTER — Other Ambulatory Visit: Payer: Self-pay

## 2021-03-30 VITALS — Ht 67.5 in | Wt 180.3 lb

## 2021-03-30 DIAGNOSIS — I428 Other cardiomyopathies: Secondary | ICD-10-CM | POA: Diagnosis present

## 2021-03-30 DIAGNOSIS — F4521 Hypochondriasis: Secondary | ICD-10-CM | POA: Diagnosis not present

## 2021-03-30 DIAGNOSIS — F320 Major depressive disorder, single episode, mild: Secondary | ICD-10-CM | POA: Diagnosis not present

## 2021-03-30 DIAGNOSIS — I5022 Chronic systolic (congestive) heart failure: Secondary | ICD-10-CM

## 2021-03-30 NOTE — Patient Instructions (Signed)
Patient Instructions  Patient Details  Name: [name] MRN: [id number] Date of Birth: [birth year] Referring Provider:  Dolores Patty, MD  Below are your personal goals for exercise, nutrition, and risk factors. Our goal is to help you stay on track towards obtaining and maintaining these goals. We will be discussing your progress on these goals with you throughout the program.  Initial Exercise Prescription:  Initial Exercise Prescription - 03/30/21 1300      Date of Initial Exercise RX and Referring Provider   Date 03/30/21    Referring Provider Arvilla Meres MD      Treadmill   MPH 2.2    Grade 1    Minutes 15    METs 2.99      Recumbant Bike   Level 3    RPM 50    Watts 50    Minutes 15    METs 3      Recumbant Elliptical   Level 2    RPM 50    Minutes 15    METs 3      Elliptical   Level 1    Speed 4    Minutes 15    METs 3      REL-XR   Level 3    Speed 50    Minutes 15    METs 3      Prescription Details   Frequency (times per week) 3    Duration Progress to 30 minutes of continuous aerobic without signs/symptoms of physical distress      Intensity   THRR 40-80% of Max Heartrate 126-173    Ratings of Perceived Exertion 11-13    Perceived Dyspnea 0-4      Progression   Progression Continue to progress workloads to maintain intensity without signs/symptoms of physical distress.      Resistance Training   Training Prescription Yes    Weight 4 lb    Reps 10-15           Exercise Goals: Frequency: Be able to perform aerobic exercise two to three times per week in program working toward 2-5 days per week of home exercise.  Intensity: Work with a perceived exertion of 11 (fairly light) - 15 (hard) while following your exercise prescription.  We will make changes to your prescription with you as you progress through the program.   Duration: Be able to do 30 to 45 minutes of continuous aerobic exercise in addition to a 5 minute  warm-up and a 5 minute cool-down routine.   Nutrition Goals: Your personal nutrition goals will be established when you do your nutrition analysis with the dietician.  The following are general nutrition guidelines to follow: Cholesterol < 200mg /day Sodium < 1500mg /day Fiber: Women under 50 yrs - 25 grams per day  Personal Goals:  Personal Goals and Risk Factors at Admission - 03/30/21 1357      Core Components/Risk Factors/Patient Goals on Admission    Weight Management Yes;Weight Loss    Intervention Weight Management: Develop a combined nutrition and exercise program designed to reach desired caloric intake, while maintaining appropriate intake of nutrient and fiber, sodium and fats, and appropriate energy expenditure required for the weight goal.;Weight Management: Provide education and appropriate resources to help participant work on and attain dietary goals.;Weight Management/Obesity: Establish reasonable short term and long term weight goals.    Admit Weight 180 lb 4.8 oz (81.8 kg)    Goal Weight: Short Term 175 lb (79.4 kg)  Goal Weight: Long Term 175 lb (79.4 kg)    Expected Outcomes Short Term: Continue to assess and modify interventions until short term weight is achieved;Long Term: Adherence to nutrition and physical activity/exercise program aimed toward attainment of established weight goal;Weight Loss: Understanding of general recommendations for a balanced deficit meal plan, which promotes 1-2 lb weight loss per week and includes a negative energy balance of [phone] kcal/d;Understanding recommendations for meals to include 15-35% energy as protein, 25-35% energy from fat, 35-60% energy from carbohydrates, less than 200mg  of dietary cholesterol, 20-35 gm of total fiber daily;Understanding of distribution of calorie intake throughout the day with the consumption of 4-5 meals/snacks    Heart Failure Yes    Intervention Provide a combined exercise and nutrition program that is  supplemented with education, support and counseling about heart failure. Directed toward relieving symptoms such as shortness of breath, decreased exercise tolerance, and extremity edema.    Expected Outcomes Improve functional capacity of life;Short term: Attendance in program 2-3 days a week with increased exercise capacity. Reported lower sodium intake. Reported increased fruit and vegetable intake. Reports medication compliance.;Short term: Daily weights obtained and reported for increase. Utilizing diuretic protocols set by physician.;Long term: Adoption of self-care skills and reduction of barriers for early signs and symptoms recognition and intervention leading to self-care maintenance.           Tobacco Use Initial Evaluation: Social History   Tobacco Use  Smoking Status Never Smoker  Smokeless Tobacco Never Used    Exercise Goals and Review:  Exercise Goals    Row Name 03/30/21 1355             Exercise Goals   Increase Physical Activity Yes       Intervention Provide advice, education, support and counseling about physical activity/exercise needs.;Develop an individualized exercise prescription for aerobic and resistive training based on initial evaluation findings, risk stratification, comorbidities and participant's personal goals.       Expected Outcomes Short Term: Attend rehab on a regular basis to increase amount of physical activity.;Long Term: Add in home exercise to make exercise part of routine and to increase amount of physical activity.;Long Term: Exercising regularly at least 3-5 days a week.       Increase Strength and Stamina Yes       Intervention Provide advice, education, support and counseling about physical activity/exercise needs.;Develop an individualized exercise prescription for aerobic and resistive training based on initial evaluation findings, risk stratification, comorbidities and participant's personal goals.       Expected Outcomes Short Term:  Increase workloads from initial exercise prescription for resistance, speed, and METs.;Short Term: Perform resistance training exercises routinely during rehab and add in resistance training at home;Long Term: Improve cardiorespiratory fitness, muscular endurance and strength as measured by increased METs and functional capacity (04/01/21)       Able to understand and use rate of perceived exertion (RPE) scale Yes       Intervention Provide education and explanation on how to use RPE scale       Expected Outcomes Short Term: Able to use RPE daily in rehab to express subjective intensity level;Long Term:  Able to use RPE to guide intensity level when exercising independently       Able to understand and use Dyspnea scale Yes       Intervention Provide education and explanation on how to use Dyspnea scale       Expected Outcomes Short Term: Able to use Dyspnea scale  daily in rehab to express subjective sense of shortness of breath during exertion;Long Term: Able to use Dyspnea scale to guide intensity level when exercising independently       Knowledge and understanding of Target Heart Rate Range (THRR) Yes       Intervention Provide education and explanation of THRR including how the numbers were predicted and where they are located for reference       Expected Outcomes Short Term: Able to state/look up THRR;Long Term: Able to use THRR to govern intensity when exercising independently;Short Term: Able to use daily as guideline for intensity in rehab       Able to check pulse independently Yes       Intervention Provide education and demonstration on how to check pulse in carotid and radial arteries.;Review the importance of being able to check your own pulse for safety during independent exercise       Expected Outcomes Short Term: Able to explain why pulse checking is important during independent exercise;Long Term: Able to check pulse independently and accurately       Understanding of Exercise  Prescription Yes       Intervention Provide education, explanation, and written materials on patient's individual exercise prescription       Expected Outcomes Short Term: Able to explain program exercise prescription;Long Term: Able to explain home exercise prescription to exercise independently              Copy of goals given to participant.

## 2021-03-30 NOTE — Progress Notes (Signed)
Cardiac Individual Treatment Plan  Patient Details  Name: [name] MRN: [id number] Date of Birth: 1996/03/20 Referring Provider:   Flowsheet Row Cardiac Rehab [facility] 03/30/2021 [facility]  Referring Provider Arvilla MeresBensimhon, Daniel MD      Initial Encounter Date:  Flowsheet Row [facility]  Date 03/30/21      Visit Diagnosis: Heart failure, chronic systolic (HCC)  Patient's Home Medications on Admission:  Current Outpatient Medications:  .  [START ON 04/01/2021] apixaban (ELIQUIS) 5 MG TABS tablet, Take 1 tablet (5 mg total) by mouth 2 (two) times daily., Disp: 60 tablet, Rfl: 6 .  carvedilol (COREG) 6.25 MG tablet, Take 1 tablet (6.25 mg total) by mouth in the morning and at bedtime., Disp: 60 tablet, Rfl: 11 .  empagliflozin (JARDIANCE) 10 MG TABS tablet, Take 10 mg by mouth daily., Disp: , Rfl:  .  ENTRESTO 24-26 MG, Take 1 tablet by mouth 2 (two) times daily., Disp: , Rfl:  .  potassium chloride SA (KLOR-CON) 20 MEQ tablet, Take 1 tablet (20 mEq total) by mouth daily., Disp: 30 tablet, Rfl: 3 .  spironolactone (ALDACTONE) 25 MG tablet, Take 0.5 tablets (12.5 mg total) by mouth at bedtime., Disp: 45 tablet, Rfl: 3 .  TRI-ESTARYLLA 0.18/0.215/0.25 MG-35 MCG tablet, Take 1 tablet by mouth daily., Disp: , Rfl:  .  valACYclovir (VALTREX) 1000 MG tablet, Take 1,000 mg by mouth as needed for rash., Disp: , Rfl:   Past Medical History: Past Medical History:  Diagnosis Date  . Lactose intolerance   . NICM (nonischemic cardiomyopathy) (HCC)   . PVC's (premature ventricular contractions)   . Torsades de pointes (HCC)     Tobacco Use: Social History   Tobacco Use  Smoking Status Never Smoker  Smokeless Tobacco Never Used    Labs: Recent Review Flowsheet Data   There is no flowsheet data to display.      Exercise Target Goals: Exercise Program Goal: Individual exercise prescription set using  results from initial 6 min walk test and THRR while considering  patient's activity barriers and safety.   Exercise Prescription Goal: Initial exercise prescription builds to 30-45 minutes a day of aerobic activity, 2-3 days per week.  Home exercise guidelines will be given to patient during program as part of exercise prescription that the participant will acknowledge.   Education: Aerobic Exercise: - Group verbal and visual presentation on the components of exercise prescription. Introduces F.I.T.T principle from ACSM for exercise prescriptions.  Reviews F.I.T.T. principles of aerobic exercise including progression. Written material given at graduation.   Education: Resistance Exercise: - Group verbal and visual presentation on the components of exercise prescription. Introduces F.I.T.T principle from ACSM for exercise prescriptions  Reviews F.I.T.T. principles of resistance exercise including progression. Written material given at graduation.    Education: Exercise & Equipment Safety: - Individual verbal instruction and demonstration of equipment use and safety with use of the equipment. Flowsheet Row Cardiac Rehab from [facility]30/2021 [facility]  Date 03/30/21  [facility]  Instruction Review Code 1- Verbalizes Understanding      Education: Exercise Physiology & General Exercise Guidelines: - Group verbal and written instruction with models to review the exercise physiology of the cardiovascular system and associated critical values. Provides general exercise guidelines with specific guidelines to those with heart or lung disease.    Education: Flexibility, Balance, Mind/Body Relaxation: - Group verbal and visual presentation with interactive activity  on the components of exercise prescription. Introduces F.I.T.T principle from ACSM for exercise prescriptions. Reviews F.I.T.T. principles of flexibility and balance exercise training including progression. Also  discusses the mind body connection.  Reviews various relaxation techniques to help reduce and manage stress (i.e. Deep breathing, progressive muscle relaxation, and visualization). Balance handout provided to take home. Written material given at graduation.   Activity Barriers & Risk Stratification:  Activity Barriers & Cardiac Risk Stratification - 03/30/21 1352      Activity Barriers & Cardiac Risk Stratification   Activity Barriers Shortness of Breath;Deconditioning    Cardiac Risk Stratification High           6 Minute Walk:  6 Minute Walk    Row Name 03/30/21 1351         6 Minute Walk   Phase Initial     Distance 1175 feet     Walk Time 6 minutes     # of Rest Breaks 0     MPH 2.22     METS 5.31     RPE 12     Perceived Dyspnea  1     VO2 Peak 18.57     Symptoms Yes (comment)     Comments SOB     Resting HR 79 bpm     Resting BP 104/62     Resting Oxygen Saturation  96 %     Exercise Oxygen Saturation  during 6 min walk 97 %     Max Ex. HR 110 bpm     Max Ex. BP 128/72     2 Minute Post BP 108/60            Oxygen Initial Assessment:   Oxygen Re-Evaluation:   Oxygen Discharge (Final Oxygen Re-Evaluation):   Initial Exercise Prescription:  Initial Exercise Prescription - 03/30/21 1300      Date of Initial Exercise RX and Referring Provider   Date 03/30/21    Referring Provider Arvilla Meres MD      Treadmill   MPH 2.2    Grade 1    Minutes 15    METs 2.99      Recumbant Bike   Level 3    RPM 50    Watts 50    Minutes 15    METs 3      Recumbant Elliptical   Level 2    RPM 50    Minutes 15    METs 3      Elliptical   Level 1    Speed 4    Minutes 15    METs 3      REL-XR   Level 3    Speed 50    Minutes 15    METs 3      Prescription Details   Frequency (times per week) 3    Duration Progress to 30 minutes of continuous aerobic without signs/symptoms of physical distress      Intensity   THRR 40-80% of Max  Heartrate 126-173    Ratings of Perceived Exertion 11-13    Perceived Dyspnea 0-4      Progression   Progression Continue to progress workloads to maintain intensity without signs/symptoms of physical distress.      Resistance Training   Training Prescription Yes    Weight 4 lb    Reps 10-15           Perform Capillary Blood Glucose checks as needed.  Exercise Prescription Changes:  Exercise Prescription Changes  Row Name 03/30/21 1300             Response to Exercise   Blood Pressure (Admit) 104/62       Blood Pressure (Exercise) 128/72       Blood Pressure (Exit) 108/60       Heart Rate (Admit) 79 bpm       Heart Rate (Exercise) 110 bpm       Heart Rate (Exit) 87 bpm       Oxygen Saturation (Admit) 96 %       Oxygen Saturation (Exercise) 97 %       Rating of Perceived Exertion (Exercise) 12       Perceived Dyspnea (Exercise) 1       Symptoms SOB       Comments walk test              Exercise Comments:   Exercise Goals and Review:  Exercise Goals    Row Name 03/30/21 1355             Exercise Goals   Increase Physical Activity Yes       Intervention Provide advice, education, support and counseling about physical activity/exercise needs.;Develop an individualized exercise prescription for aerobic and resistive training based on initial evaluation findings, risk stratification, comorbidities and participant's personal goals.       Expected Outcomes Short Term: Attend rehab on a regular basis to increase amount of physical activity.;Long Term: Add in home exercise to make exercise part of routine and to increase amount of physical activity.;Long Term: Exercising regularly at least 3-5 days a week.       Increase Strength and Stamina Yes       Intervention Provide advice, education, support and counseling about physical activity/exercise needs.;Develop an individualized exercise prescription for aerobic and resistive training based on initial evaluation  findings, risk stratification, comorbidities and participant's personal goals.       Expected Outcomes Short Term: Increase workloads from initial exercise prescription for resistance, speed, and METs.;Short Term: Perform resistance training exercises routinely during rehab and add in resistance training at home;Long Term: Improve cardiorespiratory fitness, muscular endurance and strength as measured by increased METs and functional capacity ( )       Able to understand and use rate of perceived exertion (RPE) scale Yes       Intervention Provide education and explanation on how to use RPE scale       Expected Outcomes Short Term: Able to use RPE daily in rehab to express subjective intensity level;Long Term:  Able to use RPE to guide intensity level when exercising independently       Able to understand and use Dyspnea scale Yes       Intervention Provide education and explanation on how to use Dyspnea scale       Expected Outcomes Short Term: Able to use Dyspnea scale daily in rehab to express subjective sense of shortness of breath during exertion;Long Term: Able to use Dyspnea scale to guide intensity level when exercising independently       Knowledge and understanding of Target Heart Rate Range (THRR) Yes       Intervention Provide education and explanation of THRR including how the numbers were predicted and where they are located for reference       Expected Outcomes Short Term: Able to state/look up THRR;Long Term: Able to use THRR to govern intensity when exercising independently;Short Term: Able to use daily as guideline for intensity in  rehab       Able to check pulse independently Yes       Intervention Provide education and demonstration on how to check pulse in carotid and radial arteries.;Review the importance of being able to check your own pulse for safety during independent exercise       Expected Outcomes Short Term: Able to explain why pulse checking is important during  independent exercise;Long Term: Able to check pulse independently and accurately       Understanding of Exercise Prescription Yes       Intervention Provide education, explanation, and written materials on patient's individual exercise prescription       Expected Outcomes Short Term: Able to explain program exercise prescription;Long Term: Able to explain home exercise prescription to exercise independently              Exercise Goals Re-Evaluation :   Discharge Exercise Prescription (Final Exercise Prescription Changes):  Exercise Prescription Changes - 03/30/21 1300      Response to Exercise   Blood Pressure (Admit) 104/62    Blood Pressure (Exercise) 128/72    Blood Pressure (Exit) 108/60    Heart Rate (Admit) 79 bpm    Heart Rate (Exercise) 110 bpm    Heart Rate (Exit) 87 bpm    Oxygen Saturation (Admit) 96 %    Oxygen Saturation (Exercise) 97 %    Rating of Perceived Exertion (Exercise) 12    Perceived Dyspnea (Exercise) 1    Symptoms SOB    Comments walk test           Nutrition:  Target Goals: Understanding of nutrition guidelines, daily intake of sodium 1500mg , cholesterol 200mg , calories 30% from fat and 7% or less from saturated fats, daily to have 5 or more servings of fruits and vegetables.  Education: All About Nutrition: -Group instruction provided by verbal, written material, interactive activities, discussions, models, and posters to present general guidelines for heart healthy nutrition including fat, fiber, MyPlate, the role of sodium in heart healthy nutrition, utilization of the nutrition label, and utilization of this knowledge for meal planning. Follow up email sent as well. Written material given at graduation.   Biometrics:  Pre Biometrics - 03/30/21 1355      Pre Biometrics   Height 5' 7.5" (1.715 m)    Weight 180 lb 4.8 oz (81.8 kg)    BMI (Calculated) 27.81    Single Leg Stand 30 seconds            Nutrition Therapy Plan and Nutrition  Goals:  Nutrition Therapy & Goals - 03/30/21 1357      Intervention Plan   Intervention Prescribe, educate and counsel regarding individualized specific dietary modifications aiming towards targeted core components such as weight, hypertension, lipid management, diabetes, heart failure and other comorbidities.    Expected Outcomes Short Term Goal: Understand basic principles of dietary content, such as calories, fat, sodium, cholesterol and nutrients.;Short Term Goal: A plan has been developed with personal nutrition goals set during dietitian appointment.;Long Term Goal: Adherence to prescribed nutrition plan.           Nutrition Assessments:  MEDIFICTS Score Key:  ?70 Need to make dietary changes   40-70 Heart Healthy Diet  ? 40 Therapeutic Level Cholesterol Diet  Flowsheet Row Cardiac Rehab from 03/30/2021 in [facility] Cardiac and Pulmonary Rehab  Picture Your Plate Total Score on Admission 78     Picture Your Plate Scores:  <36 Unhealthy dietary pattern with much room for improvement.  41-50 Dietary pattern unlikely to meet recommendations for good health and room for improvement.  51-60 More healthful dietary pattern, with some room for improvement.   >60 Healthy dietary pattern, although there may be some specific behaviors that could be improved.    Nutrition Goals Re-Evaluation:   Nutrition Goals Discharge (Final Nutrition Goals Re-Evaluation):   Psychosocial: Target Goals: Acknowledge presence or absence of significant depression and/or stress, maximize coping skills, provide positive support system. Participant is able to verbalize types and ability to use techniques and skills needed for reducing stress and depression.   Education: Stress, Anxiety, and Depression - Group verbal and visual presentation to define topics covered.  Reviews how body is impacted by stress, anxiety, and depression.  Also discusses healthy ways to reduce stress and to treat/manage anxiety  and depression.  Written material given at graduation.   Education: Sleep Hygiene -Provides group verbal and written instruction about how sleep can affect your health.  Define sleep hygiene, discuss sleep cycles and impact of sleep habits. Review good sleep hygiene tips.    Initial Review & Psychosocial Screening:  Initial Psych Review & Screening - 03/28/21 1126      Initial Review   Current issues with Current Stress Concerns   History of generalized Anxiety   Source of Stress Concerns Unable to participate in former interests or hobbies    Comments Home from school in [place]- Masters program is virtual . 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      Family Dynamics   Good Support System? Yes   DIrect family.  Mom and Dad and younger sister , boyfrient, best frient in [place], sister not living at home     Barriers   Psychosocial barriers to participate in program There are no identifiable barriers or psychosocial needs.;The patient should benefit from training in stress management and relaxation.      Screening Interventions   Interventions Encouraged to exercise    Expected Outcomes Short Term goal: Utilizing psychosocial counselor, staff and physician to assist with identification of specific Stressors or current issues interfering with healing process. Setting desired goal for each stressor or current issue identified.;Long Term Goal: Stressors or current issues are controlled or eliminated.;Short Term goal: Identification and review with participant of any Quality of Life or Depression concerns found by scoring the questionnaire.;Long Term goal: The participant improves quality of Life and PHQ9 Scores as seen by post scores and/or verbalization of changes           Quality of Life Scores:   Quality of Life - 03/30/21 1356      Quality of Life   Select Quality of Life      Quality of Life Scores   Health/Function Pre 12.13 %    Socioeconomic Pre 25.56 %     Psych/Spiritual Pre 18.71 %    Family Pre 19.8 %    GLOBAL Pre 17.61 %          Scores of 19 and below usually indicate a poorer quality of life in these areas.  A difference of  2-3 points is a clinically meaningful difference.  A difference of 2-3 points in the total score of the Quality of Life Index has been associated with significant improvement in overall quality of life, self-image, physical symptoms, and general health in studies assessing change in quality of life.  PHQ-9: Recent Review Flowsheet Data    Depression screen [facility] 2/9 03/30/2021   Decreased Interest 1   Down,  Depressed, Hopeless 1   PHQ - 2 Score 2   Altered sleeping 0   Tired, decreased energy 1   Change in appetite 1   Feeling bad or failure about yourself  0   Trouble concentrating 0   Moving slowly or fidgety/restless 0   Suicidal thoughts 0   PHQ-9 Score 4   Difficult doing work/chores Somewhat difficult     Interpretation of Total Score  Total Score Depression Severity:  1-4 = Minimal depression, 5-9 = Mild depression, 10-14 = Moderate depression, 15-19 = Moderately severe depression, 20-27 = Severe depression   Psychosocial Evaluation and Intervention:  Psychosocial Evaluation - 03/28/21 1144      Psychosocial Evaluation & Interventions   Interventions Encouraged to exercise with the program and follow exercise prescription    Comments Tammera has no barriers to attending the program. [facility] lives with her parents and younger suister at the [employer]. She has come home for her heart failure treatment and will be in [place] for awhile. Her support system is good:  Besides her family at home she has a boyfrientd in [place],where she goes to school. She has a best friend and her other sister. 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 She is seeing a counselor regarding all the health issues  and how it is effecting her at the moment. She is not on any meds for depression nor anxiety. She has a history of some generalized anxiety and was taken off the meds she was taking as it may have effected her heart rhythm. She is doing well without the meds at this moment. She is ready to get started and wants to learn all she can to become as healthy as she can to get back to her routine and previous life activities. She should do well in the program    Expected Outcomes STG She attends all scheduled sessions, she continues to see her counselor as needed. LTG: She is able to continue to progress to return to her previous level of activities.    Continue Psychosocial Services  Follow up required by staff           Psychosocial Re-Evaluation:   Psychosocial Discharge (Final Psychosocial Re-Evaluation):   Vocational Rehabilitation: Provide vocational rehab assistance to qualifying candidates.   Vocational Rehab Evaluation & Intervention:  Vocational Rehab - 03/28/21 1136      Initial Vocational Rehab Evaluation & Intervention   Assessment shows need for Vocational Rehabilitation No           Education: Education Goals: Education classes will be provided on a variety of topics geared toward better understanding of heart health and risk factor modification. Participant will state understanding/return demonstration of topics presented as noted by education test scores.  Learning Barriers/Preferences:  Learning Barriers/Preferences - 03/28/21 1135      Learning Barriers/Preferences   Learning Barriers None    Learning Preferences None           General Cardiac Education Topics:  AED/CPR: - Group verbal and written instruction with the use of models to demonstrate the basic use of the AED with the basic ABC's of resuscitation.   Anatomy and Cardiac Procedures: - Group verbal and visual presentation and models provide information about basic cardiac anatomy and function.  Reviews the testing methods done to diagnose heart disease and the outcomes of the test  results. Describes the treatment choices: Medical Management, Angioplasty, or Coronary Bypass Surgery for treating various heart conditions including Myocardial Infarction, Angina, Valve Disease, and Cardiac Arrhythmias.  Written material given at graduation.   Medication Safety: - Group verbal and visual instruction to review commonly prescribed medications for heart and lung disease. Reviews the medication, class of the drug, and side effects. Includes the steps to properly store meds and maintain the prescription regimen.  Written material given at graduation.   Intimacy: - Group verbal instruction through game format to discuss how heart and lung disease can affect sexual intimacy. Written material given at graduation..   Know Your Numbers and Heart Failure: - Group verbal and visual instruction to discuss disease risk factors for cardiac and pulmonary disease and treatment options.  Reviews associated critical values for Overweight/Obesity, Hypertension, Cholesterol, and Diabetes.  Discusses basics of heart failure: signs/symptoms and treatments.  Introduces Heart Failure Zone chart for action plan for heart failure.  Written material given at graduation.   Infection Prevention: - Provides verbal and written material to individual with discussion of infection control including proper hand washing and proper equipment cleaning during exercise session. Flowsheet Row Cardiac Rehab from 03/30/2021 in [facility] Cardiac and Pulmonary Rehab  Date 03/30/21  Educator [facility]  Instruction Review Code 1- Verbalizes Understanding      Falls Prevention: - Provides verbal and written material to individual with discussion of falls prevention and safety. Flowsheet Row Cardiac Rehab from 03/30/2021 in [facility] Cardiac and Pulmonary Rehab  Date 03/28/21  Educator [facility]  Instruction Review Code 1- Verbalizes Understanding       Other: -Provides group and verbal instruction on various topics (see comments)   Knowledge Questionnaire Score:  Knowledge Questionnaire Score - 03/30/21 1357      Knowledge Questionnaire Score   Pre Score 26/26           Core Components/Risk Factors/Patient Goals at Admission:  Personal Goals and Risk Factors at Admission - 03/30/21 1357      Core Components/Risk Factors/Patient Goals on Admission    Weight Management Yes;Weight Loss    Intervention Weight Management: Develop a combined nutrition and exercise program designed to reach desired caloric intake, while maintaining appropriate intake of nutrient and fiber, sodium and fats, and appropriate energy expenditure required for the weight goal.;Weight Management: Provide education and appropriate resources to help participant work on and attain dietary goals.;Weight Management/Obesity: Establish reasonable short term and long term weight goals.    Admit Weight 180 lb 4.8 oz (81.8 kg)    Goal Weight: Short Term 175 lb (79.4 kg)    Goal Weight: Long Term 175 lb (79.4 kg)    Expected Outcomes Short Term: Continue to assess and modify interventions until short term weight is achieved;Long Term: Adherence to nutrition and physical activity/exercise program aimed toward attainment of established weight goal;Weight Loss: Understanding of general recommendations for a balanced deficit meal plan, which promotes 1-2 lb weight loss per week and includes a negative energy balance of [phone] kcal/d;Understanding recommendations for meals to include 15-35% energy as protein, 25-35% energy from fat, 35-60% energy from carbohydrates, less than 200mg  of dietary cholesterol, 20-35 gm of total fiber daily;Understanding of distribution of calorie intake throughout the day with the consumption of 4-5 meals/snacks    Heart Failure Yes    Intervention Provide a combined exercise and nutrition program that is supplemented with education, support and  counseling about heart failure. Directed toward relieving symptoms such as shortness of breath, decreased exercise  tolerance, and extremity edema.    Expected Outcomes Improve functional capacity of life;Short term: Attendance in program 2-3 days a week with increased exercise capacity. Reported lower sodium intake. Reported increased fruit and vegetable intake. Reports medication compliance.;Short term: Daily weights obtained and reported for increase. Utilizing diuretic protocols set by physician.;Long term: Adoption of self-care skills and reduction of barriers for early signs and symptoms recognition and intervention leading to self-care maintenance.           Education:Diabetes - Individual verbal and written instruction to review signs/symptoms of diabetes, desired ranges of glucose level fasting, after meals and with exercise. Acknowledge that pre and post exercise glucose checks will be done for 3 sessions at entry of program.   Core Components/Risk Factors/Patient Goals Review:    Core Components/Risk Factors/Patient Goals at Discharge (Final Review):    ITP Comments:  ITP Comments    Row Name 03/28/21 1144 03/30/21 1351         ITP Comments Virtual orientation call completed today. shehas an appointment on Date: 03/30/2021 for EP eval and gym Orientation.  Documentation of diagnosis can be found in [facility] Date: 02/27/2021. Completed and gym orientation. Initial ITP created and sent for review to Dr. Bethann Punches, Medical Director.             Comments: Initial ITP

## 2021-03-31 DIAGNOSIS — I5022 Chronic systolic (congestive) heart failure: Secondary | ICD-10-CM | POA: Diagnosis not present

## 2021-03-31 NOTE — Progress Notes (Signed)
Daily Session Note  Patient Details  Name: [name] MRN: [id number] Date of Birth: 05/04/1996 Referring Provider:   Flowsheet Row Cardiac Rehab from 03/30/2021 in [facility] Cardiac and Pulmonary Rehab  Referring Provider Glori Bickers MD      Encounter Date: 03/31/2021  Check In:  Session Check In - 03/31/21 0724      Check-In   Supervising physician immediately available to respond to emergencies See telemetry face sheet for immediately available ER MD    Location [facility]-Cardiac & Pulmonary Rehab    Staff Present Birdie Sons, MPA, RN;Melissa Caiola RDN, Rowe Pavy, BA, ACSM CEP, Exercise Physiologist    Virtual Visit No    Medication changes reported     No    Fall or balance concerns reported    No    Warm-up and Cool-down Performed on first and last piece of equipment    Resistance Training Performed Yes    VAD Patient? No    PAD/SET Patient? No      Pain Assessment   Currently in Pain? No/denies              Social History   Tobacco Use  Smoking Status Never Smoker  Smokeless Tobacco Never Used    Goals Met:  Independence with exercise equipment Exercise tolerated well No report of cardiac concerns or symptoms Strength training completed today  Goals Unmet:  Not Applicable  Comments: First full day of exercise!  Patient was oriented to gym and equipment including functions, settings, policies, and procedures.  Patient's individual exercise prescription and treatment plan were reviewed.  All starting workloads were established based on the results of the 6 minute walk test done at initial orientation visit.  The plan for exercise progression was also introduced and progression will be customized based on patient's performance and goals.     Dr. Emily Filbert is Medical Director for [place].  Dr. Ottie Glazier is Medical Director for [facility] Pulmonary Rehabilitation.

## 2021-04-01 ENCOUNTER — Encounter: Payer: BC Managed Care – PPO | Admitting: *Deleted

## 2021-04-01 ENCOUNTER — Other Ambulatory Visit: Payer: Self-pay

## 2021-04-01 DIAGNOSIS — I5022 Chronic systolic (congestive) heart failure: Secondary | ICD-10-CM | POA: Diagnosis not present

## 2021-04-01 NOTE — Progress Notes (Signed)
Daily Session Note  Patient Details  Name: [name] MRN: [id number] Date of Birth: 10/02/1996 Referring Provider:   Flowsheet Row Cardiac Rehab from 03/30/2021 in [facility] Cardiac and Pulmonary Rehab  Referring Provider Glori Bickers MD      Encounter Date: 04/01/2021  Check In:  Session Check In - 04/01/21 0737      Check-In   Supervising physician immediately available to respond to emergencies See telemetry face sheet for immediately available ER MD    Location [facility]-Cardiac & Pulmonary Rehab    Staff Present Heath Lark, RN, BSN, CCRP;Jessica [place], MA, RCEP, CCRP, Marylynn Pearson, MS, ASCM CEP, Exercise Physiologist    Virtual Visit No    Medication changes reported     No    Fall or balance concerns reported    No    Warm-up and Cool-down Performed on first and last piece of equipment    Resistance Training Performed Yes    VAD Patient? No    PAD/SET Patient? No      Pain Assessment   Currently in Pain? No/denies              Social History   Tobacco Use  Smoking Status Never Smoker  Smokeless Tobacco Never Used    Goals Met:  Independence with exercise equipment Exercise tolerated well No report of cardiac concerns or symptoms  Goals Unmet:  Not Applicable  Comments: Pt able to follow exercise prescription today without complaint.  Will continue to monitor for progression.    Dr. Emily Filbert is Medical Director for [place].  Dr. Ottie Glazier is Medical Director for [facility] Pulmonary Rehabilitation.

## 2021-04-05 ENCOUNTER — Encounter: Payer: BC Managed Care – PPO | Admitting: *Deleted

## 2021-04-05 ENCOUNTER — Other Ambulatory Visit: Payer: Self-pay

## 2021-04-05 DIAGNOSIS — I5022 Chronic systolic (congestive) heart failure: Secondary | ICD-10-CM | POA: Diagnosis not present

## 2021-04-05 NOTE — Progress Notes (Signed)
Daily Session Note  Patient Details  Name: [name] MRN: [id number] Date of Birth: Dec 16, 1995 Referring Provider:   Flowsheet Row Cardiac Rehab from 03/30/2021 in [facility] Cardiac and Pulmonary Rehab  Referring Provider Glori Bickers MD      Encounter Date: 04/05/2021  Check In:      Social History   Tobacco Use  Smoking Status Never Smoker  Smokeless Tobacco Never Used    Goals Met:  Independence with exercise equipment Exercise tolerated well No report of cardiac concerns or symptoms  Goals Unmet:  Not Applicable  Comments: Pt able to follow exercise prescription today without complaint.  Will continue to monitor for progression.    Dr. Emily Filbert is Medical Director for [place].  Dr. Ottie Glazier is Medical Director for [facility] Pulmonary Rehabilitation.

## 2021-04-06 ENCOUNTER — Other Ambulatory Visit: Payer: Self-pay

## 2021-04-06 ENCOUNTER — Encounter: Payer: BC Managed Care – PPO | Attending: Internal Medicine | Admitting: *Deleted

## 2021-04-06 DIAGNOSIS — I5022 Chronic systolic (congestive) heart failure: Secondary | ICD-10-CM

## 2021-04-06 NOTE — Progress Notes (Signed)
Daily Session Note  Patient Details  Name: [name] MRN: [id number] Date of Birth: 1996/07/21 Referring Provider:   Flowsheet Row Cardiac Rehab from 03/30/2021 in [facility] Cardiac and Pulmonary Rehab  Referring Provider Glori Bickers MD      Encounter Date: 04/06/2021  Check In:  Session Check In - 04/06/21 0810      Check-In   Supervising physician immediately available to respond to emergencies See telemetry face sheet for immediately available ER MD    Location [facility]-Cardiac & Pulmonary Rehab    Staff Present Hope Budds RDN, Rowe Pavy, BA, ACSM CEP, Exercise Physiologist;Armen Waring, RN, BSN, CCRP    Virtual Visit No    Medication changes reported     No    Fall or balance concerns reported    No    Warm-up and Cool-down Performed on first and last piece of equipment    Resistance Training Performed Yes    VAD Patient? No    PAD/SET Patient? No      Pain Assessment   Currently in Pain? No/denies              Social History   Tobacco Use  Smoking Status Never Smoker  Smokeless Tobacco Never Used    Goals Met:  Independence with exercise equipment Exercise tolerated well No report of cardiac concerns or symptoms  Goals Unmet:  Not Applicable  Comments: Pt able to follow exercise prescription today without complaint.  Will continue to monitor for progression.    Dr. Emily Filbert is Medical Director for [place].  Dr. Ottie Glazier is Medical Director for [facility] Pulmonary Rehabilitation.

## 2021-04-12 ENCOUNTER — Encounter (HOSPITAL_COMMUNITY): Payer: Self-pay

## 2021-04-13 ENCOUNTER — Other Ambulatory Visit: Payer: Self-pay

## 2021-04-13 DIAGNOSIS — I5022 Chronic systolic (congestive) heart failure: Secondary | ICD-10-CM | POA: Diagnosis not present

## 2021-04-13 NOTE — Progress Notes (Signed)
Daily Session Note  Patient Details  Name: [name] MRN: [id number] Date of Birth: 07-Feb-1996 Referring Provider:   Flowsheet Row Cardiac Rehab from 03/30/2021 in [facility] Cardiac and Pulmonary Rehab  Referring Provider Glori Bickers MD      Encounter Date: 04/13/2021  Check In:  Session Check In - 04/13/21 0731      Check-In   Supervising physician immediately available to respond to emergencies See telemetry face sheet for immediately available ER MD    Location [facility]-Cardiac & Pulmonary Rehab    Staff Present Birdie Sons, MPA, Elveria Rising, BA, ACSM CEP, Exercise Physiologist;Joseph Tessie Fass RCP,RRT,BSRT    Virtual Visit No    Medication changes reported     No    Fall or balance concerns reported    No    Warm-up and Cool-down Performed on first and last piece of equipment    Resistance Training Performed Yes    VAD Patient? No    PAD/SET Patient? No      Pain Assessment   Currently in Pain? No/denies              Social History   Tobacco Use  Smoking Status Never Smoker  Smokeless Tobacco Never Used    Goals Met:  Independence with exercise equipment Exercise tolerated well No report of cardiac concerns or symptoms Strength training completed today  Goals Unmet:  Not Applicable  Comments: Pt able to follow exercise prescription today without complaint.  Will continue to monitor for progression.    Dr. Emily Filbert is Medical Director for [place].  Dr. Ottie Glazier is Medical Director for [facility] Pulmonary Rehabilitation.

## 2021-04-14 DIAGNOSIS — I5022 Chronic systolic (congestive) heart failure: Secondary | ICD-10-CM

## 2021-04-14 NOTE — Progress Notes (Signed)
Daily Session Note  Patient Details  Name: [name] MRN: [id number] Date of Birth: 1996-05-04 Referring Provider:   Flowsheet Row Cardiac Rehab from 03/30/2021 in [facility] Cardiac and Pulmonary Rehab  Referring Provider Glori Bickers MD       Encounter Date: 04/14/2021  Check In:  Session Check In - 04/14/21 0721       Check-In   Supervising physician immediately available to respond to emergencies See telemetry face sheet for immediately available ER MD    Location [facility]-Cardiac & Pulmonary Rehab    Staff Present Birdie Sons, MPA, Mauricia Area, BS, ACSM CEP, Exercise Physiologist;Amanda Oletta Darter, BA, ACSM CEP, Exercise Physiologist    Virtual Visit No    Medication changes reported     No    Fall or balance concerns reported    No    Warm-up and Cool-down Performed on first and last piece of equipment    Resistance Training Performed Yes    VAD Patient? No    PAD/SET Patient? No      Pain Assessment   Currently in Pain? No/denies                Social History   Tobacco Use  Smoking Status Never  Smokeless Tobacco Never    Goals Met:  Independence with exercise equipment Exercise tolerated well No report of cardiac concerns or symptoms Strength training completed today  Goals Unmet:  Not Applicable  Comments: Pt able to follow exercise prescription today without complaint.  Will continue to monitor for progression.    Dr. Emily Filbert is Medical Director for [place].  Dr. Ottie Glazier is Medical Director for [facility] Pulmonary Rehabilitation.

## 2021-04-15 ENCOUNTER — Ambulatory Visit: Payer: BC Managed Care – PPO | Admitting: Psychologist

## 2021-04-15 ENCOUNTER — Other Ambulatory Visit: Payer: Self-pay

## 2021-04-15 DIAGNOSIS — I5022 Chronic systolic (congestive) heart failure: Secondary | ICD-10-CM

## 2021-04-15 NOTE — Progress Notes (Signed)
Daily Session Note  Patient Details  Name: [name] MRN: [id number] Date of Birth: Apr 19, 1996 Referring Provider:   Flowsheet Row Cardiac Rehab from 03/30/2021 in [facility] Cardiac and Pulmonary Rehab  Referring Provider Glori Bickers MD       Encounter Date: 04/15/2021  Check In:  Session Check In - 04/15/21 0752       Check-In   Supervising physician immediately available to respond to emergencies See telemetry face sheet for immediately available ER MD    Location [facility]-Cardiac & Pulmonary Rehab    Staff Present Justin Mend RCP,RRT,BSRT;Jessica Luan Pulling, MA, RCEP, CCRP, CCET;Klaire Court RN, BSN    Virtual Visit No    Medication changes reported     No    Fall or balance concerns reported    No    Warm-up and Cool-down Performed on first and last piece of equipment    Resistance Training Performed Yes    VAD Patient? No    PAD/SET Patient? No      Pain Assessment   Currently in Pain? No/denies                Social History   Tobacco Use  Smoking Status Never  Smokeless Tobacco Never    Goals Met:  Independence with exercise equipment Exercise tolerated well No report of cardiac concerns or symptoms Strength training completed today  Goals Unmet:  Not Applicable  Comments: Pt able to follow exercise prescription today without complaint.  Will continue to monitor for progression.   Dr. Emily Filbert is Medical Director for [place].  Dr. Ottie Glazier is Medical Director for [facility] Pulmonary Rehabilitation.

## 2021-04-18 ENCOUNTER — Encounter: Payer: BC Managed Care – PPO | Admitting: *Deleted

## 2021-04-18 ENCOUNTER — Other Ambulatory Visit: Payer: Self-pay

## 2021-04-18 DIAGNOSIS — I5022 Chronic systolic (congestive) heart failure: Secondary | ICD-10-CM | POA: Diagnosis not present

## 2021-04-18 NOTE — Progress Notes (Signed)
Daily Session Note  Patient Details  Name: [name] MRN: [id number] Date of Birth: June 10, 1996 Referring Provider:   Flowsheet Row Cardiac Rehab from 03/30/2021 in [facility] Cardiac and Pulmonary Rehab  Referring Provider Glori Bickers MD       Encounter Date: 04/18/2021  Check In:  Session Check In - 04/18/21 1341       Check-In   Supervising physician immediately available to respond to emergencies See telemetry face sheet for immediately available ER MD    Location [facility]-Cardiac & Pulmonary Rehab    Staff Present Justin Mend Jaci Carrel, BS, ACSM CEP, Exercise Physiologist;Carrieanne Kleen, RN, BSN, CCRP    Virtual Visit No    Medication changes reported     No    Fall or balance concerns reported    No    Warm-up and Cool-down Performed on first and last piece of equipment    Resistance Training Performed Yes    VAD Patient? No    PAD/SET Patient? No      Pain Assessment   Currently in Pain? No/denies                Social History   Tobacco Use  Smoking Status Never  Smokeless Tobacco Never    Goals Met:  Independence with exercise equipment Exercise tolerated well No report of cardiac concerns or symptoms  Goals Unmet:  Not Applicable  Comments: Pt able to follow exercise prescription today without complaint.  Will continue to monitor for progression.    Dr. Emily Filbert is Medical Director for [place].  Dr. Ottie Glazier is Medical Director for [facility] Pulmonary Rehabilitation.

## 2021-04-20 ENCOUNTER — Other Ambulatory Visit: Payer: Self-pay

## 2021-04-20 ENCOUNTER — Other Ambulatory Visit (HOSPITAL_COMMUNITY): Payer: Self-pay | Admitting: *Deleted

## 2021-04-20 ENCOUNTER — Encounter: Payer: Self-pay | Admitting: *Deleted

## 2021-04-20 ENCOUNTER — Encounter (HOSPITAL_COMMUNITY): Payer: Self-pay

## 2021-04-20 DIAGNOSIS — I5022 Chronic systolic (congestive) heart failure: Secondary | ICD-10-CM

## 2021-04-20 MED ORDER — ENTRESTO 24-26 MG PO TABS: 1.0000 | ORAL_TABLET | Freq: Two times a day (BID) | ORAL | 3 refills | Status: DC

## 2021-04-20 NOTE — Progress Notes (Signed)
Daily Session Note  Patient Details  Name: [name] MRN: [id number] Date of Birth: Apr 28, 1996 Referring Provider:   Flowsheet Row Cardiac Rehab from 03/30/2021 in [facility] Cardiac and Pulmonary Rehab  Referring Provider Glori Bickers MD       Encounter Date: 04/20/2021  Check In:  Session Check In - 04/20/21 0720       Check-In   Supervising physician immediately available to respond to emergencies See telemetry face sheet for immediately available ER MD    Location [facility]-Cardiac & Pulmonary Rehab    Staff Present Birdie Sons, MPA, RN;Melissa Caiola RDN, LDN;Joseph Tessie Fass RCP,RRT,BSRT    Virtual Visit No    Medication changes reported     No    Fall or balance concerns reported    No    Warm-up and Cool-down Performed on first and last piece of equipment    Resistance Training Performed Yes    VAD Patient? No    PAD/SET Patient? No      Pain Assessment   Currently in Pain? No/denies                Social History   Tobacco Use  Smoking Status Never  Smokeless Tobacco Never    Goals Met:  Independence with exercise equipment Exercise tolerated well No report of cardiac concerns or symptoms Strength training completed today  Goals Unmet:  Not Applicable  Comments: Pt able to follow exercise prescription today without complaint.  Will continue to monitor for progression.    Dr. Emily Filbert is Medical Director for [place].  Dr. Ottie Glazier is Medical Director for [facility] Pulmonary Rehabilitation.

## 2021-04-20 NOTE — Progress Notes (Signed)
Cardiac Individual Treatment Plan  Patient Details  Name: [name] MRN: [id number] Date of Birth: May 21, 1996 Referring Provider:   Flowsheet Row Cardiac Rehab from 03/30/2021 in [facility] Cardiac and Pulmonary Rehab  Referring Provider Glori Bickers MD       Initial Encounter Date:  Flowsheet Row Cardiac Rehab from 03/30/2021 in [facility] Cardiac and Pulmonary Rehab  Date 03/30/21       Visit Diagnosis: Heart failure, chronic systolic ([place])  Patient's Home Medications on Admission:  Current Outpatient Medications:    apixaban (ELIQUIS) 5 MG TABS tablet, Take 1 tablet (5 mg total) by mouth 2 (two) times daily., Disp: 60 tablet, Rfl: 6   carvedilol (COREG) 6.25 MG tablet, Take 1 tablet (6.25 mg total) by mouth in the morning and at bedtime., Disp: 60 tablet, Rfl: 11   empagliflozin (JARDIANCE) 10 MG TABS tablet, Take 10 mg by mouth daily., Disp: , Rfl:    ENTRESTO 24-26 MG, Take 1 tablet by mouth 2 (two) times daily., Disp: , Rfl:    potassium chloride SA (KLOR-CON) 20 MEQ tablet, Take 1 tablet (20 mEq total) by mouth daily., Disp: 30 tablet, Rfl: 3   spironolactone (ALDACTONE) 25 MG tablet, Take 0.5 tablets (12.5 mg total) by mouth at bedtime., Disp: 45 tablet, Rfl: 3   TRI-ESTARYLLA 0.18/0.215/0.25 MG-35 MCG tablet, Take 1 tablet by mouth daily., Disp: , Rfl:    valACYclovir (VALTREX) 1000 MG tablet, Take 1,000 mg by mouth as needed for rash., Disp: , Rfl:   Past Medical History: Past Medical History:  Diagnosis Date   Lactose intolerance    NICM (nonischemic cardiomyopathy) (HCC)    PVC's (premature ventricular contractions)    Torsades de pointes (HCC)     Tobacco Use: Social History   Tobacco Use  Smoking Status Never  Smokeless Tobacco Never    Labs: Recent Review Flowsheet Data   There is no flowsheet data to display.      Exercise Target Goals: Exercise Program Goal: Individual exercise prescription set using results from initial 6 min walk test and  THRR while considering  patient's activity barriers and safety.   Exercise Prescription Goal: Initial exercise prescription builds to 30-45 minutes a day of aerobic activity, 2-3 days per week.  Home exercise guidelines will be given to patient during program as part of exercise prescription that the participant will acknowledge.   Education: Aerobic Exercise: - Group verbal and visual presentation on the components of exercise prescription. Introduces F.I.T.T principle from ACSM for exercise prescriptions.  Reviews F.I.T.T. principles of aerobic exercise including progression. Written material given at graduation.   Education: Resistance Exercise: - Group verbal and visual presentation on the components of exercise prescription. Introduces F.I.T.T principle from ACSM for exercise prescriptions  Reviews F.I.T.T. principles of resistance exercise including progression. Written material given at graduation.    Education: Exercise & Equipment Safety: - Individual verbal instruction and demonstration of equipment use and safety with use of the equipment. Flowsheet Row Cardiac Rehab from 04/20/2021 in [facility] Cardiac and Pulmonary Rehab  Date 03/30/21  Educator [facility]  Instruction Review Code 1- Verbalizes Understanding       Education: Exercise Physiology & General Exercise Guidelines: - Group verbal and written instruction with models to review the exercise physiology of the cardiovascular system and associated critical values. Provides general exercise guidelines with specific guidelines to those with heart or lung disease.  Flowsheet Row Cardiac Rehab from 04/20/2021 in [facility] Cardiac and Pulmonary Rehab  Date 04/20/21  Educator [facility]  Instruction Review Code 1- Verbalizes Understanding       Education: Flexibility, Balance, Mind/Body Relaxation: - Group verbal and visual presentation with interactive activity on the components of exercise prescription. Introduces F.I.T.T principle from ACSM for  exercise prescriptions. Reviews F.I.T.T. principles of flexibility and balance exercise training including progression. Also discusses the mind body connection.  Reviews various relaxation techniques to help reduce and manage stress (i.e. Deep breathing, progressive muscle relaxation, and visualization). Balance handout provided to take home. Written material given at graduation.   Activity Barriers & Risk Stratification:  Activity Barriers & Cardiac Risk Stratification - 03/30/21 1352       Activity Barriers & Cardiac Risk Stratification   Activity Barriers Shortness of Breath;Deconditioning    Cardiac Risk Stratification High             6 Minute Walk:  6 Minute Walk     Row Name 03/30/21 1351         6 Minute Walk   Phase Initial     Distance 1175 feet     Walk Time 6 minutes     # of Rest Breaks 0     MPH 2.22     METS 5.31     RPE 12     Perceived Dyspnea  1     VO2 Peak 18.57     Symptoms Yes (comment)     Comments SOB     Resting HR 79 bpm     Resting BP 104/62     Resting Oxygen Saturation  96 %     Exercise Oxygen Saturation  during 6 min walk 97 %     Max Ex. HR 110 bpm     Max Ex. BP 128/72     2 Minute Post BP 108/60              Oxygen Initial Assessment:   Oxygen Re-Evaluation:   Oxygen Discharge (Final Oxygen Re-Evaluation):   Initial Exercise Prescription:  Initial Exercise Prescription - 03/30/21 1300       Date of Initial Exercise RX and Referring Provider   Date 03/30/21    Referring Provider Glori Bickers MD      Treadmill   MPH 2.2    Grade 1    Minutes 15    METs 2.99      Recumbant Bike   Level 3    RPM 50    Watts 50    Minutes 15    METs 3      Recumbant Elliptical   Level 2    RPM 50    Minutes 15    METs 3      Elliptical   Level 1    Speed 4    Minutes 15    METs 3      REL-XR   Level 3    Speed 50    Minutes 15    METs 3      Prescription Details   Frequency (times per week) 3     Duration Progress to 30 minutes of continuous aerobic without signs/symptoms of physical distress      Intensity   THRR 40-80% of Max Heartrate 126-173    Ratings of Perceived Exertion 11-13    Perceived Dyspnea 0-4      Progression   Progression Continue to progress workloads to maintain intensity without signs/symptoms of physical distress.      Resistance Training   Training Prescription Yes  Weight 4 lb    Reps 10-15             Perform Capillary Blood Glucose checks as needed.  Exercise Prescription Changes:   Exercise Prescription Changes     Row Name 03/30/21 1300 04/06/21 0900 04/11/21 1100         Response to Exercise   Blood Pressure (Admit) 104/62 -- 98/68     Blood Pressure (Exercise) 128/72 -- 102/58     Blood Pressure (Exit) 108/60 -- 104/64     Heart Rate (Admit) 79 bpm -- 77 bpm     Heart Rate (Exercise) 110 bpm -- 110 bpm     Heart Rate (Exit) 87 bpm -- 92 bpm     Oxygen Saturation (Admit) 96 % -- --     Oxygen Saturation (Exercise) 97 % -- --     Rating of Perceived Exertion (Exercise) 12 -- 13     Perceived Dyspnea (Exercise) 1 -- --     Symptoms SOB -- --     Comments walk test -- --     Duration -- -- Continue with 30 min of aerobic exercise without signs/symptoms of physical distress.     Intensity -- -- THRR unchanged           Resistance Training       Training Prescription -- -- Yes     Weight -- -- 4 lb     Reps -- -- 10-15           Treadmill       MPH -- -- 2.2     Grade -- -- 1     Minutes -- -- 15     METs -- -- 2.99           REL-XR       Level -- -- 1     Minutes -- -- 15           Home Exercise Plan       Plans to continue exercise at -- Home (comment)  walking, treadmill, staff videos --     Frequency -- Add 2 additional days to program exercise sessions. --     Initial Home Exercises Provided -- 04/06/21 --             Exercise Comments:   Exercise Comments     Row Name 03/31/21 0725            Exercise Comments First full day of exercise!  Patient was oriented to gym and equipment including functions, settings, policies, and procedures.  Patient's individual exercise prescription and treatment plan were reviewed.  All starting workloads were established based on the results of the 6 minute walk test done at initial orientation visit.  The plan for exercise progression was also introduced and progression will be customized based on patient's performance and goals.                Exercise Goals and Review:   Exercise Goals     Row Name 03/30/21 1355             Exercise Goals   Increase Physical Activity Yes       Intervention Provide advice, education, support and counseling about physical activity/exercise needs.;Develop an individualized exercise prescription for aerobic and resistive training based on initial evaluation findings, risk stratification, comorbidities and participant's personal goals.       Expected Outcomes Short Term: Attend rehab on a regular basis to  increase amount of physical activity.;Long Term: Add in home exercise to make exercise part of routine and to increase amount of physical activity.;Long Term: Exercising regularly at least 3-5 days a week.       Increase Strength and Stamina Yes       Intervention Provide advice, education, support and counseling about physical activity/exercise needs.;Develop an individualized exercise prescription for aerobic and resistive training based on initial evaluation findings, risk stratification, comorbidities and participant's personal goals.       Expected Outcomes Short Term: Increase workloads from initial exercise prescription for resistance, speed, and METs.;Short Term: Perform resistance training exercises routinely during rehab and add in resistance training at home;Long Term: Improve cardiorespiratory fitness, muscular endurance and strength as measured by increased METs and functional capacity (6MWT)       Able  to understand and use rate of perceived exertion (RPE) scale Yes       Intervention Provide education and explanation on how to use RPE scale       Expected Outcomes Short Term: Able to use RPE daily in rehab to express subjective intensity level;Long Term:  Able to use RPE to guide intensity level when exercising independently       Able to understand and use Dyspnea scale Yes       Intervention Provide education and explanation on how to use Dyspnea scale       Expected Outcomes Short Term: Able to use Dyspnea scale daily in rehab to express subjective sense of shortness of breath during exertion;Long Term: Able to use Dyspnea scale to guide intensity level when exercising independently       Knowledge and understanding of Target Heart Rate Range (THRR) Yes       Intervention Provide education and explanation of THRR including how the numbers were predicted and where they are located for reference       Expected Outcomes Short Term: Able to state/look up THRR;Long Term: Able to use THRR to govern intensity when exercising independently;Short Term: Able to use daily as guideline for intensity in rehab       Able to check pulse independently Yes       Intervention Provide education and demonstration on how to check pulse in carotid and radial arteries.;Review the importance of being able to check your own pulse for safety during independent exercise       Expected Outcomes Short Term: Able to explain why pulse checking is important during independent exercise;Long Term: Able to check pulse independently and accurately       Understanding of Exercise Prescription Yes       Intervention Provide education, explanation, and written materials on patient's individual exercise prescription       Expected Outcomes Short Term: Able to explain program exercise prescription;Long Term: Able to explain home exercise prescription to exercise independently                Exercise Goals Re-Evaluation :   Exercise Goals Re-Evaluation     Row Name 03/31/21 0725 04/06/21 0915 04/11/21 1148         Exercise Goal Re-Evaluation   Exercise Goals Review Able to understand and use rate of perceived exertion (RPE) scale;Increase Physical Activity;Knowledge and understanding of Target Heart Rate Range (THRR);Understanding of Exercise Prescription;Increase Strength and Stamina;Able to understand and use Dyspnea scale;Able to check pulse independently Increase Physical Activity;Increase Strength and Stamina;Able to understand and use rate of perceived exertion (RPE) scale;Able to understand and use Dyspnea scale;Knowledge and  understanding of Target Heart Rate Range (THRR);Able to check pulse independently;Understanding of Exercise Prescription Increase Physical Activity;Increase Strength and Stamina     Comments Reviewed RPE and dyspnea scales, THR and program prescription with pt today.  Pt voiced understanding and was given a copy of goals to take home. Reviewed home exercise with pt today.  Pt plans to walk and use parents' treadmill at home for exercise.  Reviewed THR, pulse, RPE, sign and symptoms, pulse oximetery and when to call 911 or MD.  Also discussed weather considerations and indoor options.  Pt voiced understanding. Langley Gauss is doing well with exercise. She attends consistently.  We will monitor progress.     Expected Outcomes Short: Use RPE daily to regulate intensity. Long: Follow program prescription in THR. Short: Add in more exercise at home and walk while on vacation over weekend Long: Continue to improve stamina Short:  continue to exercise consistently Long:  improve stamina and MET level              Discharge Exercise Prescription (Final Exercise Prescription Changes):  Exercise Prescription Changes - 04/11/21 1100       Response to Exercise   Blood Pressure (Admit) 98/68    Blood Pressure (Exercise) 102/58    Blood Pressure (Exit) 104/64    Heart Rate (Admit) 77 bpm    Heart  Rate (Exercise) 110 bpm    Heart Rate (Exit) 92 bpm    Rating of Perceived Exertion (Exercise) 13    Duration Continue with 30 min of aerobic exercise without signs/symptoms of physical distress.    Intensity THRR unchanged      Resistance Training   Training Prescription Yes    Weight 4 lb    Reps 10-15      Treadmill   MPH 2.2    Grade 1    Minutes 15    METs 2.99      REL-XR   Level 1    Minutes 15             Nutrition:  Target Goals: Understanding of nutrition guidelines, daily intake of sodium <1547m, cholesterol <2030m calories 30% from fat and 7% or less from saturated fats, daily to have 5 or more servings of fruits and vegetables.  Education: All About Nutrition: -Group instruction provided by verbal, written material, interactive activities, discussions, models, and posters to present general guidelines for heart healthy nutrition including fat, fiber, MyPlate, the role of sodium in heart healthy nutrition, utilization of the nutrition label, and utilization of this knowledge for meal planning. Follow up email sent as well. Written material given at graduation.   Biometrics:  Pre Biometrics - 03/30/21 1355       Pre Biometrics   Height 5' 7.5" (1.715 m)    Weight 180 lb 4.8 oz (81.8 kg)    BMI (Calculated) 27.81    Single Leg Stand 30 seconds              Nutrition Therapy Plan and Nutrition Goals:  Nutrition Therapy & Goals - 04/14/21 1141       Nutrition Therapy   Diet Heart healthy, low Na    Protein (specify units) 65g    Fiber 25 grams    Whole Grain Foods 3 servings    Saturated Fats 12 max. 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 She feels she  doesn't need to review any education, but would like to discuss easy ways to get back into her normal eating routine. Discussed ingredient prepping and using ingredients that could be used in a variety of ways especially as leftovers.      Intervention Plan   Intervention Prescribe, educate and counsel regarding individualized specific dietary modifications aiming towards targeted core components such as weight, hypertension, lipid management, diabetes, heart failure and other comorbidities.    Expected Outcomes Short Term Goal: Understand basic principles of dietary content, such as calories, fat, sodium, cholesterol and nutrients.;Short Term Goal: A plan has been developed with personal nutrition goals set during dietitian appointment.;Long Term Goal: Adherence to prescribed nutrition plan.             Nutrition Assessments:  MEDIFICTS Score Key: ?70 Need to make dietary changes  40-70 Heart Healthy Diet ? 40 Therapeutic Level Cholesterol Diet  Flowsheet Row Cardiac Rehab from 03/30/2021 in [facility] Cardiac and Pulmonary Rehab  Picture Your Plate Total Score on Admission 78      Picture Your Plate Scores: <09 Unhealthy dietary pattern with much room for improvement. 41-50 Dietary pattern unlikely to meet recommendations for good health and room for improvement. 51-60 More healthful dietary pattern, with some room for improvement.  >60 Healthy dietary pattern, although there may be some specific behaviors that could be improved.    Nutrition Goals Re-Evaluation:   Nutrition Goals Discharge (Final Nutrition Goals Re-Evaluation):   Psychosocial: Target Goals: Acknowledge presence or absence of significant depression and/or stress, maximize coping skills, provide positive support system. Participant is able to verbalize types and ability to use techniques and skills needed for reducing stress and depression.   Education: Stress, Anxiety, and Depression - Group verbal and visual  presentation to define topics covered.  Reviews how body is impacted by stress, anxiety, and depression.  Also discusses healthy ways to reduce stress and to treat/manage anxiety and depression.  Written material given at graduation. Flowsheet Row Cardiac Rehab from 04/20/2021 in [facility] Cardiac and Pulmonary Rehab  Date 04/13/21  Educator [facility]  Instruction Review Code 1- [employer] Understanding       Education: Sleep Hygiene -Provides group verbal and written instruction about how sleep can affect your health.  Define sleep hygiene, discuss sleep cycles and impact of sleep habits. Review good sleep hygiene tips.    Initial Review & Psychosocial Screening:  Initial Psych Review & Screening - 03/28/21 1126       Initial Review   Current issues with Current Stress Concerns   History of generalized Anxiety   Source of Stress Concerns Unable to participate in former interests or hobbies    Comments Home from school in [place] program is virtual . 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      Family Dynamics   Good Support System? Yes   DIrect family.  Mom and Dad and younger sister , boyfrient, best frient in [place], sister not living at home     Barriers   Psychosocial barriers to participate in program There are no identifiable barriers or psychosocial needs.;The patient should benefit from training in stress management and relaxation.      Screening Interventions   Interventions Encouraged to exercise    Expected Outcomes Short Term  goal: Utilizing psychosocial counselor, staff and physician to assist with identification of specific Stressors or current issues interfering with healing process. Setting desired goal for each stressor or current issue identified.;Long Term Goal: Stressors or current issues are controlled or eliminated.;Short Term goal: Identification and review with participant of any Quality of Life or Depression concerns found by scoring the questionnaire.;Long Term  goal: The participant improves quality of Life and PHQ9 Scores as seen by post scores and/or verbalization of changes             Quality of Life Scores:   Quality of Life - 03/30/21 1356       Quality of Life   Select Quality of Life      Quality of Life Scores   Health/Function Pre 12.13 %    Socioeconomic Pre 25.56 %    Psych/Spiritual Pre 18.71 %    Family Pre 19.8 %    GLOBAL Pre 17.61 %            Scores of 19 and below usually indicate a poorer quality of life in these areas.  A difference of  2-3 points is a clinically meaningful difference.  A difference of 2-3 points in the total score of the Quality of Life Index has been associated with significant improvement in overall quality of life, self-image, physical symptoms, and general health in studies assessing change in quality of life.  PHQ-9: Recent Review Flowsheet Data     Depression screen [facility] 2/9 03/30/2021   Decreased Interest 1   Down, Depressed, Hopeless 1   PHQ - 2 Score 2   Altered sleeping 0   Tired, decreased energy 1   Change in appetite 1   Feeling bad or failure about yourself  0   Trouble concentrating 0   Moving slowly or fidgety/restless 0   Suicidal thoughts 0   PHQ-9 Score 4   Difficult doing work/chores Somewhat difficult      Interpretation of Total Score  Total Score Depression Severity:  1-4 = Minimal depression, 5-9 = Mild depression, 10-14 = Moderate depression, 15-19 = Moderately severe depression, 20-27 = Severe depression   Psychosocial Evaluation and Intervention:  Psychosocial Evaluation - 03/28/21 1144       Psychosocial Evaluation & Interventions   Interventions Encouraged to exercise with the program and follow exercise prescription    Comments Tyliyah has no barriers to attending the program. [facility] lives with her parents and younger suister at the [place]. She has come home for her heart failure treatment and will be in [place] for awhile. Her support system is good:   Besides her family at home she has a boyfrientd in [place] she goes to school. She has a best friend and her other sister. 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 She is seeing a counselor regarding all the health issues and how it is effecting her at the moment. She is not on any meds for depression nor anxiety. She has a history of some generalized anxiety and was taken off the meds she was taking as it may have effected her heart rhythm. She is doing well without the meds at this moment. She is ready to get started and wants to learn all she can to become as healthy as she can to get back to her routine and previous life activities. She should do  well in the program    Expected Outcomes STG She attends all scheduled sessions, she continues to see her counselor as needed. LTG: She is able to continue to progress to return to her previous level of activities.    Continue Psychosocial Services  Follow up required by staff             Psychosocial Re-Evaluation:   Psychosocial Discharge (Final Psychosocial Re-Evaluation):   Vocational Rehabilitation: Provide vocational rehab assistance to qualifying candidates.   Vocational Rehab Evaluation & Intervention:  Vocational Rehab - 03/28/21 1136       Initial Vocational Rehab Evaluation & Intervention   Assessment shows need for Vocational Rehabilitation No             Education: Education Goals: Education classes will be provided on a variety of topics geared toward better understanding of heart health and risk factor modification. Participant will state understanding/return demonstration of topics presented as noted by education test scores.  Learning Barriers/Preferences:  Learning Barriers/Preferences - 03/28/21 1135       Learning Barriers/Preferences   Learning Barriers None    Learning  Preferences None             General Cardiac Education Topics:  AED/CPR: - Group verbal and written instruction with the use of models to demonstrate the basic use of the AED with the basic ABC's of resuscitation.   Anatomy and Cardiac Procedures: - Group verbal and visual presentation and models provide information about basic cardiac anatomy and function. Reviews the testing methods done to diagnose heart disease and the outcomes of the test results. Describes the treatment choices: Medical Management, Angioplasty, or Coronary Bypass Surgery for treating various heart conditions including Myocardial Infarction, Angina, Valve Disease, and Cardiac Arrhythmias.  Written material given at graduation.   Medication Safety: - Group verbal and visual instruction to review commonly prescribed medications for heart and lung disease. Reviews the medication, class of the drug, and side effects. Includes the steps to properly store meds and maintain the prescription regimen.  Written material given at graduation.   Intimacy: - Group verbal instruction through game format to discuss how heart and lung disease can affect sexual intimacy. Written material given at graduation..   Know Your Numbers and Heart Failure: - Group verbal and visual instruction to discuss disease risk factors for cardiac and pulmonary disease and treatment options.  Reviews associated critical values for Overweight/Obesity, Hypertension, Cholesterol, and Diabetes.  Discusses basics of heart failure: signs/symptoms and treatments.  Introduces Heart Failure Zone chart for action plan for heart failure.  Written material given at graduation. Flowsheet Row Cardiac Rehab from 04/20/2021 in [facility] Cardiac and Pulmonary Rehab  Date 03/31/21  Educator SB  Instruction Review Code 1- Verbalizes Understanding       Infection Prevention: - Provides verbal and written material to individual with discussion of infection control  including proper hand washing and proper equipment cleaning during exercise session. Flowsheet Row Cardiac Rehab from 04/20/2021 in [facility] Cardiac and Pulmonary Rehab  Date 03/30/21  Educator [facility]  Instruction Review Code 1- Verbalizes Understanding       Falls Prevention: - Provides verbal and written material to individual with discussion of falls prevention and safety. Flowsheet Row Cardiac Rehab from 04/20/2021 in [facility] Cardiac and Pulmonary Rehab  Date 03/28/21  Educator SB  Instruction Review Code 1- Verbalizes Understanding       Other: -Provides group and verbal instruction on various topics (see comments)   Knowledge Questionnaire Score:  Knowledge Questionnaire Score - 03/30/21 1357       Knowledge Questionnaire Score   Pre Score 26/26             Core Components/Risk Factors/Patient Goals at Admission:  Personal Goals and Risk Factors at Admission - 03/30/21 1357       Core Components/Risk Factors/Patient Goals on Admission    Weight Management Yes;Weight Loss    Intervention Weight Management: Develop a combined nutrition and exercise program designed to reach desired caloric intake, while maintaining appropriate intake of nutrient and fiber, sodium and fats, and appropriate energy expenditure required for the weight goal.;Weight Management: Provide education and appropriate resources to help participant work on and attain dietary goals.;Weight Management/Obesity: Establish reasonable short term and long term weight goals.    Admit Weight 180 lb 4.8 oz (81.8 kg)    Goal Weight: Short Term 175 lb (79.4 kg)    Goal Weight: Long Term 175 lb (79.4 kg)    Expected Outcomes Short Term: Continue to assess and modify interventions until short term weight is achieved;Long Term: Adherence to nutrition and physical activity/exercise program aimed toward attainment of established weight goal;Weight Loss: Understanding of general recommendations for a balanced deficit meal plan,  which promotes 1-2 lb weight loss per week and includes a negative energy balance of [phone] kcal/d;Understanding recommendations for meals to include 15-35% energy as protein, 25-35% energy from fat, 35-60% energy from carbohydrates, less than 289m of dietary cholesterol, 20-35 gm of total fiber daily;Understanding of distribution of calorie intake throughout the day with the consumption of 4-5 meals/snacks    Heart Failure Yes    Intervention Provide a combined exercise and nutrition program that is supplemented with education, support and counseling about heart failure. Directed toward relieving symptoms such as shortness of breath, decreased exercise tolerance, and extremity edema.    Expected Outcomes Improve functional capacity of life;Short term: Attendance in program 2-3 days a week with increased exercise capacity. Reported lower sodium intake. Reported increased fruit and vegetable intake. Reports medication compliance.;Short term: Daily weights obtained and reported for increase. Utilizing diuretic protocols set by physician.;Long term: Adoption of self-care skills and reduction of barriers for early signs and symptoms recognition and intervention leading to self-care maintenance.             Education:Diabetes - Individual verbal and written instruction to review signs/symptoms of diabetes, desired ranges of glucose level fasting, after meals and with exercise. Acknowledge that pre and post exercise glucose checks will be done for 3 sessions at entry of program.   Core Components/Risk Factors/Patient Goals Review:    Core Components/Risk Factors/Patient Goals at Discharge (Final Review):    ITP Comments:  ITP Comments     Row Name 03/28/21 1144 03/30/21 1351 03/31/21 0724 04/20/21 0735     ITP Comments Virtual orientation call completed today. shehas an appointment on Date: 03/30/2021 for EP eval and gym Orientation.  [facility]. Completed 6MWT and gym orientation. Initial ITP created and sent for review to Dr. MEmily Filbert Medical Director. First full day of exercise!  Patient was oriented to gym and equipment including functions, settings, policies, and procedures.  Patient's individual exercise prescription and treatment plan were reviewed.  All starting workloads were established based on the results of the 6 minute walk test done at initial orientation visit.  The plan for exercise progression was also introduced and progression will be customized based on patient's performance and goals.  30 Day review completed. Medical Director ITP review done, changes made as directed, and signed approval by Medical Director.   new to program             Comments:

## 2021-04-22 ENCOUNTER — Encounter: Payer: BC Managed Care – PPO | Admitting: *Deleted

## 2021-04-22 ENCOUNTER — Other Ambulatory Visit: Payer: Self-pay

## 2021-04-22 DIAGNOSIS — I5022 Chronic systolic (congestive) heart failure: Secondary | ICD-10-CM | POA: Diagnosis not present

## 2021-04-22 NOTE — Progress Notes (Signed)
Daily Session Note  Patient Details  Name: [name] MRN: [id number] Date of Birth: 09-06-1996 Referring Provider:   Flowsheet Row Cardiac Rehab from 03/30/2021 in [facility] Cardiac and Pulmonary Rehab  Referring Provider Glori Bickers MD       Encounter Date: 04/22/2021  Check In:  Session Check In - 04/22/21 0844       Check-In   Supervising physician immediately available to respond to emergencies See telemetry face sheet for immediately available ER MD    Location [facility]-Cardiac & Pulmonary Rehab    Staff Present Hope Budds RDN, LDN;Joseph Hood RCP,RRT,BSRT;Heath Lark, RN, BSN, CCRP    Virtual Visit No    Medication changes reported     No    Fall or balance concerns reported    No    Warm-up and Cool-down Performed on first and last piece of equipment    Resistance Training Performed Yes    VAD Patient? No    PAD/SET Patient? No      Pain Assessment   Currently in Pain? No/denies                Social History   Tobacco Use  Smoking Status Never  Smokeless Tobacco Never    Goals Met:  Independence with exercise equipment Exercise tolerated well No report of cardiac concerns or symptoms  Goals Unmet:  Not Applicable  Comments: Pt able to follow exercise prescription today without complaint.  Will continue to monitor for progression.    Dr. Emily Filbert is Medical Director for [place].  Dr. Ottie Glazier is Medical Director for [facility] Pulmonary Rehabilitation.

## 2021-05-02 ENCOUNTER — Encounter (HOSPITAL_COMMUNITY): Payer: Self-pay | Admitting: Internal Medicine

## 2021-05-02 ENCOUNTER — Other Ambulatory Visit: Payer: Self-pay

## 2021-05-02 ENCOUNTER — Encounter: Payer: BC Managed Care – PPO | Admitting: *Deleted

## 2021-05-02 ENCOUNTER — Ambulatory Visit (HOSPITAL_COMMUNITY)
Admission: RE | Admit: 2021-05-02 | Discharge: 2021-05-02 | Disposition: A | Discharge status: Home / Self Care | Payer: BC Managed Care – PPO | Source: Ambulatory Visit | Attending: Internal Medicine | Admitting: Internal Medicine

## 2021-05-02 VITALS — BP 122/80 | HR 86 | Wt 183.4 lb

## 2021-05-02 DIAGNOSIS — I428 Other cardiomyopathies: Secondary | ICD-10-CM | POA: Insufficient documentation

## 2021-05-02 DIAGNOSIS — Z87891 Personal history of nicotine dependence: Secondary | ICD-10-CM | POA: Insufficient documentation

## 2021-05-02 DIAGNOSIS — I493 Ventricular premature depolarization: Secondary | ICD-10-CM

## 2021-05-02 DIAGNOSIS — I5022 Chronic systolic (congestive) heart failure: Secondary | ICD-10-CM

## 2021-05-02 DIAGNOSIS — Z95810 Presence of automatic (implantable) cardiac defibrillator: Secondary | ICD-10-CM | POA: Insufficient documentation

## 2021-05-02 DIAGNOSIS — M7989 Other specified soft tissue disorders: Secondary | ICD-10-CM | POA: Diagnosis not present

## 2021-05-02 DIAGNOSIS — Z79899 Other long term (current) drug therapy: Secondary | ICD-10-CM | POA: Insufficient documentation

## 2021-05-02 DIAGNOSIS — Z86718 Personal history of other venous thrombosis and embolism: Secondary | ICD-10-CM | POA: Insufficient documentation

## 2021-05-02 DIAGNOSIS — Z7901 Long term (current) use of anticoagulants: Secondary | ICD-10-CM | POA: Insufficient documentation

## 2021-05-02 LAB — COMPREHENSIVE METABOLIC PANEL
ALT: 16 U/L (ref 0–44)
AST: 20 U/L (ref 15–41)
Albumin: 3.7 g/dL (ref 3.5–5.0)
Alkaline Phosphatase: 40 U/L (ref 38–126)
Anion gap: 9 (ref 5–15)
BUN: 10 mg/dL (ref 6–20)
CO2: 26 mmol/L (ref 22–32)
Calcium: 9.2 mg/dL (ref 8.9–10.3)
Chloride: 104 mmol/L (ref 98–111)
Creatinine, Ser: 0.72 mg/dL (ref 0.44–1.00)
GFR, Estimated: >60 mL/min (ref >60)
Glucose, Bld: 94 mg/dL (ref 70–99)
Potassium: 3.8 mmol/L (ref 3.5–5.1)
Sodium: 139 mmol/L (ref 135–145)
Total Bilirubin: 0.4 mg/dL (ref 0.3–1.2)
Total Protein: 6.9 g/dL (ref 6.5–8.1)

## 2021-05-02 LAB — BRAIN NATRIURETIC PEPTIDE: B Natriuretic Peptide: 100.4 pg/mL — ABNORMAL HIGH (ref 0.0–100.0)

## 2021-05-02 LAB — MAGNESIUM: Magnesium: 2.2 mg/dL (ref 1.7–2.4)

## 2021-05-02 NOTE — Progress Notes (Signed)
ADVANCED HF CLINIC NOTE  PCP: Patient, [name] Primary Cardiologist: Little Ishikawa, MD  HPI:  [name] is a 25 y.o. 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   Had COVID-19 10/21 (vaccianted not boosted).   She presented to the ED in [place] on 11/11/2020 with CP and palpitations. Chest CT no evidence of PE.  Hs trops (72 > 73 > 68).    Echocardiogram 12/03/2020  EF 15-20% normal RV, no significant valvular dz. Holter with rare ectopy and 6 beats run of NSVT. Myoview with anterior defect, Cath 01/10/2021 showed normal coronary arteries, normal LVEDP.     cMRI 01/26/21 LVEF 31% markedly dilated No significant LGE. RV normal. No non-compaction or other pathology  I saw her in 3/22 and placed a Zio patch to quantify PVC burden. ZIo showed multiple short runs VT/TdP. Admitted for ICD. CRT-D placed 02/18/21 with RA lead to overdrive pace given TdP occurred during nighttime bradycardia .  4/22 found to have LUE DVT  Here for f/u with her mom. Feeling pretty good. Started CR about 4-5 weeks ago. Walking TM and using bike and elliptical. Now at 4-5 METs. Feels fine during exercise. However post exercise gets very tired and gets muscle twitches. No problem with regular activities. Last week had several days where she had 6-7 miles on hew Watch. No edema, orthopnea or PND. No problems with her meds. BP at CR today was 94/58 pre-exercise. SBPs in 90s at home.    ROS: All systems negative except as listed in HPI, PMH and Problem List.  SH:  Social History   Socioeconomic History   Marital status: Single    Spouse name: Not on file   Number of children: Not on file   Years of education: Not on file   Highest education level: Not on file  Occupational History   Not on file  Tobacco Use   Smoking status: Never   Smokeless  tobacco: Never  Substance and Sexual Activity   Alcohol use: No   Drug use: No   Sexual activity: Never  Other Topics Concern   Not on file  Social History Narrative   [employer] high[employer]   10th grade.   volleyball   Social Determinants of Corporate investment banker Strain: Not on file  Food Insecurity: Not on file  Transportation Needs: Not on file  Physical Activity: Not on file  Stress: Not on file  Social Connections: Not on file  Intimate Partner Violence: Not on file    FH:  Family History  Problem Relation Age of Onset   Hyperlipidemia Maternal Grandfather     Past Medical History:  Diagnosis Date   Lactose intolerance    NICM (nonischemic cardiomyopathy) (HCC)    PVC's (premature ventricular contractions)    Torsades de pointes (HCC)     Current Outpatient Medications  Medication Sig Dispense Refill   apixaban (ELIQUIS) 5 MG TABS tablet Take 1 tablet (5 mg total) by mouth 2 (two) times daily. 60 tablet 6   carvedilol (COREG) 6.25 MG tablet Take 1 tablet (6.25 mg total) by mouth in the morning and at bedtime. 60 tablet 11   empagliflozin (JARDIANCE) 10 MG TABS tablet Take 10 mg by mouth daily.     ENTRESTO 24-26 MG Take 1 tablet by mouth 2 (two) times daily.  60 tablet 3   potassium chloride SA (KLOR-CON) 20 MEQ tablet Take 1 tablet (20 mEq total) by mouth daily. 30 tablet 3   spironolactone (ALDACTONE) 25 MG tablet Take 0.5 tablets (12.5 mg total) by mouth at bedtime. 45 tablet 3   TRI-ESTARYLLA 0.18/0.215/0.25 MG-35 MCG tablet Take 1 tablet by mouth daily.     valACYclovir (VALTREX) 1000 MG tablet Take 1,000 mg by mouth as needed for rash.     No current facility-administered medications for this encounter.    Vitals:   05/02/21 1322  BP: 122/80  Pulse: 86  SpO2: 97%  Weight: 83.2 kg (183 lb 6.4 oz)    PHYSICAL EXAM: General:  Well appearing. No resp difficulty HEENT: normal Neck: supple. no JVD. Carotids 2+ bilat; no bruits. No lymphadenopathy  or thryomegaly appreciated. Cor: PMI nondisplaced. Regular rate & rhythm. No rubs, gallops or murmurs. Lungs: clear Abdomen: obese soft, nontender, nondistended. No hepatosplenomegaly. No bruits or masses. Good bowel sounds. Extremities: no cyanosis, clubbing, rash, edema Neuro: alert & orientedx3, cranial nerves grossly intact. moves all 4 extremities w/o difficulty. Affect pleasant   ICD interrogation: No VT/AF. Personally reviewed  ASSESSMENT & PLAN:  1. Chronic HFrEF - due to NICM. Suspect viral (? COVID) myocarditis - Echo 1/22 EF 15- 20% - [place] 01/10/2021 in [place] showed normal coronary arteries, normal LVEDP.  - cMRI 01/26/21 LVEF 31% markedly dilated No significant LGE. RV normal. No non-compaction or other pathology - -Echo 4/22 EF 25% (I felt 30-35%) - Going to CR and improving. Now NYHA II-early III - Volume status looks good - Continue Jardiance 10 - Continue Entresto 24/26 bid - Continue spiro 12.5 - Continue carvedilol 6.25 bid - BP too low to titrate GDMT - F/u echo in 2 months  - Labs today  2.Torsades/VT - Zio 3/22 with multiple episodes VT/TdP. PVCs 1% - CRT-D placed 02/18/21 with RA lead to overdrive pace given TdP occurred during nighttime bradycardia . - No driving for 6 months.  - ICD site looks good.  - ICD interrogation No VT/VF. Fluid ok   3. LUE DVT - u/s 4/22 - On Eliquis. No bleeding.  - Check CBC today    Arvilla Meres, MD  1:36 PM

## 2021-05-02 NOTE — Addendum Note (Signed)
Encounter addended by: Noralee Space, RN on: 05/02/2021 2:15 PM  Actions taken: Order list changed, Diagnosis association updated, Clinical Note Signed, Charge Capture section accepted

## 2021-05-02 NOTE — Progress Notes (Signed)
Daily Session Note  Patient Details  Name: [name] MRN: [id number] Date of Birth: [birth year] Referring Provider:   Flowsheet Row Cardiac Rehab from 03/30/2021 in [facility] Cardiac and Pulmonary Rehab  Referring Provider Glori Bickers MD       Encounter Date: 05/02/2021  Check In:  Session Check In - 05/02/21 0804       Check-In   Supervising physician immediately available to respond to emergencies See telemetry face sheet for immediately available ER MD    Location [facility]-Cardiac & Pulmonary Rehab    Staff Present Heath Lark, RN, BSN, CCRP;Joseph Hood RCP,RRT,BSRT;Kelly [place], [place], ACSM CEP, Exercise Physiologist    Virtual Visit No    Medication changes reported     No    Fall or balance concerns reported    No    Warm-up and Cool-down Performed on first and last piece of equipment    Resistance Training Performed Yes    VAD Patient? No    PAD/SET Patient? No      Pain Assessment   Currently in Pain? No/denies                Social History   Tobacco Use  Smoking Status Never  Smokeless Tobacco Never    Goals Met:  Independence with exercise equipment Exercise tolerated well No report of cardiac concerns or symptoms  Goals Unmet:  Not Applicable  Comments: Pt able to follow exercise prescription today without complaint.  Will continue to monitor for progression.    Dr. Emily Filbert is Medical Director for [place].  Dr. Ottie Glazier is Medical Director for [facility] Pulmonary Rehabilitation.

## 2021-05-02 NOTE — Patient Instructions (Signed)
Labs done today, your results will be available in MyChart, we will contact you for abnormal readings.  Your physician recommends that you schedule a follow-up appointment in: 2 months with echocardiogram  If you have any questions or concerns before your next appointment please send us a message through mychart or call our office at [phone].    TO LEAVE A MESSAGE FOR THE NURSE SELECT OPTION 2, PLEASE LEAVE A MESSAGE INCLUDING: . YOUR NAME . DATE OF BIRTH . CALL BACK NUMBER . REASON FOR CALL**this is important as we prioritize the call backs  YOU WILL RECEIVE A CALL BACK THE SAME DAY AS LONG AS YOU CALL BEFORE 4:00 PM  At the [facility], you and your health needs are our priority. As part of our continuing mission to provide you with exceptional heart care, we have created designated Provider Care Teams. These Care Teams include your primary Cardiologist (physician) and Advanced Practice Providers (APPs- Physician Assistants and Nurse Practitioners) who all work together to provide you with the care you need, when you need it.   You may see any of the following providers on your designated Care Team at your next follow up: . Dr Daniel Bensimhon . Dr Dalton McLean . Dr Brandon Winfrey . Amy Clegg, NP . Brittainy Simmons, PA . Jessica Milford,NP . Lauren Kemp, PharmD   Please be sure to bring in all your medications bottles to every appointment.     

## 2021-05-04 ENCOUNTER — Other Ambulatory Visit: Payer: Self-pay

## 2021-05-04 DIAGNOSIS — I5022 Chronic systolic (congestive) heart failure: Secondary | ICD-10-CM | POA: Diagnosis not present

## 2021-05-04 NOTE — Progress Notes (Signed)
Daily Session Note  Patient Details  Name: [name] MRN: [id number] Date of Birth: [birth year] Referring Provider:   Flowsheet Row Cardiac Rehab from 03/30/2021 in [facility] Cardiac and Pulmonary Rehab  Referring Provider Glori Bickers MD       Encounter Date: 05/04/2021  Check In:  Session Check In - 05/04/21 0727       Check-In   Supervising physician immediately available to respond to emergencies See telemetry face sheet for immediately available ER MD    Location [facility]-Cardiac & Pulmonary Rehab    Staff Present Birdie Sons, MPA, Nino Glow, MS, ASCM CEP, Exercise Physiologist;Joseph Tessie Fass, RCP,RRT,BSRT;Kristen [place], RN,BC,MSN    Virtual Visit No    Medication changes reported     No    Fall or balance concerns reported    No    Warm-up and Cool-down Performed on first and last piece of equipment    Resistance Training Performed Yes    VAD Patient? No    PAD/SET Patient? No      Pain Assessment   Currently in Pain? No/denies                Social History   Tobacco Use  Smoking Status Never  Smokeless Tobacco Never    Goals Met:  Independence with exercise equipment Exercise tolerated well No report of cardiac concerns or symptoms Strength training completed today  Goals Unmet:  Not Applicable  Comments: Pt able to follow exercise prescription today without complaint.  Will continue to monitor for progression.    Dr. Emily Filbert is Medical Director for [place].  Dr. Ottie Glazier is Medical Director for [facility] Pulmonary Rehabilitation.

## 2021-05-12 ENCOUNTER — Other Ambulatory Visit: Payer: Self-pay

## 2021-05-16 ENCOUNTER — Encounter: Payer: BC Managed Care – PPO | Attending: Internal Medicine | Admitting: *Deleted

## 2021-05-16 ENCOUNTER — Other Ambulatory Visit: Payer: Self-pay

## 2021-05-16 ENCOUNTER — Other Ambulatory Visit (HOSPITAL_COMMUNITY): Payer: Self-pay | Admitting: *Deleted

## 2021-05-16 ENCOUNTER — Encounter (HOSPITAL_COMMUNITY): Payer: Self-pay

## 2021-05-16 DIAGNOSIS — I5022 Chronic systolic (congestive) heart failure: Secondary | ICD-10-CM

## 2021-05-16 MED ORDER — ENTRESTO 24-26 MG PO TABS: 1.0000 | ORAL_TABLET | Freq: Two times a day (BID) | ORAL | 3 refills | Status: DC

## 2021-05-16 MED ORDER — CARVEDILOL 6.25 MG PO TABS: 6.2500 mg | ORAL_TABLET | Freq: Two times a day (BID) | ORAL | 3 refills | Status: DC

## 2021-05-16 MED ORDER — APIXABAN 5 MG PO TABS: 5.0000 mg | ORAL_TABLET | Freq: Two times a day (BID) | ORAL | 3 refills | Status: DC

## 2021-05-16 NOTE — Progress Notes (Signed)
Daily Session Note  Patient Details  Name: [name] MRN: [id number] Date of Birth: 1996-08-18 Referring Provider:   Flowsheet Row Cardiac Rehab from 03/30/2021 in [facility] Cardiac and Pulmonary Rehab  Referring Provider Glori Bickers MD       Encounter Date: 05/16/2021  Check In:  Session Check In - 05/16/21 0758       Check-In   Supervising physician immediately available to respond to emergencies See telemetry face sheet for immediately available ER MD    Location [facility]-Cardiac & Pulmonary Rehab    Staff Present Heath Lark, RN, BSN, CCRP;Kristen Coble, RN,BC,MSN;Kelly [place], [place], ACSM CEP, Exercise Physiologist    Virtual Visit No    Medication changes reported     No    Fall or balance concerns reported    No    Warm-up and Cool-down Performed on first and last piece of equipment    Resistance Training Performed Yes    VAD Patient? No    PAD/SET Patient? No      Pain Assessment   Currently in Pain? No/denies                Social History   Tobacco Use  Smoking Status Never  Smokeless Tobacco Never    Goals Met:  Independence with exercise equipment Exercise tolerated well No report of cardiac concerns or symptoms  Goals Unmet:  Not Applicable  Comments: Pt able to follow exercise prescription today without complaint.  Will continue to monitor for progression.    Dr. Emily Filbert is Medical Director for [place].  Dr. Ottie Glazier is Medical Director for [facility] Pulmonary Rehabilitation.

## 2021-05-18 ENCOUNTER — Other Ambulatory Visit: Payer: Self-pay

## 2021-05-18 ENCOUNTER — Other Ambulatory Visit: Payer: Self-pay | Admitting: Physician Assistant

## 2021-05-18 ENCOUNTER — Encounter: Payer: Self-pay | Admitting: *Deleted

## 2021-05-18 ENCOUNTER — Encounter: Payer: BC Managed Care – PPO | Admitting: *Deleted

## 2021-05-18 DIAGNOSIS — I5022 Chronic systolic (congestive) heart failure: Secondary | ICD-10-CM | POA: Diagnosis not present

## 2021-05-18 MED ORDER — POTASSIUM CHLORIDE CRYS ER 20 MEQ PO TBCR: 20.0000 meq | EXTENDED_RELEASE_TABLET | Freq: Every day | ORAL | 3 refills | Status: DC

## 2021-05-18 NOTE — Progress Notes (Signed)
Cardiac Individual Treatment Plan  Patient Details  Name: [name] MRN: [id number] Date of Birth: [birth year] Referring Provider:   Flowsheet Row Cardiac Rehab from 03/30/2021 in [facility] Cardiac and Pulmonary Rehab  Referring Provider Glori Bickers MD       Initial Encounter Date:  Flowsheet Row Cardiac Rehab from 03/30/2021 in [facility] Cardiac and Pulmonary Rehab  Date 03/30/21       Visit Diagnosis: Heart failure, chronic systolic ([place])  Patient's Home Medications on Admission:  Current Outpatient Medications:    apixaban (ELIQUIS) 5 MG TABS tablet, Take 1 tablet (5 mg total) by mouth 2 (two) times daily., Disp: 180 tablet, Rfl: 3   carvedilol (COREG) 6.25 MG tablet, Take 1 tablet (6.25 mg total) by mouth in the morning and at bedtime., Disp: 180 tablet, Rfl: 3   empagliflozin (JARDIANCE) 10 MG TABS tablet, Take 10 mg by mouth daily., Disp: , Rfl:    potassium chloride SA (KLOR-CON) 20 MEQ tablet, Take 1 tablet (20 mEq total) by mouth daily., Disp: 30 tablet, Rfl: 3   sacubitril-valsartan (ENTRESTO) 24-26 MG, Take 1 tablet by mouth 2 (two) times daily., Disp: 180 tablet, Rfl: 3   spironolactone (ALDACTONE) 25 MG tablet, Take 0.5 tablets (12.5 mg total) by mouth at bedtime., Disp: 45 tablet, Rfl: 3   TRI-ESTARYLLA 0.18/0.215/0.25 MG-35 MCG tablet, Take 1 tablet by mouth daily., Disp: , Rfl:    valACYclovir (VALTREX) 1000 MG tablet, Take 1,000 mg by mouth as needed for rash., Disp: , Rfl:   Past Medical History: Past Medical History:  Diagnosis Date   Lactose intolerance    NICM (nonischemic cardiomyopathy) (HCC)    PVC's (premature ventricular contractions)    Torsades de pointes (HCC)     Tobacco Use: Social History   Tobacco Use  Smoking Status Never  Smokeless Tobacco Never    Labs: Recent Review Flowsheet Data   There is no flowsheet data to display.      Exercise Target Goals: Exercise Program Goal: Individual exercise prescription set using  results from initial 6 min walk test and THRR while considering  patient's activity barriers and safety.   Exercise Prescription Goal: Initial exercise prescription builds to 30-45 minutes a day of aerobic activity, 2-3 days per week.  Home exercise guidelines will be given to patient during program as part of exercise prescription that the participant will acknowledge.   Education: Aerobic Exercise: - Group verbal and visual presentation on the components of exercise prescription. Introduces F.I.T.T principle from ACSM for exercise prescriptions.  Reviews F.I.T.T. principles of aerobic exercise including progression. Written material given at graduation.   Education: Resistance Exercise: - Group verbal and visual presentation on the components of exercise prescription. Introduces F.I.T.T principle from ACSM for exercise prescriptions  Reviews F.I.T.T. principles of resistance exercise including progression. Written material given at graduation. Flowsheet Row Cardiac Rehab from 05/18/2021 in [facility] Cardiac and Pulmonary Rehab  Date 05/04/21  Educator [facility]  Instruction Review Code 1- Verbalizes Understanding        Education: Exercise & Equipment Safety: - Individual verbal instruction and demonstration of equipment use and safety with use of the equipment. Flowsheet Row Cardiac Rehab from 05/18/2021 in [facility] Cardiac and Pulmonary Rehab  Date 03/30/21  Educator [facility]  Instruction Review Code 1- Verbalizes Understanding       Education: Exercise Physiology & General Exercise Guidelines: - Group verbal and written instruction with models to review the exercise physiology of the cardiovascular system and associated critical values. Provides general  exercise guidelines with specific guidelines to those with heart or lung disease.  Flowsheet Row Cardiac Rehab from 05/18/2021 in [facility] Cardiac and Pulmonary Rehab  Date 04/20/21  Educator [facility]  Instruction Review Code 1- Verbalizes Understanding        Education: Flexibility, Balance, Mind/Body Relaxation: - Group verbal and visual presentation with interactive activity on the components of exercise prescription. Introduces F.I.T.T principle from ACSM for exercise prescriptions. Reviews F.I.T.T. principles of flexibility and balance exercise training including progression. Also discusses the mind body connection.  Reviews various relaxation techniques to help reduce and manage stress (i.e. Deep breathing, progressive muscle relaxation, and visualization). Balance handout provided to take home. Written material given at graduation.   Activity Barriers & Risk Stratification:  Activity Barriers & Cardiac Risk Stratification - 03/30/21 1352       Activity Barriers & Cardiac Risk Stratification   Activity Barriers Shortness of Breath;Deconditioning    Cardiac Risk Stratification High             6 Minute Walk:  6 Minute Walk     Row Name 03/30/21 1351         6 Minute Walk   Phase Initial     Distance 1175 feet     Walk Time 6 minutes     # of Rest Breaks 0     MPH 2.22     METS 5.31     RPE 12     Perceived Dyspnea  1     VO2 Peak 18.57     Symptoms Yes (comment)     Comments SOB     Resting HR 79 bpm     Resting BP 104/62     Resting Oxygen Saturation  96 %     Exercise Oxygen Saturation  during 6 min walk 97 %     Max Ex. HR 110 bpm     Max Ex. BP 128/72     2 Minute Post BP 108/60              Oxygen Initial Assessment:   Oxygen Re-Evaluation:   Oxygen Discharge (Final Oxygen Re-Evaluation):   Initial Exercise Prescription:  Initial Exercise Prescription - 03/30/21 1300       Date of Initial Exercise RX and Referring Provider   Date 03/30/21    Referring Provider Glori Bickers MD      Treadmill   MPH 2.2    Grade 1    Minutes 15    METs 2.99      Recumbant Bike   Level 3    RPM 50    Watts 50    Minutes 15    METs 3      Recumbant Elliptical   Level 2    RPM 50    Minutes  15    METs 3      Elliptical   Level 1    Speed 4    Minutes 15    METs 3      REL-XR   Level 3    Speed 50    Minutes 15    METs 3      Prescription Details   Frequency (times per week) 3    Duration Progress to 30 minutes of continuous aerobic without signs/symptoms of physical distress      Intensity   THRR 40-80% of Max Heartrate 126-173    Ratings of Perceived Exertion 11-13    Perceived Dyspnea 0-4  Progression   Progression Continue to progress workloads to maintain intensity without signs/symptoms of physical distress.      Resistance Training   Training Prescription Yes    Weight 4 lb    Reps 10-15             Perform Capillary Blood Glucose checks as needed.  Exercise Prescription Changes:   Exercise Prescription Changes     Row Name 03/30/21 1300 04/06/21 0900 04/11/21 1100 04/26/21 1400 05/12/21 0800     Response to Exercise   Blood Pressure (Admit) 104/62 -- 98/68 102/60 102/62   Blood Pressure (Exercise) 128/72 -- 102/58 122/62 --   Blood Pressure (Exit) 108/60 -- 104/64 106/62 92/58   Heart Rate (Admit) 79 bpm -- 77 bpm 96 bpm 95 bpm   Heart Rate (Exercise) 110 bpm -- 110 bpm 126 bpm 126 bpm   Heart Rate (Exit) 87 bpm -- 92 bpm 101 bpm 94 bpm   Oxygen Saturation (Admit) 96 % -- -- -- --   Oxygen Saturation (Exercise) 97 % -- -- -- --   Rating of Perceived Exertion (Exercise) 12 -- 13 13 13    Perceived Dyspnea (Exercise) 1 -- -- -- --   Symptoms SOB -- -- none none   Comments walk test -- -- -- --   Duration -- -- Continue with 30 min of aerobic exercise without signs/symptoms of physical distress. Continue with 30 min of aerobic exercise without signs/symptoms of physical distress. Continue with 30 min of aerobic exercise without signs/symptoms of physical distress.   Intensity -- -- THRR unchanged THRR unchanged THRR unchanged     Progression   Progression -- -- -- Continue to progress workloads to maintain intensity without  signs/symptoms of physical distress. Continue to progress workloads to maintain intensity without signs/symptoms of physical distress.   Average METs -- -- -- 4.31 4.49     Resistance Training   Training Prescription -- -- Yes Yes Yes   Weight -- -- 4 lb 6 lb 6 lb   Reps -- -- 10-15 10-15 10-15     Interval Training   Interval Training -- -- -- No No     Treadmill   MPH -- -- 2.2 3 3    Grade -- -- 1 2.5 2.5   Minutes -- -- 15 15 15    METs -- -- 2.99 4.33 4.33     Elliptical   Level -- -- -- 1 1   Speed -- -- -- 4 --   Minutes -- -- -- 15 15   METs -- -- -- 4.1 3.1     REL-XR   Level -- -- 1 3 3    Minutes -- -- 15 15 15    METs -- -- -- 4.5 6.4     Home Exercise Plan   Plans to continue exercise at -- Home (comment)  walking, treadmill, staff videos -- Home (comment)  walking, treadmill, staff videos Home (comment)  walking, treadmill, staff videos   Frequency -- Add 2 additional days to program exercise sessions. -- Add 2 additional days to program exercise sessions. Add 2 additional days to program exercise sessions.   Initial Home Exercises Provided -- 04/06/21 -- 04/06/21 04/06/21            Exercise Comments:   Exercise Comments     Row Name 03/31/21 0725           Exercise Comments First full day of exercise!  Patient was oriented to gym and equipment including  functions, settings, policies, and procedures.  Patient's individual exercise prescription and treatment plan were reviewed.  All starting workloads were established based on the results of the 6 minute walk test done at initial orientation visit.  The plan for exercise progression was also introduced and progression will be customized based on patient's performance and goals.                Exercise Goals and Review:   Exercise Goals     Row Name 03/30/21 1355             Exercise Goals   Increase Physical Activity Yes       Intervention Provide advice, education, support and counseling  about physical activity/exercise needs.;Develop an individualized exercise prescription for aerobic and resistive training based on initial evaluation findings, risk stratification, comorbidities and participant's personal goals.       Expected Outcomes Short Term: Attend rehab on a regular basis to increase amount of physical activity.;Long Term: Add in home exercise to make exercise part of routine and to increase amount of physical activity.;Long Term: Exercising regularly at least 3-5 days a week.       Increase Strength and Stamina Yes       Intervention Provide advice, education, support and counseling about physical activity/exercise needs.;Develop an individualized exercise prescription for aerobic and resistive training based on initial evaluation findings, risk stratification, comorbidities and participant's personal goals.       Expected Outcomes Short Term: Increase workloads from initial exercise prescription for resistance, speed, and METs.;Short Term: Perform resistance training exercises routinely during rehab and add in resistance training at home;Long Term: Improve cardiorespiratory fitness, muscular endurance and strength as measured by increased METs and functional capacity (6MWT)       Able to understand and use rate of perceived exertion (RPE) scale Yes       Intervention Provide education and explanation on how to use RPE scale       Expected Outcomes Short Term: Able to use RPE daily in rehab to express subjective intensity level;Long Term:  Able to use RPE to guide intensity level when exercising independently       Able to understand and use Dyspnea scale Yes       Intervention Provide education and explanation on how to use Dyspnea scale       Expected Outcomes Short Term: Able to use Dyspnea scale daily in rehab to express subjective sense of shortness of breath during exertion;Long Term: Able to use Dyspnea scale to guide intensity level when exercising independently        Knowledge and understanding of Target Heart Rate Range (THRR) Yes       Intervention Provide education and explanation of THRR including how the numbers were predicted and where they are located for reference       Expected Outcomes Short Term: Able to state/look up THRR;Long Term: Able to use THRR to govern intensity when exercising independently;Short Term: Able to use daily as guideline for intensity in rehab       Able to check pulse independently Yes       Intervention Provide education and demonstration on how to check pulse in carotid and radial arteries.;Review the importance of being able to check your own pulse for safety during independent exercise       Expected Outcomes Short Term: Able to explain why pulse checking is important during independent exercise;Long Term: Able to check pulse independently and accurately  Understanding of Exercise Prescription Yes       Intervention Provide education, explanation, and written materials on patient's individual exercise prescription       Expected Outcomes Short Term: Able to explain program exercise prescription;Long Term: Able to explain home exercise prescription to exercise independently                Exercise Goals Re-Evaluation :  Exercise Goals Re-Evaluation     Row Name 03/31/21 0725 04/06/21 0915 04/11/21 1148 04/26/21 1435 05/02/21 0756     Exercise Goal Re-Evaluation   Exercise Goals Review Able to understand and use rate of perceived exertion (RPE) scale;Increase Physical Activity;Knowledge and understanding of Target Heart Rate Range (THRR);Understanding of Exercise Prescription;Increase Strength and Stamina;Able to understand and use Dyspnea scale;Able to check pulse independently Increase Physical Activity;Increase Strength and Stamina;Able to understand and use rate of perceived exertion (RPE) scale;Able to understand and use Dyspnea scale;Knowledge and understanding of Target Heart Rate Range (THRR);Able to check  pulse independently;Understanding of Exercise Prescription Increase Physical Activity;Increase Strength and Stamina Increase Physical Activity;Increase Strength and Stamina;Understanding of Exercise Prescription --   Comments Reviewed RPE and dyspnea scales, THR and program prescription with pt today.  Pt voiced understanding and was given a copy of goals to take home. Reviewed home exercise with pt today.  Pt plans to walk and use parents' treadmill at home for exercise.  Reviewed THR, pulse, RPE, sign and symptoms, pulse oximetery and when to call 911 or MD.  Also discussed weather considerations and indoor options.  Pt voiced understanding. Langley Gauss is doing well with exercise. She attends consistently.  We will monitor progress. Dalyce is doing well in rehab.  She is up to 6 lb weights and building up her stamina on the elliptical.  We will continue to monitor her progress. Sharda  has been on vacation the past week.  She walked 3.5-7 miles per day.   Expected Outcomes Short: Use RPE daily to regulate intensity. Long: Follow program prescription in THR. Short: Add in more exercise at home and walk while on vacation over weekend Long: Continue to improve stamina Short:  continue to exercise consistently Long:  improve stamina and MET level Short: Continue to increase workloads Long; Conitnue to improve on elliptical Short: continue to follow program prescription  Long:  build overall stamina    Row Name 05/12/21 0807             Exercise Goal Re-Evaluation   Exercise Goals Review Increase Physical Activity;Increase Strength and Stamina;Understanding of Exercise Prescription       Comments Annaleah is continuing to do well in rehab. She is continuing to work within her THR zone and RPE remain in appropriate range.       Expected Outcomes Short: Continue to work up tolerance on elliptical Long: Continue to increase overall MET level                Discharge Exercise Prescription  (Final Exercise Prescription Changes):  Exercise Prescription Changes - 05/12/21 0800       Response to Exercise   Blood Pressure (Admit) 102/62    Blood Pressure (Exit) 92/58    Heart Rate (Admit) 95 bpm    Heart Rate (Exercise) 126 bpm    Heart Rate (Exit) 94 bpm    Rating of Perceived Exertion (Exercise) 13    Symptoms none    Duration Continue with 30 min of aerobic exercise without signs/symptoms of physical distress.    Intensity THRR  unchanged      Progression   Progression Continue to progress workloads to maintain intensity without signs/symptoms of physical distress.    Average METs 4.49      Resistance Training   Training Prescription Yes    Weight 6 lb    Reps 10-15      Interval Training   Interval Training No      Treadmill   MPH 3    Grade 2.5    Minutes 15    METs 4.33      Elliptical   Level 1    Minutes 15    METs 3.1      REL-XR   Level 3    Minutes 15    METs 6.4      Home Exercise Plan   Plans to continue exercise at Home (comment)   walking, treadmill, staff videos   Frequency Add 2 additional days to program exercise sessions.    Initial Home Exercises Provided 04/06/21             Nutrition:  Target Goals: Understanding of nutrition guidelines, daily intake of sodium <1552m, cholesterol <2047m calories 30% from fat and 7% or less from saturated fats, daily to have 5 or more servings of fruits and vegetables.  Education: All About Nutrition: -Group instruction provided by verbal, written material, interactive activities, discussions, models, and posters to present general guidelines for heart healthy nutrition including fat, fiber, MyPlate, the role of sodium in heart healthy nutrition, utilization of the nutrition label, and utilization of this knowledge for meal planning. Follow up email sent as well. Written material given at graduation. [facility]  Date 05/18/21   [facility] Code 1- Verbalizes Understanding       Biometrics:  Pre Biometrics - 03/30/21 1355       Pre Biometrics   Height 5' 7.5" (1.715 m)    Weight 180 lb 4.8 oz (81.8 kg)    BMI (Calculated) 27.81    Single Leg Stand 30 seconds              Nutrition Therapy Plan and Nutrition Goals:  Nutrition Therapy & Goals - 04/14/21 1141       Nutrition Therapy   Diet Heart healthy, low Na    Protein (specify units) 65g    Fiber 25 grams    Whole Grain Foods 3 servings    Saturated Fats 12 max. 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 She feels she doesn't need to review any education, but would like to discuss easy ways to get back into her normal eating routine. Discussed ingredient prepping and using ingredients that could be used in a variety of ways especially as leftovers.      Intervention Plan   Intervention Prescribe, educate and counsel regarding individualized specific dietary modifications aiming towards targeted core components such as weight, hypertension, lipid management, diabetes, heart failure and other comorbidities.    Expected Outcomes Short Term Goal: Understand basic principles of dietary content, such as calories, fat, sodium, cholesterol and nutrients.;Short Term Goal: A plan has  been developed with personal nutrition goals set during dietitian appointment.;Long Term Goal: Adherence to prescribed nutrition plan.             Nutrition Assessments:  MEDIFICTS Score Key: ?70 Need to make dietary changes  40-70 Heart Healthy Diet ? 40 Therapeutic Level Cholesterol Diet  Flowsheet Row Cardiac Rehab from 03/30/2021 in [facility] Cardiac and  Pulmonary Rehab  Picture Your Plate Total Score on Admission 78      Picture Your Plate Scores: <16 Unhealthy dietary pattern with much room for improvement. 41-50 Dietary pattern unlikely to meet recommendations for good health and room for improvement. 51-60 More healthful dietary pattern, with some room for improvement.  >60 Healthy dietary pattern, although there may be some specific behaviors that could be improved.    Nutrition Goals Re-Evaluation:  Nutrition Goals Re-Evaluation     Row Name 05/02/21 0801             Goals   Comment Kalese has tried a couple bowls as suggested by [employer].  She monitors sodium       Expected Outcome ST;  check with Melissa about amount of sodium Long: maintain heart healthy diet                Nutrition Goals Discharge (Final Nutrition Goals Re-Evaluation):  Nutrition Goals Re-Evaluation - 05/02/21 0801       Goals   Comment Banesa has tried a couple bowls as suggested by [employer].  She monitors sodium    Expected Outcome ST;  check with Melissa about amount of sodium Long: maintain heart healthy diet             Psychosocial: Target Goals: Acknowledge presence or absence of significant depression and/or stress, maximize coping skills, provide positive support system. Participant is able to verbalize types and ability to use techniques and skills needed for reducing stress and depression.   Education: Stress, Anxiety, and Depression - Group verbal and visual presentation to define topics covered.  Reviews how body is impacted by stress, anxiety, and depression.  Also discusses healthy ways to reduce stress and to treat/manage anxiety and depression.  Written material given at graduation. Flowsheet Row Cardiac Rehab from 05/18/2021 in [facility] Cardiac and Pulmonary Rehab  Date 04/13/21  Educator [facility]  Instruction Review Code 1- [employer] Understanding       Education: Sleep Hygiene -Provides group verbal and written  instruction about how sleep can affect your health.  Define sleep hygiene, discuss sleep cycles and impact of sleep habits. Review good sleep hygiene tips.    Initial Review & Psychosocial Screening:  Initial Psych Review & Screening - 03/28/21 1126       Initial Review   Current issues with Current Stress Concerns   History of generalized Anxiety   Source of Stress Concerns Unable to participate in former interests or hobbies    Comments Home from school in [place] program is virtual . 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      Family Dynamics   Good Support System? Yes   DIrect family.  Mom and Dad and younger sister , boyfrient, best frient in [place], sister not living at home     Barriers   Psychosocial barriers to participate in program There are no identifiable barriers or psychosocial needs.;The patient should benefit from training in stress management and relaxation.      Screening Interventions   Interventions Encouraged to exercise    Expected Outcomes Short Term goal: Utilizing  psychosocial counselor, staff and physician to assist with identification of specific Stressors or current issues interfering with healing process. Setting desired goal for each stressor or current issue identified.;Long Term Goal: Stressors or current issues are controlled or eliminated.;Short Term goal: Identification and review with participant of any Quality of Life or Depression concerns found by scoring the questionnaire.;Long Term goal: The participant improves quality of Life and PHQ9 Scores as seen by post scores and/or verbalization of changes             Quality of Life Scores:   Quality of Life - 03/30/21 1356       Quality of Life   Select Quality of Life      Quality of Life Scores   Health/Function Pre 12.13 %    Socioeconomic Pre 25.56 %    Psych/Spiritual Pre 18.71 %    Family Pre 19.8 %    GLOBAL Pre 17.61 %            Scores of 19 and below usually  indicate a poorer quality of life in these areas.  A difference of  2-3 points is a clinically meaningful difference.  A difference of 2-3 points in the total score of the Quality of Life Index has been associated with significant improvement in overall quality of life, self-image, physical symptoms, and general health in studies assessing change in quality of life.  PHQ-9: Recent Review Flowsheet Data     Depression screen [facility] 2/9 03/30/2021   Decreased Interest 1   Down, Depressed, Hopeless 1   PHQ - 2 Score 2   Altered sleeping 0   Tired, decreased energy 1   Change in appetite 1   Feeling bad or failure about yourself  0   Trouble concentrating 0   Moving slowly or fidgety/restless 0   Suicidal thoughts 0   PHQ-9 Score 4   Difficult doing work/chores Somewhat difficult      Interpretation of Total Score  Total Score Depression Severity:  1-4 = Minimal depression, 5-9 = Mild depression, 10-14 = Moderate depression, 15-19 = Moderately severe depression, 20-27 = Severe depression   Psychosocial Evaluation and Intervention:  Psychosocial Evaluation - 03/28/21 1144       Psychosocial Evaluation & Interventions   Interventions Encouraged to exercise with the program and follow exercise prescription    Comments Lechelle has no barriers to attending the program. [facility] lives with her parents and younger suister at the [place]. She has come home for her heart failure treatment and will be in [place] for awhile. Her support system is good:  Besides her family at home she has a boyfrientd in [place] she goes to school. She has a best friend and her other sister. 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 She is seeing a counselor regarding all the health issues and how it is effecting her at the moment. She is not on any meds for depression nor anxiety. She has a history  of some generalized anxiety and was taken off the meds she was taking as it may have effected her heart rhythm. She is doing well without the meds at this moment. She is ready to get started and wants to learn all she can to become as healthy as she can to get back to her routine and previous life activities. She should do well in  the program    Expected Outcomes STG She attends all scheduled sessions, she continues to see her counselor as needed. LTG: She is able to continue to progress to return to her previous level of activities.    Continue Psychosocial Services  Follow up required by staff             Psychosocial Re-Evaluation:  Psychosocial Re-Evaluation     [place] Name 05/02/21 [phone]             Psychosocial Re-Evaluation   Current issues with Current Stress Concerns;Current Sleep Concerns       Comments Aireanna was seeing a therapist but it wasnt a good fit - she feels good and doesnt feel like its an urgent need but plans to find a new therapist. Not being able to drive is hard but she states her family is great and helps her out. She sleeps well.       Expected Outcomes Short: continue to exercise to help manage stres Long:  manage stress long term                Psychosocial Discharge (Final Psychosocial Re-Evaluation):  Psychosocial Re-Evaluation - 05/02/21 0758       Psychosocial Re-Evaluation   Current issues with Current Stress Concerns;Current Sleep Concerns    Comments Ilynn was seeing a therapist but it wasnt a good fit - she feels good and doesnt feel like its an urgent need but plans to find a new therapist. Not being able to drive is hard but she states her family is great and helps her out. She sleeps well.    Expected Outcomes Short: continue to exercise to help manage stres Long:  manage stress long term             Vocational Rehabilitation: Provide vocational rehab assistance to qualifying candidates.   Vocational Rehab Evaluation &  Intervention:  Vocational Rehab - 03/28/21 1136       Initial Vocational Rehab Evaluation & Intervention   Assessment shows need for Vocational Rehabilitation No             Education: Education Goals: Education classes will be provided on a variety of topics geared toward better understanding of heart health and risk factor modification. Participant will state understanding/return demonstration of topics presented as noted by education test scores.  Learning Barriers/Preferences:  Learning Barriers/Preferences - 03/28/21 1135       Learning Barriers/Preferences   Learning Barriers None    Learning Preferences None             General Cardiac Education Topics:  AED/CPR: - Group verbal and written instruction with the use of models to demonstrate the basic use of the AED with the basic ABC's of resuscitation.   Anatomy and Cardiac Procedures: - Group verbal and visual presentation and models provide information about basic cardiac anatomy and function. Reviews the testing methods done to diagnose heart disease and the outcomes of the test results. Describes the treatment choices: Medical Management, Angioplasty, or Coronary Bypass Surgery for treating various heart conditions including Myocardial Infarction, Angina, Valve Disease, and Cardiac Arrhythmias.  Written material given at graduation. Flowsheet Row Cardiac Rehab from 05/18/2021 in [facility] Cardiac and Pulmonary Rehab  Date 05/04/21  Educator SB  Instruction Review Code 1- Verbalizes Understanding       Medication Safety: - Group verbal and visual instruction to review commonly prescribed medications for heart and lung disease. Reviews the medication, class of the drug, and side  effects. Includes the steps to properly store meds and maintain the prescription regimen.  Written material given at graduation.   Intimacy: - Group verbal instruction through game format to discuss how heart and lung disease can affect  sexual intimacy. Written material given at graduation..   Know Your Numbers and Heart Failure: - Group verbal and visual instruction to discuss disease risk factors for cardiac and pulmonary disease and treatment options.  Reviews associated critical values for Overweight/Obesity, Hypertension, Cholesterol, and Diabetes.  Discusses basics of heart failure: signs/symptoms and treatments.  Introduces Heart Failure Zone chart for action plan for heart failure.  Written material given at graduation. Flowsheet Row Cardiac Rehab from 05/18/2021 in [facility] Cardiac and Pulmonary Rehab  Date 03/31/21  Educator SB  Instruction Review Code 1- Verbalizes Understanding       Infection Prevention: - Provides verbal and written material to individual with discussion of infection control including proper hand washing and proper equipment cleaning during exercise session. Flowsheet Row Cardiac Rehab from 05/18/2021 in [facility] Cardiac and Pulmonary Rehab  Date 03/30/21  Educator [facility]  Instruction Review Code 1- Verbalizes Understanding       Falls Prevention: - Provides verbal and written material to individual with discussion of falls prevention and safety. Flowsheet Row Cardiac Rehab from 05/18/2021 in [facility] Cardiac and Pulmonary Rehab  Date 03/28/21  Educator SB  Instruction Review Code 1- Verbalizes Understanding       Other: -Provides group and verbal instruction on various topics (see comments)   Knowledge Questionnaire Score:  Knowledge Questionnaire Score - 03/30/21 1357       Knowledge Questionnaire Score   Pre Score 26/26             Core Components/Risk Factors/Patient Goals at Admission:  Personal Goals and Risk Factors at Admission - 03/30/21 1357       Core Components/Risk Factors/Patient Goals on Admission    Weight Management Yes;Weight Loss    Intervention Weight Management: Develop a combined nutrition and exercise program designed to reach desired caloric intake, while  maintaining appropriate intake of nutrient and fiber, sodium and fats, and appropriate energy expenditure required for the weight goal.;Weight Management: Provide education and appropriate resources to help participant work on and attain dietary goals.;Weight Management/Obesity: Establish reasonable short term and long term weight goals.    Admit Weight 180 lb 4.8 oz (81.8 kg)    Goal Weight: Short Term 175 lb (79.4 kg)    Goal Weight: Long Term 175 lb (79.4 kg)    Expected Outcomes Short Term: Continue to assess and modify interventions until short term weight is achieved;Long Term: Adherence to nutrition and physical activity/exercise program aimed toward attainment of established weight goal;Weight Loss: Understanding of general recommendations for a balanced deficit meal plan, which promotes 1-2 lb weight loss per week and includes a negative energy balance of [phone] kcal/d;Understanding recommendations for meals to include 15-35% energy as protein, 25-35% energy from fat, 35-60% energy from carbohydrates, less than 231m of dietary cholesterol, 20-35 gm of total fiber daily;Understanding of distribution of calorie intake throughout the day with the consumption of 4-5 meals/snacks    Heart Failure Yes    Intervention Provide a combined exercise and nutrition program that is supplemented with education, support and counseling about heart failure. Directed toward relieving symptoms such as shortness of breath, decreased exercise tolerance, and extremity edema.    Expected Outcomes Improve functional capacity of life;Short term: Attendance in program 2-3 days a week with increased exercise capacity.  Reported lower sodium intake. Reported increased fruit and vegetable intake. Reports medication compliance.;Short term: Daily weights obtained and reported for increase. Utilizing diuretic protocols set by physician.;Long term: Adoption of self-care skills and reduction of barriers for early signs and symptoms  recognition and intervention leading to self-care maintenance.             Education:Diabetes - Individual verbal and written instruction to review signs/symptoms of diabetes, desired ranges of glucose level fasting, after meals and with exercise. Acknowledge that pre and post exercise glucose checks will be done for 3 sessions at entry of program.   Core Components/Risk Factors/Patient Goals Review:   Goals and Risk Factor Review     Row Name 05/02/21 0752             Core Components/Risk Factors/Patient Goals Review   Personal Goals Review Weight Management/Obesity;Heart Failure       Review Suzannah monitors weight daily and reports no signs or symtpoms of heat failure.  Weight has stayed steady.  She sees Dr. Haroldine Laws today and will ask about feeling fatigued and having muscle twitches after exercise.  She plans to ask Dr about these symptoms.       Expected Outcomes Short: follow up with Dr today Long: manage heart failure                Core Components/Risk Factors/Patient Goals at Discharge (Final Review):   Goals and Risk Factor Review - 05/02/21 0752       Core Components/Risk Factors/Patient Goals Review   Personal Goals Review Weight Management/Obesity;Heart Failure    Review Azaria monitors weight daily and reports no signs or symtpoms of heat failure.  Weight has stayed steady.  She sees Dr. Haroldine Laws today and will ask about feeling fatigued and having muscle twitches after exercise.  She plans to ask Dr about these symptoms.    Expected Outcomes Short: follow up with Dr today Long: manage heart failure             ITP Comments:  ITP Comments     Row Name 03/28/21 1144 03/30/21 1351 03/31/21 0724 04/20/21 0735 05/18/21 0714   ITP Comments Virtual orientation call completed today. shehas an appointment on Date: 03/30/2021 for EP eval and gym Orientation.  Documentation of diagnosis can be found in [facility] Date: 02/27/2021. Completed 6MWT and gym  orientation. Initial ITP created and sent for review to Dr. Emily Filbert, Medical Director. First full day of exercise!  Patient was oriented to gym and equipment including functions, settings, policies, and procedures.  Patient's individual exercise prescription and treatment plan were reviewed.  All starting workloads were established based on the results of the 6 minute walk test done at initial orientation visit.  The plan for exercise progression was also introduced and progression will be customized based on patient's performance and goals. 30 Day review completed. Medical Director ITP review done, changes made as directed, and signed approval by Medical Director.   new to program 30 Day review completed. Medical Director ITP review done, changes made as directed, and signed approval by Medical Director.            Comments:

## 2021-05-18 NOTE — Progress Notes (Signed)
Cardiac Individual Treatment Plan  Patient Details  Name: AOIFE BOLD MRN: [id number] Date of Birth: 01-28-1996 Referring Provider:   Flowsheet Row Cardiac Rehab from 03/30/2021 in [facility] Cardiac and Pulmonary Rehab  Referring Provider Glori Bickers MD       Initial Encounter Date:  Flowsheet Row Cardiac Rehab from 03/30/2021 in [facility] Cardiac and Pulmonary Rehab  Date 03/30/21       Visit Diagnosis: Heart failure, chronic systolic ([place])  Patient's Home Medications on Admission:  Current Outpatient Medications:    apixaban (ELIQUIS) 5 MG TABS tablet, Take 1 tablet (5 mg total) by mouth 2 (two) times daily., Disp: 180 tablet, Rfl: 3   carvedilol (COREG) 6.25 MG tablet, Take 1 tablet (6.25 mg total) by mouth in the morning and at bedtime., Disp: 180 tablet, Rfl: 3   empagliflozin (JARDIANCE) 10 MG TABS tablet, Take 10 mg by mouth daily., Disp: , Rfl:    potassium chloride SA (KLOR-CON) 20 MEQ tablet, Take 1 tablet (20 mEq total) by mouth daily., Disp: 30 tablet, Rfl: 3   sacubitril-valsartan (ENTRESTO) 24-26 MG, Take 1 tablet by mouth 2 (two) times daily., Disp: 180 tablet, Rfl: 3   spironolactone (ALDACTONE) 25 MG tablet, Take 0.5 tablets (12.5 mg total) by mouth at bedtime., Disp: 45 tablet, Rfl: 3   TRI-ESTARYLLA 0.18/0.215/0.25 MG-35 MCG tablet, Take 1 tablet by mouth daily., Disp: , Rfl:    valACYclovir (VALTREX) 1000 MG tablet, Take 1,000 mg by mouth as needed for rash., Disp: , Rfl:   Past Medical History: Past Medical History:  Diagnosis Date   Lactose intolerance    NICM (nonischemic cardiomyopathy) (HCC)    PVC's (premature ventricular contractions)    Torsades de pointes (HCC)     Tobacco Use: Social History   Tobacco Use  Smoking Status Never  Smokeless Tobacco Never    Labs: Recent Review Flowsheet Data   There is no flowsheet data to display.      Exercise Target Goals: Exercise Program Goal: Individual exercise prescription set using  results from initial 6 min walk test and THRR while considering  patient's activity barriers and safety.   Exercise Prescription Goal: Initial exercise prescription builds to 30-45 minutes a day of aerobic activity, 2-3 days per week.  Home exercise guidelines will be given to patient during program as part of exercise prescription that the participant will acknowledge.   Education: Aerobic Exercise: - Group verbal and visual presentation on the components of exercise prescription. Introduces F.I.T.T principle from ACSM for exercise prescriptions.  Reviews F.I.T.T. principles of aerobic exercise including progression. Written material given at graduation.   Education: Resistance Exercise: - Group verbal and visual presentation on the components of exercise prescription. Introduces F.I.T.T principle from ACSM for exercise prescriptions  Reviews F.I.T.T. principles of resistance exercise including progression. Written material given at graduation. Flowsheet Row Cardiac Rehab from 05/04/2021 in [facility] Cardiac and Pulmonary Rehab  Date 05/04/21  Educator [facility]  Instruction Review Code 1- Verbalizes Understanding        Education: Exercise & Equipment Safety: - Individual verbal instruction and demonstration of equipment use and safety with use of the equipment. Flowsheet Row Cardiac Rehab from 05/04/2021 in [facility] Cardiac and Pulmonary Rehab  Date 03/30/21  Educator [facility]  Instruction Review Code 1- Verbalizes Understanding       Education: Exercise Physiology & General Exercise Guidelines: - Group verbal and written instruction with models to review the exercise physiology of the cardiovascular system and associated critical values. Provides general  exercise guidelines with specific guidelines to those with heart or lung disease.  Flowsheet Row Cardiac Rehab from 05/04/2021 in [facility] Cardiac and Pulmonary Rehab  Date 04/20/21  Educator [facility]  Instruction Review Code 1- Verbalizes Understanding        Education: Flexibility, Balance, Mind/Body Relaxation: - Group verbal and visual presentation with interactive activity on the components of exercise prescription. Introduces F.I.T.T principle from ACSM for exercise prescriptions. Reviews F.I.T.T. principles of flexibility and balance exercise training including progression. Also discusses the mind body connection.  Reviews various relaxation techniques to help reduce and manage stress (i.e. Deep breathing, progressive muscle relaxation, and visualization). Balance handout provided to take home. Written material given at graduation.   Activity Barriers & Risk Stratification:  Activity Barriers & Cardiac Risk Stratification - 03/30/21 1352       Activity Barriers & Cardiac Risk Stratification   Activity Barriers Shortness of Breath;Deconditioning    Cardiac Risk Stratification High             6 Minute Walk:  6 Minute Walk     Row Name 03/30/21 1351         6 Minute Walk   Phase Initial     Distance 1175 feet     Walk Time 6 minutes     # of Rest Breaks 0     MPH 2.22     METS 5.31     RPE 12     Perceived Dyspnea  1     VO2 Peak 18.57     Symptoms Yes (comment)     Comments SOB     Resting HR 79 bpm     Resting BP 104/62     Resting Oxygen Saturation  96 %     Exercise Oxygen Saturation  during 6 min walk 97 %     Max Ex. HR 110 bpm     Max Ex. BP 128/72     2 Minute Post BP 108/60              Oxygen Initial Assessment:   Oxygen Re-Evaluation:   Oxygen Discharge (Final Oxygen Re-Evaluation):   Initial Exercise Prescription:  Initial Exercise Prescription - 03/30/21 1300       Date of Initial Exercise RX and Referring Provider   Date 03/30/21    Referring Provider Glori Bickers MD      Treadmill   MPH 2.2    Grade 1    Minutes 15    METs 2.99      Recumbant Bike   Level 3    RPM 50    Watts 50    Minutes 15    METs 3      Recumbant Elliptical   Level 2    RPM 50    Minutes  15    METs 3      Elliptical   Level 1    Speed 4    Minutes 15    METs 3      REL-XR   Level 3    Speed 50    Minutes 15    METs 3      Prescription Details   Frequency (times per week) 3    Duration Progress to 30 minutes of continuous aerobic without signs/symptoms of physical distress      Intensity   THRR 40-80% of Max Heartrate 126-173    Ratings of Perceived Exertion 11-13    Perceived Dyspnea 0-4  Progression   Progression Continue to progress workloads to maintain intensity without signs/symptoms of physical distress.      Resistance Training   Training Prescription Yes    Weight 4 lb    Reps 10-15             Perform Capillary Blood Glucose checks as needed.  Exercise Prescription Changes:   Exercise Prescription Changes     Row Name 03/30/21 1300 04/06/21 0900 04/11/21 1100 04/26/21 1400 05/12/21 0800     Response to Exercise   Blood Pressure (Admit) 104/62 -- 98/68 102/60 102/62   Blood Pressure (Exercise) 128/72 -- 102/58 122/62 --   Blood Pressure (Exit) 108/60 -- 104/64 106/62 92/58   Heart Rate (Admit) 79 bpm -- 77 bpm 96 bpm 95 bpm   Heart Rate (Exercise) 110 bpm -- 110 bpm 126 bpm 126 bpm   Heart Rate (Exit) 87 bpm -- 92 bpm 101 bpm 94 bpm   Oxygen Saturation (Admit) 96 % -- -- -- --   Oxygen Saturation (Exercise) 97 % -- -- -- --   Rating of Perceived Exertion (Exercise) 12 -- 13 13 13    Perceived Dyspnea (Exercise) 1 -- -- -- --   Symptoms SOB -- -- none none   Comments walk test -- -- -- --   Duration -- -- Continue with 30 min of aerobic exercise without signs/symptoms of physical distress. Continue with 30 min of aerobic exercise without signs/symptoms of physical distress. Continue with 30 min of aerobic exercise without signs/symptoms of physical distress.   Intensity -- -- THRR unchanged THRR unchanged THRR unchanged     Progression   Progression -- -- -- Continue to progress workloads to maintain intensity without  signs/symptoms of physical distress. Continue to progress workloads to maintain intensity without signs/symptoms of physical distress.   Average METs -- -- -- 4.31 4.49     Resistance Training   Training Prescription -- -- Yes Yes Yes   Weight -- -- 4 lb 6 lb 6 lb   Reps -- -- 10-15 10-15 10-15     Interval Training   Interval Training -- -- -- No No     Treadmill   MPH -- -- 2.2 3 3    Grade -- -- 1 2.5 2.5   Minutes -- -- 15 15 15    METs -- -- 2.99 4.33 4.33     Elliptical   Level -- -- -- 1 1   Speed -- -- -- 4 --   Minutes -- -- -- 15 15   METs -- -- -- 4.1 3.1     REL-XR   Level -- -- 1 3 3    Minutes -- -- 15 15 15    METs -- -- -- 4.5 6.4     Home Exercise Plan   Plans to continue exercise at -- Home (comment)  walking, treadmill, staff videos -- Home (comment)  walking, treadmill, staff videos Home (comment)  walking, treadmill, staff videos   Frequency -- Add 2 additional days to program exercise sessions. -- Add 2 additional days to program exercise sessions. Add 2 additional days to program exercise sessions.   Initial Home Exercises Provided -- 04/06/21 -- 04/06/21 04/06/21            Exercise Comments:   Exercise Comments     Row Name 03/31/21 0725           Exercise Comments First full day of exercise!  Patient was oriented to gym and equipment including  functions, settings, policies, and procedures.  Patient's individual exercise prescription and treatment plan were reviewed.  All starting workloads were established based on the results of the 6 minute walk test done at initial orientation visit.  The plan for exercise progression was also introduced and progression will be customized based on patient's performance and goals.                Exercise Goals and Review:   Exercise Goals     Row Name 03/30/21 1355             Exercise Goals   Increase Physical Activity Yes       Intervention Provide advice, education, support and counseling  about physical activity/exercise needs.;Develop an individualized exercise prescription for aerobic and resistive training based on initial evaluation findings, risk stratification, comorbidities and participant's personal goals.       Expected Outcomes Short Term: Attend rehab on a regular basis to increase amount of physical activity.;Long Term: Add in home exercise to make exercise part of routine and to increase amount of physical activity.;Long Term: Exercising regularly at least 3-5 days a week.       Increase Strength and Stamina Yes       Intervention Provide advice, education, support and counseling about physical activity/exercise needs.;Develop an individualized exercise prescription for aerobic and resistive training based on initial evaluation findings, risk stratification, comorbidities and participant's personal goals.       Expected Outcomes Short Term: Increase workloads from initial exercise prescription for resistance, speed, and METs.;Short Term: Perform resistance training exercises routinely during rehab and add in resistance training at home;Long Term: Improve cardiorespiratory fitness, muscular endurance and strength as measured by increased METs and functional capacity (6MWT)       Able to understand and use rate of perceived exertion (RPE) scale Yes       Intervention Provide education and explanation on how to use RPE scale       Expected Outcomes Short Term: Able to use RPE daily in rehab to express subjective intensity level;Long Term:  Able to use RPE to guide intensity level when exercising independently       Able to understand and use Dyspnea scale Yes       Intervention Provide education and explanation on how to use Dyspnea scale       Expected Outcomes Short Term: Able to use Dyspnea scale daily in rehab to express subjective sense of shortness of breath during exertion;Long Term: Able to use Dyspnea scale to guide intensity level when exercising independently        Knowledge and understanding of Target Heart Rate Range (THRR) Yes       Intervention Provide education and explanation of THRR including how the numbers were predicted and where they are located for reference       Expected Outcomes Short Term: Able to state/look up THRR;Long Term: Able to use THRR to govern intensity when exercising independently;Short Term: Able to use daily as guideline for intensity in rehab       Able to check pulse independently Yes       Intervention Provide education and demonstration on how to check pulse in carotid and radial arteries.;Review the importance of being able to check your own pulse for safety during independent exercise       Expected Outcomes Short Term: Able to explain why pulse checking is important during independent exercise;Long Term: Able to check pulse independently and accurately  Understanding of Exercise Prescription Yes       Intervention Provide education, explanation, and written materials on patient's individual exercise prescription       Expected Outcomes Short Term: Able to explain program exercise prescription;Long Term: Able to explain home exercise prescription to exercise independently                Exercise Goals Re-Evaluation :  Exercise Goals Re-Evaluation     Row Name 03/31/21 0725 04/06/21 0915 04/11/21 1148 04/26/21 1435 05/02/21 0756     Exercise Goal Re-Evaluation   Exercise Goals Review Able to understand and use rate of perceived exertion (RPE) scale;Increase Physical Activity;Knowledge and understanding of Target Heart Rate Range (THRR);Understanding of Exercise Prescription;Increase Strength and Stamina;Able to understand and use Dyspnea scale;Able to check pulse independently Increase Physical Activity;Increase Strength and Stamina;Able to understand and use rate of perceived exertion (RPE) scale;Able to understand and use Dyspnea scale;Knowledge and understanding of Target Heart Rate Range (THRR);Able to check  pulse independently;Understanding of Exercise Prescription Increase Physical Activity;Increase Strength and Stamina Increase Physical Activity;Increase Strength and Stamina;Understanding of Exercise Prescription --   Comments Reviewed RPE and dyspnea scales, THR and program prescription with pt today.  Pt voiced understanding and was given a copy of goals to take home. Reviewed home exercise with pt today.  Pt plans to walk and use parents' treadmill at home for exercise.  Reviewed THR, pulse, RPE, sign and symptoms, pulse oximetery and when to call 911 or MD.  Also discussed weather considerations and indoor options.  Pt voiced understanding. Langley Gauss is doing well with exercise. She attends consistently.  We will monitor progress. Somya is doing well in rehab.  She is up to 6 lb weights and building up her stamina on the elliptical.  We will continue to monitor her progress. Jansen  has been on vacation the past week.  She walked 3.5-7 miles per day.   Expected Outcomes Short: Use RPE daily to regulate intensity. Long: Follow program prescription in THR. Short: Add in more exercise at home and walk while on vacation over weekend Long: Continue to improve stamina Short:  continue to exercise consistently Long:  improve stamina and MET level Short: Continue to increase workloads Long; Conitnue to improve on elliptical Short: continue to follow program prescription  Long:  build overall stamina    Row Name 05/12/21 0807             Exercise Goal Re-Evaluation   Exercise Goals Review Increase Physical Activity;Increase Strength and Stamina;Understanding of Exercise Prescription       Comments Trinka is continuing to do well in rehab. She is continuing to work within her THR zone and RPE remain in appropriate range.       Expected Outcomes Short: Continue to work up tolerance on elliptical Long: Continue to increase overall MET level                Discharge Exercise Prescription  (Final Exercise Prescription Changes):  Exercise Prescription Changes - 05/12/21 0800       Response to Exercise   Blood Pressure (Admit) 102/62    Blood Pressure (Exit) 92/58    Heart Rate (Admit) 95 bpm    Heart Rate (Exercise) 126 bpm    Heart Rate (Exit) 94 bpm    Rating of Perceived Exertion (Exercise) 13    Symptoms none    Duration Continue with 30 min of aerobic exercise without signs/symptoms of physical distress.    Intensity THRR  unchanged      Progression   Progression Continue to progress workloads to maintain intensity without signs/symptoms of physical distress.    Average METs 4.49      Resistance Training   Training Prescription Yes    Weight 6 lb    Reps 10-15      Interval Training   Interval Training No      Treadmill   MPH 3    Grade 2.5    Minutes 15    METs 4.33      Elliptical   Level 1    Minutes 15    METs 3.1      REL-XR   Level 3    Minutes 15    METs 6.4      Home Exercise Plan   Plans to continue exercise at Home (comment)   walking, treadmill, staff videos   Frequency Add 2 additional days to program exercise sessions.    Initial Home Exercises Provided 04/06/21             Nutrition:  Target Goals: Understanding of nutrition guidelines, daily intake of sodium <1589m, cholesterol <2052m calories 30% from fat and 7% or less from saturated fats, daily to have 5 or more servings of fruits and vegetables.  Education: All About Nutrition: -Group instruction provided by verbal, written material, interactive activities, discussions, models, and posters to present general guidelines for heart healthy nutrition including fat, fiber, MyPlate, the role of sodium in heart healthy nutrition, utilization of the nutrition label, and utilization of this knowledge for meal planning. Follow up email sent as well. Written material given at graduation.   Biometrics:  Pre Biometrics - 03/30/21 1355       Pre Biometrics   Height 5' 7.5"  (1.715 m)    Weight 180 lb 4.8 oz (81.8 kg)    BMI (Calculated) 27.81    Single Leg Stand 30 seconds              Nutrition Therapy Plan and Nutrition Goals:  Nutrition Therapy & Goals - 04/14/21 1141       Nutrition Therapy   Diet Heart healthy, low Na    Protein (specify units) 65g    Fiber 25 grams    Whole Grain Foods 3 servings    Saturated Fats 12 max. 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 She feels she doesn't need to review any education, but would like to discuss easy ways to get back into her normal eating routine. Discussed ingredient prepping and using ingredients that could be used in a variety of ways especially as leftovers.      Intervention Plan   Intervention Prescribe, educate and counsel regarding individualized specific dietary modifications aiming towards targeted core components such as weight, hypertension, lipid management, diabetes, heart failure and other comorbidities.    Expected Outcomes Short Term Goal: Understand basic principles of dietary content, such as calories, fat, sodium, cholesterol and nutrients.;Short Term Goal: A plan has been developed with personal nutrition goals set during dietitian appointment.;Long Term Goal: Adherence to prescribed nutrition plan.  Nutrition Assessments:  MEDIFICTS Score Key: ?70 Need to make dietary changes  40-70 Heart Healthy Diet ? 40 Therapeutic Level Cholesterol Diet  Flowsheet Row Cardiac Rehab from 03/30/2021 in [facility] Cardiac and Pulmonary Rehab  Picture Your Plate Total Score on Admission 78      Picture Your Plate Scores: <51 Unhealthy dietary pattern with much room for improvement. 41-50  Dietary pattern unlikely to meet recommendations for good health and room for improvement. 51-60 More healthful dietary pattern, with some room for improvement.  >60 Healthy dietary pattern, although there may be some specific behaviors that could be improved.    Nutrition Goals Re-Evaluation:  Nutrition Goals Re-Evaluation     Row Name 05/02/21 0801             Goals   Comment Bethzy has tried a couple bowls as suggested by [employer].  She monitors sodium       Expected Outcome ST;  check with Melissa about amount of sodium Long: maintain heart healthy diet                Nutrition Goals Discharge (Final Nutrition Goals Re-Evaluation):  Nutrition Goals Re-Evaluation - 05/02/21 0801       Goals   Comment Tanique has tried a couple bowls as suggested by [employer].  She monitors sodium    Expected Outcome ST;  check with Melissa about amount of sodium Long: maintain heart healthy diet             Psychosocial: Target Goals: Acknowledge presence or absence of significant depression and/or stress, maximize coping skills, provide positive support system. Participant is able to verbalize types and ability to use techniques and skills needed for reducing stress and depression.   Education: Stress, Anxiety, and Depression - Group verbal and visual presentation to define topics covered.  Reviews how body is impacted by stress, anxiety, and depression.  Also discusses healthy ways to reduce stress and to treat/manage anxiety and depression.  Written material given at graduation. Flowsheet Row Cardiac Rehab from 05/04/2021 in [facility] Cardiac and Pulmonary Rehab  Date 04/13/21  Educator [facility]  Instruction Review Code 1- [employer] Understanding       Education: Sleep Hygiene -Provides group verbal and written instruction about how sleep can affect your health.  Define sleep hygiene, discuss sleep cycles and impact of sleep habits. Review good sleep hygiene tips.    Initial  Review & Psychosocial Screening:  Initial Psych Review & Screening - 03/28/21 1126       Initial Review   Current issues with Current Stress Concerns   History of generalized Anxiety   Source of Stress Concerns Unable to participate in former interests or hobbies    Comments Home from school in [place] program is virtual . 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      Family Dynamics   Good Support System? Yes   DIrect family.  Mom and Dad and younger sister , boyfrient, best frient in [place], sister not living at home     Barriers   Psychosocial barriers to participate in program There are no identifiable barriers or psychosocial needs.;The patient should benefit from training in stress management and relaxation.      Screening Interventions   Interventions Encouraged to exercise    Expected Outcomes Short Term goal: Utilizing psychosocial counselor, staff and physician to assist with identification of specific Stressors or current issues interfering with healing process. Setting desired goal for each stressor or current issue identified.;Long  Term Goal: Stressors or current issues are controlled or eliminated.;Short Term goal: Identification and review with participant of any Quality of Life or Depression concerns found by scoring the questionnaire.;Long Term goal: The participant improves quality of Life and PHQ9 Scores as seen by post scores and/or verbalization of changes             Quality of Life Scores:   Quality of Life - 03/30/21 1356       Quality of Life   Select Quality of Life      Quality of Life Scores   Health/Function Pre 12.13 %    Socioeconomic Pre 25.56 %    Psych/Spiritual Pre 18.71 %    Family Pre 19.8 %    GLOBAL Pre 17.61 %            Scores of 19 and below usually indicate a poorer quality of life in these areas.  A difference of  2-3 points is a clinically meaningful difference.  A difference of 2-3 points in the total score of the  Quality of Life Index has been associated with significant improvement in overall quality of life, self-image, physical symptoms, and general health in studies assessing change in quality of life.  PHQ-9: Recent Review Flowsheet Data     Depression screen [facility] 2/9 03/30/2021   Decreased Interest 1   Down, Depressed, Hopeless 1   PHQ - 2 Score 2   Altered sleeping 0   Tired, decreased energy 1   Change in appetite 1   Feeling bad or failure about yourself  0   Trouble concentrating 0   Moving slowly or fidgety/restless 0   Suicidal thoughts 0   PHQ-9 Score 4   Difficult doing work/chores Somewhat difficult      Interpretation of Total Score  Total Score Depression Severity:  1-4 = Minimal depression, 5-9 = Mild depression, 10-14 = Moderate depression, 15-19 = Moderately severe depression, 20-27 = Severe depression   Psychosocial Evaluation and Intervention:  Psychosocial Evaluation - 03/28/21 1144       Psychosocial Evaluation & Interventions   Interventions Encouraged to exercise with the program and follow exercise prescription    Comments Marissa has no barriers to attending the program. [facility] lives with her parents and younger suister at the [place]. She has come home for her heart failure treatment and will be in [place] for awhile. Her support system is good:  Besides her family at home she has a boyfrientd in [place] she goes to school. She has a best friend and her other sister. 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 She is seeing a counselor regarding all the health issues and how it is effecting her at the moment. She is not on any meds for depression nor anxiety. She has a history of some generalized anxiety and was taken off the meds she was taking as it may have effected her heart rhythm. She is doing well without the meds at this moment. She is  ready to get started and wants to learn all she can to become as healthy as she can to get back to her routine and previous life activities. She should do well in the program    Expected Outcomes STG She attends all scheduled sessions, she continues to see her counselor as needed. LTG: She is able to continue to progress  to return to her previous level of activities.    Continue Psychosocial Services  Follow up required by staff             Psychosocial Re-Evaluation:  Psychosocial Re-Evaluation     [place] Name 05/02/21 [phone]             Psychosocial Re-Evaluation   Current issues with Current Stress Concerns;Current Sleep Concerns       Comments Lylliana was seeing a therapist but it wasnt a good fit - she feels good and doesnt feel like its an urgent need but plans to find a new therapist. Not being able to drive is hard but she states her family is great and helps her out. She sleeps well.       Expected Outcomes Short: continue to exercise to help manage stres Long:  manage stress long term                Psychosocial Discharge (Final Psychosocial Re-Evaluation):  Psychosocial Re-Evaluation - 05/02/21 0758       Psychosocial Re-Evaluation   Current issues with Current Stress Concerns;Current Sleep Concerns    Comments Kymberli was seeing a therapist but it wasnt a good fit - she feels good and doesnt feel like its an urgent need but plans to find a new therapist. Not being able to drive is hard but she states her family is great and helps her out. She sleeps well.    Expected Outcomes Short: continue to exercise to help manage stres Long:  manage stress long term             Vocational Rehabilitation: Provide vocational rehab assistance to qualifying candidates.   Vocational Rehab Evaluation & Intervention:  Vocational Rehab - 03/28/21 1136       Initial Vocational Rehab Evaluation & Intervention   Assessment shows need for Vocational Rehabilitation No              Education: Education Goals: Education classes will be provided on a variety of topics geared toward better understanding of heart health and risk factor modification. Participant will state understanding/return demonstration of topics presented as noted by education test scores.  Learning Barriers/Preferences:  Learning Barriers/Preferences - 03/28/21 1135       Learning Barriers/Preferences   Learning Barriers None    Learning Preferences None             General Cardiac Education Topics:  AED/CPR: - Group verbal and written instruction with the use of models to demonstrate the basic use of the AED with the basic ABC's of resuscitation.   Anatomy and Cardiac Procedures: - Group verbal and visual presentation and models provide information about basic cardiac anatomy and function. Reviews the testing methods done to diagnose heart disease and the outcomes of the test results. Describes the treatment choices: Medical Management, Angioplasty, or Coronary Bypass Surgery for treating various heart conditions including Myocardial Infarction, Angina, Valve Disease, and Cardiac Arrhythmias.  Written material given at graduation. Flowsheet Row Cardiac Rehab from 05/04/2021 in [facility] Cardiac and Pulmonary Rehab  Date 05/04/21  Educator SB  Instruction Review Code 1- Verbalizes Understanding       Medication Safety: - Group verbal and visual instruction to review commonly prescribed medications for heart and lung disease. Reviews the medication, class of the drug, and side effects. Includes the steps to properly store meds and maintain the prescription regimen.  Written material given at graduation.   Intimacy: - Group verbal instruction through game format  to discuss how heart and lung disease can affect sexual intimacy. Written material given at graduation..   Know Your Numbers and Heart Failure: - Group verbal and visual instruction to discuss disease risk factors for  cardiac and pulmonary disease and treatment options.  Reviews associated critical values for Overweight/Obesity, Hypertension, Cholesterol, and Diabetes.  Discusses basics of heart failure: signs/symptoms and treatments.  Introduces Heart Failure Zone chart for action plan for heart failure.  Written material given at graduation. Flowsheet Row Cardiac Rehab from 05/04/2021 in [facility] Cardiac and Pulmonary Rehab  Date 03/31/21  Educator SB  Instruction Review Code 1- Verbalizes Understanding       Infection Prevention: - Provides verbal and written material to individual with discussion of infection control including proper hand washing and proper equipment cleaning during exercise session. Flowsheet Row Cardiac Rehab from 05/04/2021 in [facility] Cardiac and Pulmonary Rehab  Date 03/30/21  Educator [facility]  Instruction Review Code 1- Verbalizes Understanding       Falls Prevention: - Provides verbal and written material to individual with discussion of falls prevention and safety. Flowsheet Row Cardiac Rehab from 05/04/2021 in [facility] Cardiac and Pulmonary Rehab  Date 03/28/21  Educator SB  Instruction Review Code 1- Verbalizes Understanding       Other: -Provides group and verbal instruction on various topics (see comments)   Knowledge Questionnaire Score:  Knowledge Questionnaire Score - 03/30/21 1357       Knowledge Questionnaire Score   Pre Score 26/26             Core Components/Risk Factors/Patient Goals at Admission:  Personal Goals and Risk Factors at Admission - 03/30/21 1357       Core Components/Risk Factors/Patient Goals on Admission    Weight Management Yes;Weight Loss    Intervention Weight Management: Develop a combined nutrition and exercise program designed to reach desired caloric intake, while maintaining appropriate intake of nutrient and fiber, sodium and fats, and appropriate energy expenditure required for the weight goal.;Weight Management: Provide education  and appropriate resources to help participant work on and attain dietary goals.;Weight Management/Obesity: Establish reasonable short term and long term weight goals.    Admit Weight 180 lb 4.8 oz (81.8 kg)    Goal Weight: Short Term 175 lb (79.4 kg)    Goal Weight: Long Term 175 lb (79.4 kg)    Expected Outcomes Short Term: Continue to assess and modify interventions until short term weight is achieved;Long Term: Adherence to nutrition and physical activity/exercise program aimed toward attainment of established weight goal;Weight Loss: Understanding of general recommendations for a balanced deficit meal plan, which promotes 1-2 lb weight loss per week and includes a negative energy balance of [phone] kcal/d;Understanding recommendations for meals to include 15-35% energy as protein, 25-35% energy from fat, 35-60% energy from carbohydrates, less than 290m of dietary cholesterol, 20-35 gm of total fiber daily;Understanding of distribution of calorie intake throughout the day with the consumption of 4-5 meals/snacks    Heart Failure Yes    Intervention Provide a combined exercise and nutrition program that is supplemented with education, support and counseling about heart failure. Directed toward relieving symptoms such as shortness of breath, decreased exercise tolerance, and extremity edema.    Expected Outcomes Improve functional capacity of life;Short term: Attendance in program 2-3 days a week with increased exercise capacity. Reported lower sodium intake. Reported increased fruit and vegetable intake. Reports medication compliance.;Short term: Daily weights obtained and reported for increase. Utilizing diuretic protocols set by physician.;Long term: Adoption  of self-care skills and reduction of barriers for early signs and symptoms recognition and intervention leading to self-care maintenance.             Education:Diabetes - Individual verbal and written instruction to review signs/symptoms  of diabetes, desired ranges of glucose level fasting, after meals and with exercise. Acknowledge that pre and post exercise glucose checks will be done for 3 sessions at entry of program.   Core Components/Risk Factors/Patient Goals Review:   Goals and Risk Factor Review     Row Name 05/02/21 0752             Core Components/Risk Factors/Patient Goals Review   Personal Goals Review Weight Management/Obesity;Heart Failure       Review Jameshia monitors weight daily and reports no signs or symtpoms of heat failure.  Weight has stayed steady.  She sees Dr. Haroldine Laws today and will ask about feeling fatigued and having muscle twitches after exercise.  She plans to ask Dr about these symptoms.       Expected Outcomes Short: follow up with Dr today Long: manage heart failure                Core Components/Risk Factors/Patient Goals at Discharge (Final Review):   Goals and Risk Factor Review - 05/02/21 0752       Core Components/Risk Factors/Patient Goals Review   Personal Goals Review Weight Management/Obesity;Heart Failure    Review Avrianna monitors weight daily and reports no signs or symtpoms of heat failure.  Weight has stayed steady.  She sees Dr. Haroldine Laws today and will ask about feeling fatigued and having muscle twitches after exercise.  She plans to ask Dr about these symptoms.    Expected Outcomes Short: follow up with Dr today Long: manage heart failure             ITP Comments:  ITP Comments     Row Name 03/28/21 1144 03/30/21 1351 03/31/21 0724 04/20/21 0735 05/18/21 0714   ITP Comments Virtual orientation call completed today. shehas an appointment on Date: 03/30/2021 for EP eval and gym Orientation.  Documentation of diagnosis can be found in [facility] Date: 02/27/2021. Completed 6MWT and gym orientation. Initial ITP created and sent for review to Dr. Emily Filbert, Medical Director. First full day of exercise!  Patient was oriented to gym and equipment including  functions, settings, policies, and procedures.  Patient's individual exercise prescription and treatment plan were reviewed.  All starting workloads were established based on the results of the 6 minute walk test done at initial orientation visit.  The plan for exercise progression was also introduced and progression will be customized based on patient's performance and goals. 30 Day review completed. Medical Director ITP review done, changes made as directed, and signed approval by Medical Director.   new to program 30 Day review completed. Medical Director ITP review done, changes made as directed, and signed approval by Medical Director.            Comments:

## 2021-05-20 ENCOUNTER — Encounter: Payer: BC Managed Care – PPO | Admitting: *Deleted

## 2021-05-20 ENCOUNTER — Other Ambulatory Visit: Payer: Self-pay

## 2021-05-20 DIAGNOSIS — I5022 Chronic systolic (congestive) heart failure: Secondary | ICD-10-CM

## 2021-05-20 NOTE — Progress Notes (Signed)
Daily Session Note  Patient Details  Name: [name] MRN: [id number] Date of Birth: October 29, 1996 Referring Provider:   Flowsheet Row Cardiac Rehab from 03/30/2021 in [facility] Cardiac and Pulmonary Rehab  Referring Provider Glori Bickers MD       Encounter Date: 05/20/2021  Check In:  Session Check In - 05/20/21 0820       Check-In   Supervising physician immediately available to respond to emergencies See telemetry face sheet for immediately available ER MD    Location [facility]-Cardiac & Pulmonary Rehab    Staff Present Heath Lark, RN, BSN, CCRP;Joseph [place], RCP,RRT,BSRT;Melissa [place], [place], LDN    Virtual Visit No    Medication changes reported     No    Fall or balance concerns reported    No    Warm-up and Cool-down Performed on first and last piece of equipment    Resistance Training Performed Yes    VAD Patient? No    PAD/SET Patient? No      Pain Assessment   Currently in Pain? No/denies                Social History   Tobacco Use  Smoking Status Never  Smokeless Tobacco Never    Goals Met:  Independence with exercise equipment Exercise tolerated well No report of cardiac concerns or symptoms  Goals Unmet:  Not Applicable  Comments: Pt able to follow exercise prescription today without complaint.  Will continue to monitor for progression.    Dr. Emily Filbert is Medical Director for [facility].  Dr. Ottie Glazier is Medical Director for [facility] Pulmonary Rehabilitation.

## 2021-05-23 ENCOUNTER — Ambulatory Visit (INDEPENDENT_AMBULATORY_CARE_PROVIDER_SITE_OTHER): Payer: BC Managed Care – PPO

## 2021-05-23 ENCOUNTER — Encounter: Payer: BC Managed Care – PPO | Admitting: *Deleted

## 2021-05-23 ENCOUNTER — Other Ambulatory Visit: Payer: Self-pay

## 2021-05-23 DIAGNOSIS — I5022 Chronic systolic (congestive) heart failure: Secondary | ICD-10-CM

## 2021-05-23 DIAGNOSIS — I428 Other cardiomyopathies: Secondary | ICD-10-CM | POA: Diagnosis not present

## 2021-05-23 NOTE — Progress Notes (Signed)
Daily Session Note  Patient Details  Name: [name] MRN: [id number] Date of Birth: 1996-04-23 Referring Provider:   Flowsheet Row Cardiac Rehab from 03/30/2021 in [facility] Cardiac and Pulmonary Rehab  Referring Provider Glori Bickers MD       Encounter Date: 05/23/2021  Check In:  Session Check In - 05/23/21 0925       Check-In   Supervising physician immediately available to respond to emergencies See telemetry face sheet for immediately available ER MD    Location [facility]-Cardiac & Pulmonary Rehab    Staff Present Heath Lark, RN, BSN, Laveda Norman, BS, ACSM CEP, Exercise Physiologist;Joseph [place], [place]    Virtual Visit No    Medication changes reported     No    Fall or balance concerns reported    No    Warm-up and Cool-down Performed on first and last piece of equipment    Resistance Training Performed Yes    VAD Patient? No    PAD/SET Patient? No      Pain Assessment   Currently in Pain? No/denies                Social History   Tobacco Use  Smoking Status Never  Smokeless Tobacco Never    Goals Met:  Independence with exercise equipment Exercise tolerated well No report of cardiac concerns or symptoms  Goals Unmet:  Not Applicable  Comments: Pt able to follow exercise prescription today without complaint.  Will continue to monitor for progression.    Dr. Emily Filbert is Medical Director for [place].  Dr. Ottie Glazier is Medical Director for [facility] Pulmonary Rehabilitation.

## 2021-05-24 LAB — CUP PACEART REMOTE DEVICE CHECK
Battery Remaining Longevity: 150 mo
Battery Remaining Percentage: 100 %
Brady Statistic RA Percent Paced: 65 %
Brady Statistic RV Percent Paced: 0 %
Date Time Interrogation Session: 20220718012100
HighPow Impedance: 77 Ohm
Implantable Lead Implant Date: 20220415
Implantable Lead Implant Date: 20220415
Implantable Lead Location: 753859
Implantable Lead Location: 753860
Implantable Lead Model: 672
Implantable Lead Model: 7841
Implantable Lead Serial Number: 1134026
Implantable Lead Serial Number: 165867
Implantable Pulse Generator Implant Date: 20220415
Lead Channel Impedance Value: 540 Ohm
Lead Channel Impedance Value: 723 Ohm
Lead Channel Setting Pacing Amplitude: 2.5 V
Lead Channel Setting Pacing Amplitude: 2.5 V
Lead Channel Setting Pacing Pulse Width: 0.4 ms
Lead Channel Setting Sensing Sensitivity: 0.5 mV
Pulse Gen Serial Number: 597305

## 2021-05-25 ENCOUNTER — Other Ambulatory Visit: Payer: Self-pay

## 2021-05-25 DIAGNOSIS — I5022 Chronic systolic (congestive) heart failure: Secondary | ICD-10-CM

## 2021-05-25 NOTE — Progress Notes (Signed)
Daily Session Note  Patient Details  Name: [name] MRN: [id number] Date of Birth: 27-Mar-1996 Referring Provider:   Flowsheet Row Cardiac Rehab from 03/30/2021 in [facility] Cardiac and Pulmonary Rehab  Referring Provider Glori Bickers MD       Encounter Date: 05/25/2021  Check In:  Session Check In - 05/25/21 0719       Check-In   Supervising physician immediately available to respond to emergencies See telemetry face sheet for immediately available ER MD    Location [facility]-Cardiac & Pulmonary Rehab    Staff Present Birdie Sons, MPA, Elveria Rising, BA, ACSM CEP, Exercise Physiologist;Joseph Tessie Fass, [place]    Virtual Visit No    Medication changes reported     No    Fall or balance concerns reported    No    Warm-up and Cool-down Performed on first and last piece of equipment    Resistance Training Performed Yes    VAD Patient? No    PAD/SET Patient? No      Pain Assessment   Currently in Pain? No/denies                Social History   Tobacco Use  Smoking Status Never  Smokeless Tobacco Never    Goals Met:  Independence with exercise equipment Exercise tolerated well No report of cardiac concerns or symptoms Strength training completed today  Goals Unmet:  Not Applicable  Comments: Pt able to follow exercise prescription today without complaint.  Will continue to monitor for progression.    Dr. Emily Filbert is Medical Director for [place].  Dr. Ottie Glazier is Medical Director for [facility] Pulmonary Rehabilitation.

## 2021-05-27 ENCOUNTER — Encounter: Payer: BC Managed Care – PPO | Admitting: *Deleted

## 2021-05-27 ENCOUNTER — Other Ambulatory Visit: Payer: Self-pay

## 2021-05-27 ENCOUNTER — Encounter (HOSPITAL_COMMUNITY): Payer: Self-pay

## 2021-05-27 DIAGNOSIS — I5022 Chronic systolic (congestive) heart failure: Secondary | ICD-10-CM | POA: Diagnosis not present

## 2021-05-27 NOTE — Progress Notes (Signed)
Daily Session Note  Patient Details  Name: [name] MRN: [id number] Date of Birth: Jun 29, 1996 Referring Provider:   Flowsheet Row Cardiac Rehab from 03/30/2021 in [facility] Cardiac and Pulmonary Rehab  Referring Provider Glori Bickers MD       Encounter Date: 05/27/2021  Check In:  Session Check In - 05/27/21 0735       Check-In   Supervising physician immediately available to respond to emergencies See telemetry face sheet for immediately available ER MD    Location [facility]-Cardiac & Pulmonary Rehab    Staff Present Heath Lark, RN, BSN, CCRP;Jessica Nibley, MA, RCEP, CCRP, CCET;Joseph [place], [place]    Virtual Visit No    Medication changes reported     No    Fall or balance concerns reported    No    Warm-up and Cool-down Performed on first and last piece of equipment    Resistance Training Performed Yes    VAD Patient? No    PAD/SET Patient? No      Pain Assessment   Currently in Pain? No/denies                Social History   Tobacco Use  Smoking Status Never  Smokeless Tobacco Never    Goals Met:  Independence with exercise equipment Exercise tolerated well No report of cardiac concerns or symptoms  Goals Unmet:  Not Applicable  Comments: Pt able to follow exercise prescription today without complaint.  Will continue to monitor for progression. See EX Phys note   Dr. Emily Filbert is Medical Director for [place].  Dr. Ottie Glazier is Medical Director for [facility] Pulmonary Rehabilitation.

## 2021-05-27 NOTE — Progress Notes (Signed)
Miguel had a rough day yesterday.  She forgot to take her morning meds and when she realized it, it triggered her anxiety.  As such, she had chest pain and dizzy spells all day yesterday. Her pressures were normal at the end of the day. She took morning meds at noon and night time about an hour later than normal.  Today, she is still having some dizziness, but better than yesterday and no chest pain.

## 2021-06-01 ENCOUNTER — Other Ambulatory Visit: Payer: Self-pay

## 2021-06-01 DIAGNOSIS — I5022 Chronic systolic (congestive) heart failure: Secondary | ICD-10-CM

## 2021-06-01 NOTE — Progress Notes (Signed)
Daily Session Note  Patient Details  Name: [name] MRN: [id number] Date of Birth: 1996-09-09 Referring Provider:   Flowsheet Row Cardiac Rehab from 03/30/2021 in [facility] Cardiac and Pulmonary Rehab  Referring Provider Glori Bickers MD       Encounter Date: 06/01/2021  Check In:  Session Check In - 06/01/21 0721       Check-In   Supervising physician immediately available to respond to emergencies See telemetry face sheet for immediately available ER MD    Location [facility]-Cardiac & Pulmonary Rehab    Staff Present Birdie Sons, MPA, Elveria Rising, BA, ACSM CEP, Exercise Physiologist;Joseph Tessie Fass, [place]    Virtual Visit No    Medication changes reported     No    Fall or balance concerns reported    No    Warm-up and Cool-down Performed on first and last piece of equipment    Resistance Training Performed Yes    VAD Patient? No    PAD/SET Patient? No      Pain Assessment   Currently in Pain? No/denies                Social History   Tobacco Use  Smoking Status Never  Smokeless Tobacco Never    Goals Met:  Independence with exercise equipment Exercise tolerated well No report of cardiac concerns or symptoms Strength training completed today  Goals Unmet:  Not Applicable  Comments: Pt able to follow exercise prescription today without complaint.  Will continue to monitor for progression.    Dr. Emily Filbert is Medical Director for [place].  Dr. Ottie Glazier is Medical Director for [facility] Pulmonary Rehabilitation.

## 2021-06-02 NOTE — Progress Notes (Signed)
Electrophysiology Office Follow up Visit Note:    Date:  06/03/2021   ID:  [name], DOB January 12, 1996, MRN [id number]  PCP:  Patient, [name]  [facility] Cardiologist:  Donato Heinz, MD  [facility] Electrophysiologist:  Vickie Epley, MD    Interval History:    [name] is a 25 y.o. female who presents for a follow up visit after ICD implant 02/18/2021. Since implant she has done well without ICD therapies being delivered. She still has the LRL at 80bpm.  She has an appointment on 06/14/2021 to discuss genetic testing results.  She is here today with her mother who I have previously met.   Past Medical History:  Diagnosis Date   Lactose intolerance    NICM (nonischemic cardiomyopathy) ([place])    PVC's (premature ventricular contractions)    Torsades de pointes [facility])     Past Surgical History:  Procedure Laterality Date   ICD IMPLANT N/A 02/18/2021   Procedure: ICD IMPLANT;  Surgeon: Vickie Epley, MD;  Location: [facility];  Service: Cardiovascular;  Laterality: N/A;   ICD IMPLANT Left 02/18/2021    Current Medications: Current Meds  Medication Sig   apixaban (ELIQUIS) 5 MG TABS tablet Take 1 tablet (5 mg total) by mouth 2 (two) times daily.   carvedilol (COREG) 6.25 MG tablet Take 1 tablet (6.25 mg total) by mouth in the morning and at bedtime.   empagliflozin (JARDIANCE) 10 MG TABS tablet Take 10 mg by mouth daily.   potassium chloride SA (KLOR-CON) 20 MEQ tablet Take 1 tablet (20 mEq total) by mouth daily.   sacubitril-valsartan (ENTRESTO) 24-26 MG Take 1 tablet by mouth 2 (two) times daily.   sertraline (ZOLOFT) 25 MG tablet Take 25 mg by mouth daily.   spironolactone (ALDACTONE) 25 MG tablet Take 0.5 tablets (12.5 mg total) by mouth at bedtime.   TRI-ESTARYLLA 0.18/0.215/0.25 MG-35 MCG tablet Take 1 tablet by mouth daily.   valACYclovir (VALTREX) 1000 MG tablet Take 1,000 mg by mouth as needed for rash.      Allergies:   Patient has no known allergies.   Social History   Socioeconomic History   Marital status: Single    Spouse name: Not on file   Number of children: Not on file   Years of education: Not on file   Highest education level: Not on file  Occupational History   Not on file  Tobacco Use   Smoking status: Never   Smokeless tobacco: Never  Substance and Sexual Activity   Alcohol use: No   Drug use: No   Sexual activity: Never  Other Topics Concern   Not on file  Social History Narrative   [employer] highschool   10th grade.   volleyball   Social Determinants of Radio broadcast assistant Strain: Not on file  Food Insecurity: Not on file  Transportation Needs: Not on file  Physical Activity: Not on file  Stress: Not on file  Social Connections: Not on file     Family History: The patient's family history includes Healthy in her mother, sister, and sister; Heart attack in her maternal grandmother; Hyperlipidemia in her father and maternal grandfather.  ROS:   Please see the history of present illness.    All other systems reviewed and are negative.  EKGs/Labs/Other Studies Reviewed:    The following studies were reviewed today:  06/03/2021 in clinic device interrogation personally reviewed Lead parameters stable Battery longevity 12.5  years No ICD therapies delivered LRL decreased today from 80 to 70 during today's visit  EKG:  The ekg ordered today demonstrates sinus rhythm.   Recent Labs: 02/18/2021: Hemoglobin 13.6; Platelets 293 05/02/2021: ALT 16; B Natriuretic Peptide 100.4; BUN 10; Creatinine, Ser 0.72; Magnesium 2.2; Potassium 3.8; Sodium 139  Recent Lipid Panel No results found for: CHOL, TRIG, HDL, CHOLHDL, VLDL, LDLCALC, LDLDIRECT  Physical Exam:    VS:  BP 114/72   Pulse 95   Ht 5' 6"  (1.676 m)   Wt 182 lb (82.6 kg)   SpO2 99%   BMI 29.38 kg/m     Wt Readings from Last 3 Encounters:  06/03/21 182 lb (82.6 kg)  05/02/21 183 lb  6.4 oz (83.2 kg)  03/30/21 180 lb 4.8 oz (81.8 kg)     GEN:  Well nourished, well developed in no acute distress HEENT: Normal NECK: No JVD; No carotid bruits LYMPHATICS: No lymphadenopathy CARDIAC: RRR, no murmurs, rubs, gallops RESPIRATORY:  Clear to auscultation without rales, wheezing or rhonchi  ABDOMEN: Soft, non-tender, non-distended MUSCULOSKELETAL:  No edema; No deformity  SKIN: Warm and dry NEUROLOGIC:  Alert and oriented x 3 PSYCHIATRIC:  Normal affect   ASSESSMENT:    1. Chronic systolic heart failure ([place])   2. NICM (nonischemic cardiomyopathy) ([place])   3. Torsades de pointes ([place])   4. Acute deep vein thrombosis (DVT) of left upper extremity, unspecified vein (HCC)   5. ICD (implantable cardioverter-defibrillator) in place    PLAN:    In order of problems listed above:  1. NICM (nonischemic cardiomyopathy) ([place]) 2. Chronic systolic heart failure ([place]) 3. ICD in situ 4. Torsades de pointes (HCC)  NYHA II. Warm and dry today. Doing well with medical therapy. Continue medicaly therapy with coreg, jardiance, entresto, aldactone.  ICD functioning well. Continue remote interrogations. Lowered the base rate to 70 bpm today. Will plan on seeing her back in about 6 months. If remains electrically quiescent in the interim will plan to decrease base rate to 60 bpm at that appointment.  4. Acute deep vein thrombosis (DVT) of left upper extremity, unspecified vein (HCC) On eliquis for DVT in LUE. Symptoms are slowly improving.        Total time spent with patient today 45 minutes. This includes reviewing records, evaluating the patient and coordinating care.   Medication Adjustments/Labs and Tests Ordered: Current medicines are reviewed at length with the patient today.  Concerns regarding medicines are outlined above.  No orders of the defined types were placed in this encounter.  No orders of the defined types were placed in this  encounter.    Signed, Lars Mage, MD, [facility], [facility] 06/03/2021 7:16 PM    Electrophysiology [facility]

## 2021-06-03 ENCOUNTER — Other Ambulatory Visit: Payer: Self-pay

## 2021-06-03 ENCOUNTER — Encounter: Payer: Self-pay | Admitting: Cardiology

## 2021-06-03 ENCOUNTER — Encounter: Payer: BC Managed Care – PPO | Admitting: *Deleted

## 2021-06-03 ENCOUNTER — Ambulatory Visit (INDEPENDENT_AMBULATORY_CARE_PROVIDER_SITE_OTHER): Payer: BC Managed Care – PPO | Admitting: Cardiology

## 2021-06-03 VITALS — BP 114/72 | HR 95 | Ht 66.0 in | Wt 182.0 lb

## 2021-06-03 DIAGNOSIS — I5022 Chronic systolic (congestive) heart failure: Secondary | ICD-10-CM

## 2021-06-03 DIAGNOSIS — I472 Ventricular tachycardia: Secondary | ICD-10-CM

## 2021-06-03 DIAGNOSIS — I428 Other cardiomyopathies: Secondary | ICD-10-CM | POA: Diagnosis not present

## 2021-06-03 DIAGNOSIS — I82622 Acute embolism and thrombosis of deep veins of left upper extremity: Secondary | ICD-10-CM

## 2021-06-03 DIAGNOSIS — I4721 Torsades de pointes: Secondary | ICD-10-CM

## 2021-06-03 DIAGNOSIS — Z95810 Presence of automatic (implantable) cardiac defibrillator: Secondary | ICD-10-CM

## 2021-06-03 NOTE — Patient Instructions (Addendum)
Medication Instructions:  Your physician recommends that you continue on your current medications as directed. Please refer to the Current Medication list given to you today. *If you need a refill on your cardiac medications before your next appointment, please call your pharmacy*  Lab Work: None ordered. If you have labs (blood work) drawn today and your tests are completely normal, you will receive your results only by: MyChart Message (if you have MyChart) OR A paper copy in the mail If you have any lab test that is abnormal or we need to change your treatment, we will call you to review the results.  Testing/Procedures: None ordered.  Follow-Up: At [facility], you and your health needs are our priority.  As part of our continuing mission to provide you with exceptional heart care, we have created designated Provider Care Teams.  These Care Teams include your primary Cardiologist (physician) and Advanced Practice Providers (APPs -  Physician Assistants and Nurse Practitioners) who all work together to provide you with the care you need, when you need it.  Your next appointment:   Your physician wants you to follow-up in: December 2022 with Lanier Prude, MD   Remote monitoring is used to monitor your ICD from home. This monitoring reduces the number of office visits required to check your device to one time per year. It allows [place] to keep an eye on the functioning of your device to ensure it is working properly. You are scheduled for a device check from home on 08/22/2021. You may send your transmission at any time that day. If you have a wireless device, the transmission will be sent automatically. After your physician reviews your transmission, you will receive a postcard with your next transmission date.

## 2021-06-03 NOTE — Progress Notes (Signed)
Daily Session Note  Patient Details  Name: [name] MRN: [id number] Date of Birth: 07/07/1996 Referring Provider:   Flowsheet Row Cardiac Rehab from 03/30/2021 in [facility] Cardiac and Pulmonary Rehab  Referring Provider Glori Bickers MD       Encounter Date: 06/03/2021  Check In:  Session Check In - 06/03/21 0726       Check-In   Supervising physician immediately available to respond to emergencies See telemetry face sheet for immediately available ER MD    Location [facility]-Cardiac & Pulmonary Rehab    Staff Present Justin Mend, RCP,RRT,BSRT;Amanda Oletta Darter, BA, ACSM CEP, Exercise Physiologist;Shizuye Rupert, RN, BSN, CCRP    Virtual Visit No    Medication changes reported     No    Fall or balance concerns reported    No    Warm-up and Cool-down Performed on first and last piece of equipment    Resistance Training Performed Yes    VAD Patient? No    PAD/SET Patient? No      Pain Assessment   Currently in Pain? No/denies                Social History   Tobacco Use  Smoking Status Never  Smokeless Tobacco Never    Goals Met:  Independence with exercise equipment Exercise tolerated well No report of cardiac concerns or symptoms  Goals Unmet:  Not Applicable  Comments: Pt able to follow exercise prescription today without complaint.  Will continue to monitor for progression.    Dr. Emily Filbert is Medical Director for [place].  Dr. Ottie Glazier is Medical Director for [facility] Pulmonary Rehabilitation.

## 2021-06-08 ENCOUNTER — Other Ambulatory Visit: Payer: Self-pay

## 2021-06-08 ENCOUNTER — Encounter: Payer: BC Managed Care – PPO | Attending: Internal Medicine

## 2021-06-08 DIAGNOSIS — I428 Other cardiomyopathies: Secondary | ICD-10-CM | POA: Diagnosis not present

## 2021-06-08 DIAGNOSIS — I5022 Chronic systolic (congestive) heart failure: Secondary | ICD-10-CM

## 2021-06-08 NOTE — Progress Notes (Signed)
Daily Session Note  Patient Details  Name: [name] MRN: [id number] Date of Birth: 04-01-1996 Referring Provider:   Flowsheet Row Cardiac Rehab from 03/30/2021 in [facility] Cardiac and Pulmonary Rehab  Referring Provider Mary Bickers MD       Encounter Date: 06/08/2021  Check In:  Session Check In - 06/08/21 0806       Check-In   Supervising physician immediately available to respond to emergencies See telemetry face sheet for immediately available ER MD    Location [facility]-Cardiac & Pulmonary Rehab    Staff Present Hope Budds, RDN, LDN;Hanako Tipping, RN,BC,MSN;Amanda Oletta Darter, BA, ACSM CEP, Exercise Physiologist    Virtual Visit No    Medication changes reported     Yes    Comments started on 60m of Sertraline    Fall or balance concerns reported    No    Warm-up and Cool-down Performed on first and last piece of equipment    Resistance Training Performed Yes    VAD Patient? No    PAD/SET Patient? No      Pain Assessment   Currently in Pain? No/denies    Multiple Pain Sites No                Social History   Tobacco Use  Smoking Status Never  Smokeless Tobacco Never    Goals Met:  Independence with exercise equipment Exercise tolerated well No report of cardiac concerns or symptoms  Goals Unmet:  Not Applicable  Comments: Pt able to follow exercise prescription today without complaint.  Will continue to monitor for progression.    Dr. MEmily Filbertis Medical Director for [place]  Dr. FOttie Glazieris Medical Director [facility].

## 2021-06-10 ENCOUNTER — Telehealth (HOSPITAL_COMMUNITY): Payer: Self-pay | Admitting: *Deleted

## 2021-06-10 ENCOUNTER — Encounter: Payer: BC Managed Care – PPO | Admitting: *Deleted

## 2021-06-10 ENCOUNTER — Other Ambulatory Visit: Payer: Self-pay

## 2021-06-10 VITALS — Ht 67.5 in | Wt 183.5 lb

## 2021-06-10 DIAGNOSIS — I5022 Chronic systolic (congestive) heart failure: Secondary | ICD-10-CM

## 2021-06-10 NOTE — Addendum Note (Signed)
Addended by: Michaelle Copas on: 06/10/2021 01:01 PM   Modules accepted: Orders

## 2021-06-10 NOTE — Telephone Encounter (Signed)
Pt left VM stating she needs to get hep v vaccine for school because her titer was low. Pt asked if it is ok for her to take vaccine.   Routed to Dr.Bensimhon for advice

## 2021-06-10 NOTE — Telephone Encounter (Signed)
Pt aware and thanked me for the call.  

## 2021-06-10 NOTE — Progress Notes (Signed)
Daily Session Note  Patient Details  Name: [name] MRN: [id number] Date of Birth: 1995/12/27 Referring Provider:   Flowsheet Row Cardiac Rehab from 03/30/2021 in [facility] Cardiac and Pulmonary Rehab  Referring Provider Glori Bickers MD       Encounter Date: 06/10/2021  Check In:  Session Check In - 06/10/21 0806       Check-In   Supervising physician immediately available to respond to emergencies See telemetry face sheet for immediately available ER MD    Location [facility]-Cardiac & Pulmonary Rehab    Staff Present Heath Lark, RN, BSN, CCRP;Jessica Leando, MA, RCEP, CCRP, CCET;Joseph [place], [place]    Virtual Visit No    Medication changes reported     No    Fall or balance concerns reported    No    Warm-up and Cool-down Performed on first and last piece of equipment    Resistance Training Performed Yes    VAD Patient? No    PAD/SET Patient? No      Pain Assessment   Currently in Pain? No/denies              6 Minute Walk     Row Name 03/30/21 1351 06/10/21 0736       6 Minute Walk   Phase Initial Discharge    Distance 1175 feet 1720 feet    Distance % Change -- 46.4 %    Distance Feet Change -- 545 ft    Walk Time 6 minutes 6 minutes    # of Rest Breaks 0 0    MPH 2.22 3.26    METS 5.31 6.61    RPE 12 13    Perceived Dyspnea  1 1    VO2 Peak 18.57 23.14    Symptoms Yes (comment) Yes (comment)    Comments SOB SOB    Resting HR 79 bpm 70 bpm    Resting BP 104/62 126/70    Resting Oxygen Saturation  96 % 98 %    Exercise Oxygen Saturation  during 6 min walk 97 % 97 %    Max Ex. HR 110 bpm 120 bpm    Max Ex. BP 128/72 130/64    2 Minute Post BP 108/60 --                 Social History   Tobacco Use  Smoking Status Never  Smokeless Tobacco Never    Goals Met:  Independence with exercise equipment Exercise tolerated well No report of cardiac concerns or symptoms  Goals Unmet:  Not Applicable  Comments: Pt able to  follow exercise prescription today without complaint.  Will continue to monitor for progression.    Dr. Emily Filbert is Medical Director for [place].  Dr. Ottie Glazier is Medical Director for [facility] Pulmonary Rehabilitation.

## 2021-06-13 ENCOUNTER — Encounter: Payer: BC Managed Care – PPO | Admitting: *Deleted

## 2021-06-13 ENCOUNTER — Other Ambulatory Visit: Payer: Self-pay

## 2021-06-13 DIAGNOSIS — I5022 Chronic systolic (congestive) heart failure: Secondary | ICD-10-CM

## 2021-06-13 NOTE — Progress Notes (Signed)
Daily Session Note  Patient Details  Name: [name] MRN: [id number] Date of Birth: Feb 01, 1996 Referring Provider:   Flowsheet Row Cardiac Rehab from 03/30/2021 in [facility] Cardiac and Pulmonary Rehab  Referring Provider Glori Bickers MD       Encounter Date: 06/13/2021  Check In:  Session Check In - 06/13/21 0831       Check-In   Supervising physician immediately available to respond to emergencies See telemetry face sheet for immediately available ER MD    Location [facility]-Cardiac & Pulmonary Rehab    Staff Present Heath Lark, RN, BSN, Lance Sell, BA, ACSM CEP, Exercise Physiologist;Kelly Amedeo Plenty, BS, ACSM CEP, Exercise Physiologist    Virtual Visit No    Medication changes reported     No    Fall or balance concerns reported    No    Warm-up and Cool-down Performed on first and last piece of equipment    Resistance Training Performed Yes    VAD Patient? No    PAD/SET Patient? No      Pain Assessment   Currently in Pain? No/denies                Social History   Tobacco Use  Smoking Status Never  Smokeless Tobacco Never    Goals Met:  Independence with exercise equipment Exercise tolerated well No report of cardiac concerns or symptoms  Goals Unmet:  Not Applicable  Comments: Pt able to follow exercise prescription today without complaint.  Will continue to monitor for progression.    Dr. Emily Filbert is Medical Director for [place].  Dr. Ottie Glazier is Medical Director for [facility] Pulmonary Rehabilitation.

## 2021-06-14 ENCOUNTER — Other Ambulatory Visit: Payer: Self-pay

## 2021-06-14 ENCOUNTER — Ambulatory Visit: Payer: BC Managed Care – PPO | Admitting: Genetic Counselor

## 2021-06-15 ENCOUNTER — Encounter: Payer: Self-pay | Admitting: *Deleted

## 2021-06-15 DIAGNOSIS — I5022 Chronic systolic (congestive) heart failure: Secondary | ICD-10-CM | POA: Diagnosis not present

## 2021-06-15 NOTE — Patient Instructions (Signed)
Discharge Patient Instructions  Patient Details  Name: [name] MRN: [id number] Date of Birth: Jan 17, 1996 Referring Provider:  Jolaine Artist, MD   Number of Visits: 75  Reason for Discharge:  Patient reached a stable level of exercise. Patient independent in their exercise. Patient has met program and personal goals.  Smoking History:  Social History   Tobacco Use  Smoking Status Never  Smokeless Tobacco Never    Diagnosis:  Heart failure, chronic systolic (HCC)  Initial Exercise Prescription:  Initial Exercise Prescription - 03/30/21 1300       Date of Initial Exercise RX and Referring Provider   Date 03/30/21    Referring Provider Glori Bickers MD      Treadmill   MPH 2.2    Grade 1    Minutes 15    METs 2.99      Recumbant Bike   Level 3    RPM 50    Watts 50    Minutes 15    METs 3      Recumbant Elliptical   Level 2    RPM 50    Minutes 15    METs 3      Elliptical   Level 1    Speed 4    Minutes 15    METs 3      REL-XR   Level 3    Speed 50    Minutes 15    METs 3      Prescription Details   Frequency (times per week) 3    Duration Progress to 30 minutes of continuous aerobic without signs/symptoms of physical distress      Intensity   THRR 40-80% of Max Heartrate 126-173    Ratings of Perceived Exertion 11-13    Perceived Dyspnea 0-4      Progression   Progression Continue to progress workloads to maintain intensity without signs/symptoms of physical distress.      Resistance Training   Training Prescription Yes    Weight 4 lb    Reps 10-15             Discharge Exercise Prescription (Final Exercise Prescription Changes):  Exercise Prescription Changes - 06/07/21 1300       Response to Exercise   Blood Pressure (Admit) 106/60    Blood Pressure (Exit) 90/60    Heart Rate (Admit) 93 bpm    Heart Rate (Exercise) 137 bpm    Heart Rate (Exit) 110 bpm    Rating of Perceived Exertion (Exercise) 16     Symptoms none    Duration Continue with 30 min of aerobic exercise without signs/symptoms of physical distress.    Intensity THRR unchanged      Progression   Progression Continue to progress workloads to maintain intensity without signs/symptoms of physical distress.    Average METs 4.5      Resistance Training   Training Prescription Yes    Weight 6 lb    Reps 10-15      Interval Training   Interval Training No      Treadmill   MPH 3.4    Grade 2    Minutes 15    METs 4.54      Elliptical   Level 1    Minutes 15      Home Exercise Plan   Plans to continue exercise at Home (comment)   walking, treadmill, staff videos   Frequency Add 2 additional days to program exercise sessions.  Initial Home Exercises Provided 04/06/21             Functional Capacity:  6 Minute Walk     Row Name 03/30/21 1351 06/10/21 0736       6 Minute Walk   Phase Initial Discharge    Distance 1175 feet 1720 feet    Distance % Change -- 46.4 %    Distance Feet Change -- 545 ft    Walk Time 6 minutes 6 minutes    # of Rest Breaks 0 0    MPH 2.22 3.26    METS 5.31 6.61    RPE 12 13    Perceived Dyspnea  1 1    VO2 Peak 18.57 23.14    Symptoms Yes (comment) Yes (comment)    Comments SOB SOB    Resting HR 79 bpm 70 bpm    Resting BP 104/62 126/70    Resting Oxygen Saturation  96 % 98 %    Exercise Oxygen Saturation  during 6 min walk 97 % 97 %    Max Ex. HR 110 bpm 120 bpm    Max Ex. BP 128/72 130/64    2 Minute Post BP 108/60 --             Nutrition:  Nutrition Therapy & Goals - 04/14/21 1141       Nutrition Therapy   Diet Heart healthy, low Na    Protein (specify units) 65g    Fiber 25 grams    Whole Grain Foods 3 servings    Saturated Fats 12 max. 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 She feels she doesn't need to review any education, but would like to discuss easy ways to get back into her normal eating routine. Discussed ingredient prepping and using ingredients that could be used in a variety of ways especially as leftovers.      Intervention Plan   Intervention Prescribe, educate and counsel regarding individualized specific dietary modifications aiming towards targeted core components such as weight, hypertension, lipid management, diabetes, heart failure and other comorbidities.    Expected Outcomes Short Term Goal: Understand basic principles of dietary content, such as calories, fat, sodium, cholesterol and nutrients.;Short Term Goal: A plan has been developed with personal nutrition goals set during dietitian appointment.;Long Term Goal: Adherence to prescribed nutrition plan.            Goals reviewed with patient; copy given to patient.

## 2021-06-15 NOTE — Progress Notes (Signed)
Daily Session Note  Patient Details  Name: [name] MRN: [id number] Date of Birth: 02-Feb-1996 Referring Provider:   Flowsheet Row Cardiac Rehab from 03/30/2021 in [facility] Cardiac and Pulmonary Rehab  Referring Provider Glori Bickers MD       Encounter Date: 06/15/2021  Check In:  Session Check In - 06/15/21 0719       Check-In   Supervising physician immediately available to respond to emergencies See telemetry face sheet for immediately available ER MD    Location [facility]-Cardiac & Pulmonary Rehab    Staff Present Birdie Sons, MPA, Nino Glow, MS, ASCM CEP, Exercise Physiologist;Amanda Oletta Darter, BA, ACSM CEP, Exercise Physiologist    Virtual Visit No    Medication changes reported     No    Fall or balance concerns reported    No    Warm-up and Cool-down Performed on first and last piece of equipment    Resistance Training Performed Yes    VAD Patient? No    PAD/SET Patient? No      Pain Assessment   Currently in Pain? No/denies                Social History   Tobacco Use  Smoking Status Never  Smokeless Tobacco Never    Goals Met:  Independence with exercise equipment Exercise tolerated well No report of cardiac concerns or symptoms Strength training completed today  Goals Unmet:  Not Applicable  Comments: Pt able to follow exercise prescription today without complaint.  Will continue to monitor for progression.     Dr. Emily Filbert is Medical Director for [place].  Dr. Ottie Glazier is Medical Director for [facility] Pulmonary Rehabilitation.

## 2021-06-15 NOTE — Progress Notes (Signed)
Remote ICD transmission.   

## 2021-06-15 NOTE — Progress Notes (Signed)
Cardiac Individual Treatment Plan  Patient Details  Name: [name] MRN: [id number] Date of Birth: [birth year] Referring Provider:   Flowsheet Row Cardiac Rehab from 03/30/2021 in [facility] Cardiac and Pulmonary Rehab  Referring Provider Glori Bickers MD       Initial Encounter Date:  Flowsheet Row Cardiac Rehab from 03/30/2021 in [facility] Cardiac and Pulmonary Rehab  Date 03/30/21       Visit Diagnosis: Heart failure, chronic systolic ([place])  Patient's Home Medications on Admission:  Current Outpatient Medications:    apixaban (ELIQUIS) 5 MG TABS tablet, Take 1 tablet (5 mg total) by mouth 2 (two) times daily., Disp: 180 tablet, Rfl: 3   carvedilol (COREG) 6.25 MG tablet, Take 1 tablet (6.25 mg total) by mouth in the morning and at bedtime., Disp: 180 tablet, Rfl: 3   empagliflozin (JARDIANCE) 10 MG TABS tablet, Take 10 mg by mouth daily., Disp: , Rfl:    potassium chloride SA (KLOR-CON) 20 MEQ tablet, Take 1 tablet (20 mEq total) by mouth daily., Disp: 90 tablet, Rfl: 3   sacubitril-valsartan (ENTRESTO) 24-26 MG, Take 1 tablet by mouth 2 (two) times daily., Disp: 180 tablet, Rfl: 3   sertraline (ZOLOFT) 25 MG tablet, Take 25 mg by mouth daily., Disp: , Rfl:    spironolactone (ALDACTONE) 25 MG tablet, Take 0.5 tablets (12.5 mg total) by mouth at bedtime., Disp: 45 tablet, Rfl: 3   TRI-ESTARYLLA 0.18/0.215/0.25 MG-35 MCG tablet, Take 1 tablet by mouth daily., Disp: , Rfl:    valACYclovir (VALTREX) 1000 MG tablet, Take 1,000 mg by mouth as needed for rash., Disp: , Rfl:   Past Medical History: Past Medical History:  Diagnosis Date   Lactose intolerance    NICM (nonischemic cardiomyopathy) (HCC)    PVC's (premature ventricular contractions)    Torsades de pointes (HCC)     Tobacco Use: Social History   Tobacco Use  Smoking Status Never  Smokeless Tobacco Never    Labs: Recent Review Flowsheet Data   There is no flowsheet data to display.      Exercise Target  Goals: Exercise Program Goal: Individual exercise prescription set using results from initial 6 min walk test and THRR while considering  patient's activity barriers and safety.   Exercise Prescription Goal: Initial exercise prescription builds to 30-45 minutes a day of aerobic activity, 2-3 days per week.  Home exercise guidelines will be given to patient during program as part of exercise prescription that the participant will acknowledge.   Education: Aerobic Exercise: - Group verbal and visual presentation on the components of exercise prescription. Introduces F.I.T.T principle from ACSM for exercise prescriptions.  Reviews F.I.T.T. principles of aerobic exercise including progression. Written material given at graduation.   Education: Resistance Exercise: - Group verbal and visual presentation on the components of exercise prescription. Introduces F.I.T.T principle from ACSM for exercise prescriptions  Reviews F.I.T.T. principles of resistance exercise including progression. Written material given at graduation. Flowsheet Row Cardiac Rehab from 05/25/2021 in [facility] Cardiac and Pulmonary Rehab  Date 05/04/21  Educator [facility]  Instruction Review Code 1- Verbalizes Understanding        Education: Exercise & Equipment Safety: - Individual verbal instruction and demonstration of equipment use and safety with use of the equipment. Flowsheet Row Cardiac Rehab from 05/25/2021 in [facility] Cardiac and Pulmonary Rehab  Date 03/30/21  Educator [facility]  Instruction Review Code 1- Verbalizes Understanding       Education: Exercise Physiology & General Exercise Guidelines: - Group verbal and written instruction  with models to review the exercise physiology of the cardiovascular system and associated critical values. Provides general exercise guidelines with specific guidelines to those with heart or lung disease.  Flowsheet Row Cardiac Rehab from 05/25/2021 in [facility] Cardiac and Pulmonary Rehab  Date 04/20/21   Educator [facility]  Instruction Review Code 1- Verbalizes Understanding       Education: Flexibility, Balance, Mind/Body Relaxation: - Group verbal and visual presentation with interactive activity on the components of exercise prescription. Introduces F.I.T.T principle from ACSM for exercise prescriptions. Reviews F.I.T.T. principles of flexibility and balance exercise training including progression. Also discusses the mind body connection.  Reviews various relaxation techniques to help reduce and manage stress (i.e. Deep breathing, progressive muscle relaxation, and visualization). Balance handout provided to take home. Written material given at graduation.   Activity Barriers & Risk Stratification:  Activity Barriers & Cardiac Risk Stratification - 03/30/21 1352       Activity Barriers & Cardiac Risk Stratification   Activity Barriers Shortness of Breath;Deconditioning    Cardiac Risk Stratification High             6 Minute Walk:  6 Minute Walk     Row Name 03/30/21 1351 06/10/21 0736       6 Minute Walk   Phase Initial Discharge    Distance 1175 feet 1720 feet    Distance % Change -- 46.4 %    Distance Feet Change -- 545 ft    Walk Time 6 minutes 6 minutes    # of Rest Breaks 0 0    MPH 2.22 3.26    METS 5.31 6.61    RPE 12 13    Perceived Dyspnea  1 1    VO2 Peak 18.57 23.14    Symptoms Yes (comment) Yes (comment)    Comments SOB SOB    Resting HR 79 bpm 70 bpm    Resting BP 104/62 126/70    Resting Oxygen Saturation  96 % 98 %    Exercise Oxygen Saturation  during 6 min walk 97 % 97 %    Max Ex. HR 110 bpm 120 bpm    Max Ex. BP 128/72 130/64    2 Minute Post BP 108/60 --             Oxygen Initial Assessment:   Oxygen Re-Evaluation:   Oxygen Discharge (Final Oxygen Re-Evaluation):   Initial Exercise Prescription:  Initial Exercise Prescription - 03/30/21 1300       Date of Initial Exercise RX and Referring Provider   Date 03/30/21     Referring Provider Glori Bickers MD      Treadmill   MPH 2.2    Grade 1    Minutes 15    METs 2.99      Recumbant Bike   Level 3    RPM 50    Watts 50    Minutes 15    METs 3      Recumbant Elliptical   Level 2    RPM 50    Minutes 15    METs 3      Elliptical   Level 1    Speed 4    Minutes 15    METs 3      REL-XR   Level 3    Speed 50    Minutes 15    METs 3      Prescription Details   Frequency (times per week) 3    Duration Progress to  30 minutes of continuous aerobic without signs/symptoms of physical distress      Intensity   THRR 40-80% of Max Heartrate 126-173    Ratings of Perceived Exertion 11-13    Perceived Dyspnea 0-4      Progression   Progression Continue to progress workloads to maintain intensity without signs/symptoms of physical distress.      Resistance Training   Training Prescription Yes    Weight 4 lb    Reps 10-15             Perform Capillary Blood Glucose checks as needed.  Exercise Prescription Changes:   Exercise Prescription Changes     Row Name 03/30/21 1300 04/06/21 0900 04/11/21 1100 04/26/21 1400 05/12/21 0800     Response to Exercise   Blood Pressure (Admit) 104/62 -- 98/68 102/60 102/62   Blood Pressure (Exercise) 128/72 -- 102/58 122/62 --   Blood Pressure (Exit) 108/60 -- 104/64 106/62 92/58   Heart Rate (Admit) 79 bpm -- 77 bpm 96 bpm 95 bpm   Heart Rate (Exercise) 110 bpm -- 110 bpm 126 bpm 126 bpm   Heart Rate (Exit) 87 bpm -- 92 bpm 101 bpm 94 bpm   Oxygen Saturation (Admit) 96 % -- -- -- --   Oxygen Saturation (Exercise) 97 % -- -- -- --   Rating of Perceived Exertion (Exercise) 12 -- 13 13 13    Perceived Dyspnea (Exercise) 1 -- -- -- --   Symptoms SOB -- -- none none   Comments walk test -- -- -- --   Duration -- -- Continue with 30 min of aerobic exercise without signs/symptoms of physical distress. Continue with 30 min of aerobic exercise without signs/symptoms of physical distress. Continue  with 30 min of aerobic exercise without signs/symptoms of physical distress.   Intensity -- -- THRR unchanged THRR unchanged THRR unchanged     Progression   Progression -- -- -- Continue to progress workloads to maintain intensity without signs/symptoms of physical distress. Continue to progress workloads to maintain intensity without signs/symptoms of physical distress.   Average METs -- -- -- 4.31 4.49     Resistance Training   Training Prescription -- -- Yes Yes Yes   Weight -- -- 4 lb 6 lb 6 lb   Reps -- -- 10-15 10-15 10-15     Interval Training   Interval Training -- -- -- No No     Treadmill   MPH -- -- 2.2 3 3    Grade -- -- 1 2.5 2.5   Minutes -- -- 15 15 15    METs -- -- 2.99 4.33 4.33     Elliptical   Level -- -- -- 1 1   Speed -- -- -- 4 --   Minutes -- -- -- 15 15   METs -- -- -- 4.1 3.1     REL-XR   Level -- -- 1 3 3    Minutes -- -- 15 15 15    METs -- -- -- 4.5 6.4     Home Exercise Plan   Plans to continue exercise at -- Home (comment)  walking, treadmill, staff videos -- Home (comment)  walking, treadmill, staff videos Home (comment)  walking, treadmill, staff videos   Frequency -- Add 2 additional days to program exercise sessions. -- Add 2 additional days to program exercise sessions. Add 2 additional days to program exercise sessions.   Initial Home Exercises Provided -- 04/06/21 -- 04/06/21 04/06/21    Row Name 05/25/21 1300 05/25/21  1400 06/07/21 1300         Response to Exercise   Blood Pressure (Admit) 100/60 -- 106/60     Blood Pressure (Exit) 102/60 -- 90/60     Heart Rate (Admit) 72 bpm -- 93 bpm     Heart Rate (Exercise) 133 bpm -- 137 bpm     Heart Rate (Exit) 83 bpm -- 110 bpm     Rating of Perceived Exertion (Exercise) 15 -- 16     Symptoms none -- none     Duration Continue with 30 min of aerobic exercise without signs/symptoms of physical distress. -- Continue with 30 min of aerobic exercise without signs/symptoms of physical distress.      Intensity THRR unchanged -- THRR unchanged           Progression       Progression Continue to progress workloads to maintain intensity without signs/symptoms of physical distress. -- Continue to progress workloads to maintain intensity without signs/symptoms of physical distress.     Average METs 4.55 -- 4.5           Resistance Training       Training Prescription Yes -- Yes     Weight 6 lb -- 6 lb     Reps 10-15 -- 10-15           Interval Training       Interval Training No -- No           Treadmill       MPH 3.4 -- 3.4     Grade 2 -- 2     Minutes 15 -- 15     METs 4.54 -- 4.54           Elliptical       Level 1 -- 1     Speed 3.5 -- --     Minutes 15 -- 15     METs 3.1 -- --           REL-XR       Level 5 -- --     Minutes 15 -- --     METs 6 -- --           Home Exercise Plan       Plans to continue exercise at -- Home (comment)  walking, treadmill, staff videos Home (comment)  walking, treadmill, staff videos     Frequency -- Add 2 additional days to program exercise sessions. Add 2 additional days to program exercise sessions.     Initial Home Exercises Provided -- 04/06/21 04/06/21             Exercise Comments:   Exercise Comments     Row Name 03/31/21 0725           Exercise Comments First full day of exercise!  Patient was oriented to gym and equipment including functions, settings, policies, and procedures.  Patient's individual exercise prescription and treatment plan were reviewed.  All starting workloads were established based on the results of the 6 minute walk test done at initial orientation visit.  The plan for exercise progression was also introduced and progression will be customized based on patient's performance and goals.                Exercise Goals and Review:   Exercise Goals     Row Name 03/30/21 1355             Exercise Goals   Increase  Physical Activity Yes       Intervention Provide advice, education,  support and counseling about physical activity/exercise needs.;Develop an individualized exercise prescription for aerobic and resistive training based on initial evaluation findings, risk stratification, comorbidities and participant's personal goals.       Expected Outcomes Short Term: Attend rehab on a regular basis to increase amount of physical activity.;Long Term: Add in home exercise to make exercise part of routine and to increase amount of physical activity.;Long Term: Exercising regularly at least 3-5 days a week.       Increase Strength and Stamina Yes       Intervention Provide advice, education, support and counseling about physical activity/exercise needs.;Develop an individualized exercise prescription for aerobic and resistive training based on initial evaluation findings, risk stratification, comorbidities and participant's personal goals.       Expected Outcomes Short Term: Increase workloads from initial exercise prescription for resistance, speed, and METs.;Short Term: Perform resistance training exercises routinely during rehab and add in resistance training at home;Long Term: Improve cardiorespiratory fitness, muscular endurance and strength as measured by increased METs and functional capacity (6MWT)       Able to understand and use rate of perceived exertion (RPE) scale Yes       Intervention Provide education and explanation on how to use RPE scale       Expected Outcomes Short Term: Able to use RPE daily in rehab to express subjective intensity level;Long Term:  Able to use RPE to guide intensity level when exercising independently       Able to understand and use Dyspnea scale Yes       Intervention Provide education and explanation on how to use Dyspnea scale       Expected Outcomes Short Term: Able to use Dyspnea scale daily in rehab to express subjective sense of shortness of breath during exertion;Long Term: Able to use Dyspnea scale to guide intensity level when exercising  independently       Knowledge and understanding of Target Heart Rate Range (THRR) Yes       Intervention Provide education and explanation of THRR including how the numbers were predicted and where they are located for reference       Expected Outcomes Short Term: Able to state/look up THRR;Long Term: Able to use THRR to govern intensity when exercising independently;Short Term: Able to use daily as guideline for intensity in rehab       Able to check pulse independently Yes       Intervention Provide education and demonstration on how to check pulse in carotid and radial arteries.;Review the importance of being able to check your own pulse for safety during independent exercise       Expected Outcomes Short Term: Able to explain why pulse checking is important during independent exercise;Long Term: Able to check pulse independently and accurately       Understanding of Exercise Prescription Yes       Intervention Provide education, explanation, and written materials on patient's individual exercise prescription       Expected Outcomes Short Term: Able to explain program exercise prescription;Long Term: Able to explain home exercise prescription to exercise independently                Exercise Goals Re-Evaluation :  Exercise Goals Re-Evaluation     Row Name 03/31/21 0725 04/06/21 0915 04/11/21 1148 04/26/21 1435 05/02/21 0756     Exercise Goal Re-Evaluation   Exercise Goals Review Able to  understand and use rate of perceived exertion (RPE) scale;Increase Physical Activity;Knowledge and understanding of Target Heart Rate Range (THRR);Understanding of Exercise Prescription;Increase Strength and Stamina;Able to understand and use Dyspnea scale;Able to check pulse independently Increase Physical Activity;Increase Strength and Stamina;Able to understand and use rate of perceived exertion (RPE) scale;Able to understand and use Dyspnea scale;Knowledge and understanding of Target Heart Rate Range  (THRR);Able to check pulse independently;Understanding of Exercise Prescription Increase Physical Activity;Increase Strength and Stamina Increase Physical Activity;Increase Strength and Stamina;Understanding of Exercise Prescription --   Comments Reviewed RPE and dyspnea scales, THR and program prescription with pt today.  Pt voiced understanding and was given a copy of goals to take home. Reviewed home exercise with pt today.  Pt plans to walk and use parents' treadmill at home for exercise.  Reviewed THR, pulse, RPE, sign and symptoms, pulse oximetery and when to call 911 or MD.  Also discussed weather considerations and indoor options.  Pt voiced understanding. Langley Gauss is doing well with exercise. She attends consistently.  We will monitor progress. Samoria is doing well in rehab.  She is up to 6 lb weights and building up her stamina on the elliptical.  We will continue to monitor her progress. Khristine  has been on vacation the past week.  She walked 3.5-7 miles per day.   Expected Outcomes Short: Use RPE daily to regulate intensity. Long: Follow program prescription in THR. Short: Add in more exercise at home and walk while on vacation over weekend Long: Continue to improve stamina Short:  continue to exercise consistently Long:  improve stamina and MET level Short: Continue to increase workloads Long; Conitnue to improve on elliptical Short: continue to follow program prescription  Long:  build overall stamina    Row Name 05/12/21 0807 05/25/21 1348 05/27/21 0739 06/07/21 1331       Exercise Goal Re-Evaluation   Exercise Goals Review Increase Physical Activity;Increase Strength and Stamina;Understanding of Exercise Prescription Increase Physical Activity;Increase Strength and Stamina;Understanding of Exercise Prescription Increase Physical Activity;Increase Strength and Stamina;Understanding of Exercise Prescription --    Comments Jaena is continuing to do well in rehab. She is continuing  to work within her THR zone and RPE remain in appropriate range. Zahari is doing well in rehab.  She is up to 3.1 METs on the elliptical.  We will continue to monitor her progress. Braylon is now enjoying cardio and will feel better after a cardio workout at gym versus just resistance training.  She likes to do 30 min of cardio and then uses our weight routine.  We talked about starting to add in intervals to help give her an extra boost as well. Cyla is doing very well with exercise and does the TM and ellpitical in class.  Staff will follow up with interval training to see how that is going for her    Expected Outcomes Short: Continue to work up tolerance on elliptical Long: Continue to increase overall MET level Short: Continue to build up stamina on elliptical Long: Continue to improve overall stamina Short: Try intervals Long; continue to exercise on off days. Short: contineu or start intervals Long: become independent with exercise             Discharge Exercise Prescription (Final Exercise Prescription Changes):  Exercise Prescription Changes - 06/07/21 1300       Response to Exercise   Blood Pressure (Admit) 106/60    Blood Pressure (Exit) 90/60    Heart Rate (Admit) 93 bpm  Heart Rate (Exercise) 137 bpm    Heart Rate (Exit) 110 bpm    Rating of Perceived Exertion (Exercise) 16    Symptoms none    Duration Continue with 30 min of aerobic exercise without signs/symptoms of physical distress.    Intensity THRR unchanged      Progression   Progression Continue to progress workloads to maintain intensity without signs/symptoms of physical distress.    Average METs 4.5      Resistance Training   Training Prescription Yes    Weight 6 lb    Reps 10-15      Interval Training   Interval Training No      Treadmill   MPH 3.4    Grade 2    Minutes 15    METs 4.54      Elliptical   Level 1    Minutes 15      Home Exercise Plan   Plans to continue exercise at  Home (comment)   walking, treadmill, staff videos   Frequency Add 2 additional days to program exercise sessions.    Initial Home Exercises Provided 04/06/21             Nutrition:  Target Goals: Understanding of nutrition guidelines, daily intake of sodium <1567m, cholesterol <2091m calories 30% from fat and 7% or less from saturated fats, daily to have 5 or more servings of fruits and vegetables.  Education: All About Nutrition: -Group instruction provided by verbal, written material, interactive activities, discussions, models, and posters to present general guidelines for heart healthy nutrition including fat, fiber, MyPlate, the role of sodium in heart healthy nutrition, utilization of the nutrition label, and utilization of this knowledge for meal planning. Follow up email sent as well. Written material given at graduation. Flowsheet [facility]  Date 05/18/21  [facility] Code 1- Verbalizes Understanding       Biometrics:  Pre Biometrics - 03/30/21 1355       Pre Biometrics   Height 5' 7.5" (1.715 m)    Weight 180 lb 4.8 oz (81.8 kg)    BMI (Calculated) 27.81    Single Leg Stand 30 seconds             Post Biometrics - 06/10/21 0819        Post  Biometrics   Height 5' 7.5" (1.715 m)    Weight 183 lb 8 oz (83.2 kg)    BMI (Calculated) 28.3    Single Leg Stand 30 seconds             Nutrition Therapy Plan and Nutrition Goals:  Nutrition Therapy & Goals - 04/14/21 1141       Nutrition Therapy   Diet Heart healthy, low Na    Protein (specify units) 65g    Fiber 25 grams    Whole Grain Foods 3 servings    Saturated Fats 12 max. 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 She feels she doesn't need to review any education, but would like to discuss easy ways to get back into her normal eating routine.  Discussed ingredient prepping and using ingredients that could be used in a variety of ways especially as leftovers.      Intervention Plan   Intervention Prescribe, educate and counsel regarding individualized specific dietary modifications aiming towards targeted core components such as weight, hypertension, lipid management, diabetes, heart failure and other comorbidities.    Expected Outcomes Short Term Goal: Understand basic principles of dietary content, such as calories, fat, sodium, cholesterol and nutrients.;Short Term Goal: A plan has been developed with personal nutrition goals set during dietitian appointment.;Long Term Goal: Adherence to prescribed nutrition plan.             Nutrition Assessments:  MEDIFICTS Score Key: ?70 Need to make dietary changes  40-70 Heart Healthy Diet ? 40 Therapeutic Level Cholesterol Diet  Flowsheet Row Cardiac Rehab from 03/30/2021 in [facility] Cardiac and Pulmonary Rehab  Picture Your Plate Total Score on Admission 78      Picture Your Plate Scores: <73 Unhealthy dietary pattern with much room for improvement. 41-50 Dietary pattern unlikely to meet recommendations for good health and room for improvement. 51-60 More healthful dietary pattern, with some room for improvement.  >60 Healthy dietary pattern, although there may be some specific behaviors that could be improved.    Nutrition Goals Re-Evaluation:  Nutrition Goals Re-Evaluation     Row Name 05/02/21 0801 05/27/21 0823           Goals   Nutrition Goal -- Heart healthy and variety      Comment Vitoria has tried a couple bowls as suggested by [employer].  She monitors sodium Claryssa is back to eating more salads and enjoying mixing them up differently each day.   She is aiming for a good variety and watching her sodium.  She no longer feels limited in what she eats or avoids anything, she just uses moderation to help.  She is also currently the main cook in the house and enjoys it.  She has gotten back into better habits of meal planning versus grab and go.      Expected Outcome ST;  check with Melissa about amount of sodium Long: maintain heart healthy diet Short: Continue to cook a variety Long: Continue to focus on healthy eating overall               Nutrition Goals Discharge (Final Nutrition Goals Re-Evaluation):  Nutrition Goals Re-Evaluation - 05/27/21 0823       Goals   Nutrition Goal Heart healthy and variety    Comment Afrah is back to eating more salads and enjoying mixing them up differently each day.  She is aiming for a good variety and watching her sodium.  She no longer feels limited in what she eats or avoids anything, she just uses moderation to help.  She is also currently the main cook in the house and enjoys it.  She has gotten back into better habits of meal planning versus grab and go.    Expected Outcome Short: Continue to cook a variety Long: Continue to focus on healthy eating overall             Psychosocial: Target Goals: Acknowledge presence or absence of significant depression and/or stress, maximize coping skills, provide positive support system. Participant is able to verbalize types and ability to use techniques and skills needed for reducing stress and depression.   Education: Stress, Anxiety, and Depression - Group verbal and visual presentation to define topics covered.  Reviews how body is impacted by stress,  anxiety, and depression.  Also discusses healthy ways to reduce stress and to treat/manage anxiety and depression.  Written material given at graduation. Flowsheet Row Cardiac Rehab from 05/25/2021 in [facility] Cardiac and Pulmonary Rehab  Date 04/13/21  Educator [facility]  Instruction Review Code 1-  [employer] Understanding       Education: Sleep Hygiene -Provides group verbal and written instruction about how sleep can affect your health.  Define sleep hygiene, discuss sleep cycles and impact of sleep habits. Review good sleep hygiene tips.    Initial Review & Psychosocial Screening:  Initial Psych Review & Screening - 03/28/21 1126       Initial Review   Current issues with Current Stress Concerns   History of generalized Anxiety   Source of Stress Concerns Unable to participate in former interests or hobbies    Comments Home from school in [place] program is virtual . 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      Family Dynamics   Good Support System? Yes   DIrect family.  Mom and Dad and younger sister , boyfrient, best frient in [place], sister not living at home     Barriers   Psychosocial barriers to participate in program There are no identifiable barriers or psychosocial needs.;The patient should benefit from training in stress management and relaxation.      Screening Interventions   Interventions Encouraged to exercise    Expected Outcomes Short Term goal: Utilizing psychosocial counselor, staff and physician to assist with identification of specific Stressors or current issues interfering with healing process. Setting desired goal for each stressor or current issue identified.;Long Term Goal: Stressors or current issues are controlled or eliminated.;Short Term goal: Identification and review with participant of any Quality of Life or Depression concerns found by scoring the questionnaire.;Long Term goal: The participant improves quality of Life and PHQ9 Scores as seen by post scores and/or verbalization of changes             Quality of Life Scores:   Quality of Life - 03/30/21 1356       Quality of Life   Select Quality of Life      Quality of Life Scores   Health/Function Pre 12.13 %    Socioeconomic Pre 25.56 %    Psych/Spiritual Pre 18.71 %     Family Pre 19.8 %    GLOBAL Pre 17.61 %            Scores of 19 and below usually indicate a poorer quality of life in these areas.  A difference of  2-3 points is a clinically meaningful difference.  A difference of 2-3 points in the total score of the Quality of Life Index has been associated with significant improvement in overall quality of life, self-image, physical symptoms, and general health in studies assessing change in quality of life.  PHQ-9: Recent Review Flowsheet Data     Depression screen [facility] 2/9 03/30/2021   Decreased Interest 1   Down, Depressed, Hopeless 1   PHQ - 2 Score 2   Altered sleeping 0   Tired, decreased energy 1   Change in appetite 1   Feeling bad or failure about yourself  0   Trouble concentrating 0   Moving slowly or fidgety/restless 0   Suicidal thoughts 0   PHQ-9 Score 4   Difficult doing work/chores Somewhat difficult      Interpretation of Total Score  Total Score Depression Severity:  1-4 = Minimal depression, 5-9 =  Mild depression, 10-14 = Moderate depression, 15-19 = Moderately severe depression, 20-27 = Severe depression   Psychosocial Evaluation and Intervention:  Psychosocial Evaluation - 03/28/21 1144       Psychosocial Evaluation & Interventions   Interventions Encouraged to exercise with the program and follow exercise prescription    Comments Charese has no barriers to attending the program. [facility] lives with her parents and younger suister at the [place]. She has come home for her heart failure treatment and will be in [place] for awhile. Her support system is good:  Besides her family at home she has a boyfrientd in [place] she goes to school. She has a best friend and her other sister. 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 She is seeing a counselor regarding all the health issues and how it is  effecting her at the moment. She is not on any meds for depression nor anxiety. She has a history of some generalized anxiety and was taken off the meds she was taking as it may have effected her heart rhythm. She is doing well without the meds at this moment. She is ready to get started and wants to learn all she can to become as healthy as she can to get back to her routine and previous life activities. She should do well in the program    Expected Outcomes STG She attends all scheduled sessions, she continues to see her counselor as needed. LTG: She is able to continue to progress to return to her previous level of activities.    Continue Psychosocial Services  Follow up required by staff             Psychosocial Re-Evaluation:  Psychosocial Re-Evaluation     [place] Name 05/02/21 0758 05/27/21 0731           Psychosocial Re-Evaluation   Current issues with Current Stress Concerns;Current Sleep Concerns Current Stress Concerns;Current Anxiety/Panic      Comments Ciria was seeing a therapist but it wasnt a good fit - she feels good and doesnt feel like its an urgent need but plans to find a new therapist. Not being able to drive is hard but she states her family is great and helps her out. She sleeps well. Laylamarie had a rough day yesterday.  She forgot to take her morning meds and when she realized it, it triggered her anxiety.  As such, she had chest pain and dizzy spells all day yesterday. Her pressures were normal at the end of the day. She took morning at noon and night time about an hour later than normal.  Today, she is still having some dizziness, but better than yesterday and no chest pain.   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  We talked about setting up a plan to check in with her friends and neighbors to help.  She was seeing a therapist but his style was not meshing, and so she is going to look into Talk  or another therapist up there.  She is also going to talk with Dr. Haroldine Laws about getting back on her anxiety meds.  She is sleeping well. She is trying to make plans to stay calm.      Expected Outcomes Short: continue to exercise to help manage stres Long:  manage  stress long term Short: Talk to doctor about anxiety meds and find therapist that works.  Long; continue to work towards moving               Psychosocial Discharge (Final Psychosocial Re-Evaluation):  Psychosocial Re-Evaluation - 05/27/21 0731       Psychosocial Re-Evaluation   Current issues with Current Stress Concerns;Current Anxiety/Panic    Comments Devinn had a rough day yesterday.  She forgot to take her morning meds and when she realized it, it triggered her anxiety.  As such, she had chest pain and dizzy spells all day yesterday. Her pressures were normal at the end of the day. She took morning at noon and night time about an hour later than normal.  Today, she is still having some dizziness, but better than yesterday and no chest pain.   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  We talked about setting up a plan to check in with her friends and neighbors to help.  She was seeing a therapist but his style was not meshing, and so she is going to look into [facility] or another therapist up there.  She is also going to talk with Dr. Haroldine Laws about getting back on her anxiety meds.  She is sleeping well. She is trying to make plans to stay calm.    Expected Outcomes Short: Talk to doctor about anxiety meds and find therapist that works.  Long; continue to work towards moving             Vocational Rehabilitation: Provide vocational rehab assistance to qualifying candidates.   Vocational Rehab Evaluation & Intervention:  Vocational Rehab - 03/28/21 1136       Initial Vocational Rehab Evaluation & Intervention   Assessment shows need for Vocational  Rehabilitation No             Education: Education Goals: Education classes will be provided on a variety of topics geared toward better understanding of heart health and risk factor modification. Participant will state understanding/return demonstration of topics presented as noted by education test scores.  Learning Barriers/Preferences:  Learning Barriers/Preferences - 03/28/21 1135       Learning Barriers/Preferences   Learning Barriers None    Learning Preferences None             General Cardiac Education Topics:  AED/CPR: - Group verbal and written instruction with the use of models to demonstrate the basic use of the AED with the basic ABC's of resuscitation.   Anatomy and Cardiac Procedures: - Group verbal and visual presentation and models provide information about basic cardiac anatomy and function. Reviews the testing methods done to diagnose heart disease and the outcomes of the test results. Describes the treatment choices: Medical Management, Angioplasty, or Coronary Bypass Surgery for treating various heart conditions including Myocardial Infarction, Angina, Valve Disease, and Cardiac Arrhythmias.  Written material given at graduation. Flowsheet Row Cardiac Rehab from 05/25/2021 in [facility] Cardiac and Pulmonary Rehab  Date 05/04/21  Educator SB  Instruction Review Code 1- Verbalizes Understanding       Medication Safety: - Group verbal and visual instruction to review commonly prescribed medications for heart and lung disease. Reviews the medication, class of the drug, and side effects. Includes the steps to properly store meds and maintain the prescription regimen.  Written material given at graduation. Flowsheet Row Cardiac Rehab from 05/25/2021 in [facility] Cardiac and Pulmonary Rehab  Date 05/25/21  Educator SB  Instruction Review Code 1- Verbalizes Understanding       Intimacy: - Group verbal instruction through game format to discuss how heart and lung  disease can affect sexual intimacy. Written material given at graduation..   Know Your Numbers and Heart Failure: - Group verbal and visual instruction to discuss disease risk factors for cardiac and pulmonary disease and treatment options.  Reviews associated critical values for Overweight/Obesity, Hypertension, Cholesterol, and Diabetes.  Discusses basics of heart failure: signs/symptoms and treatments.  Introduces Heart Failure Zone chart for action plan for heart failure.  Written material given at graduation. Flowsheet Row Cardiac Rehab from 05/25/2021 in [facility] Cardiac and Pulmonary Rehab  Date 03/31/21  Educator SB  Instruction Review Code 1- Verbalizes Understanding       Infection Prevention: - Provides verbal and written material to individual with discussion of infection control including proper hand washing and proper equipment cleaning during exercise session. Flowsheet Row Cardiac Rehab from 05/25/2021 in [facility] Cardiac and Pulmonary Rehab  Date 03/30/21  Educator [facility]  Instruction Review Code 1- Verbalizes Understanding       Falls Prevention: - Provides verbal and written material to individual with discussion of falls prevention and safety. Flowsheet Row Cardiac Rehab from 05/25/2021 in [facility] Cardiac and Pulmonary Rehab  Date 03/28/21  Educator SB  Instruction Review Code 1- Verbalizes Understanding       Other: -Provides group and verbal instruction on various topics (see comments)   Knowledge Questionnaire Score:  Knowledge Questionnaire Score - 03/30/21 1357       Knowledge Questionnaire Score   Pre Score 26/26             Core Components/Risk Factors/Patient Goals at Admission:  Personal Goals and Risk Factors at Admission - 03/30/21 1357       Core Components/Risk Factors/Patient Goals on Admission    Weight Management Yes;Weight Loss    Intervention Weight Management: Develop a combined nutrition and exercise program designed to reach desired  caloric intake, while maintaining appropriate intake of nutrient and fiber, sodium and fats, and appropriate energy expenditure required for the weight goal.;Weight Management: Provide education and appropriate resources to help participant work on and attain dietary goals.;Weight Management/Obesity: Establish reasonable short term and long term weight goals.    Admit Weight 180 lb 4.8 oz (81.8 kg)    Goal Weight: Short Term 175 lb (79.4 kg)    Goal Weight: Long Term 175 lb (79.4 kg)    Expected Outcomes Short Term: Continue to assess and modify interventions until short term weight is achieved;Long Term: Adherence to nutrition and physical activity/exercise program aimed toward attainment of established weight goal;Weight Loss: Understanding of general recommendations for a balanced deficit meal plan, which promotes 1-2 lb weight loss per week and includes a negative energy balance of [id number] kcal/d;Understanding recommendations for meals to include 15-35% energy as protein, 25-35% energy from fat, 35-60% energy from carbohydrates, less than 227m of dietary cholesterol, 20-35 gm of total fiber daily;Understanding of distribution of calorie intake throughout the day with the consumption of 4-5 meals/snacks    Heart Failure Yes    Intervention Provide a combined exercise and nutrition program that is supplemented with education, support and counseling about heart failure. Directed toward relieving symptoms such as shortness of breath, decreased exercise tolerance, and extremity edema.    Expected Outcomes Improve functional capacity of life;Short term: Attendance in program 2-3 days a week with increased exercise capacity. Reported lower sodium  intake. Reported increased fruit and vegetable intake. Reports medication compliance.;Short term: Daily weights obtained and reported for increase. Utilizing diuretic protocols set by physician.;Long term: Adoption of self-care skills and reduction of barriers for  early signs and symptoms recognition and intervention leading to self-care maintenance.             Education:Diabetes - Individual verbal and written instruction to review signs/symptoms of diabetes, desired ranges of glucose level fasting, after meals and with exercise. Acknowledge that pre and post exercise glucose checks will be done for 3 sessions at entry of program.   Core Components/Risk Factors/Patient Goals Review:   Goals and Risk Factor Review     Row Name 05/02/21 0752 05/27/21 0736           Core Components/Risk Factors/Patient Goals Review   Personal Goals Review Weight Management/Obesity;Heart Failure Weight Management/Obesity;Heart Failure      Review Michaelann monitors weight daily and reports no signs or symtpoms of heat failure.  Weight has stayed steady.  She sees Dr. Haroldine Laws today and will ask about feeling fatigued and having muscle twitches after exercise.  She plans to ask Dr about these symptoms. Christal is doing well in rehab.  Her weight is holding steady around 180-182 lb.  She would like to lose weight but would like to focus more on life style change versus just losing weight.  She is staying on top of her heart failure.  She had a rough day yesterday but feeling better today.  Her anxiety is still a struggle and flared her pressure some for her today.  v      Expected Outcomes Short: follow up with Dr today Long: manage heart failure Short: Keep close eye on symptoms next couple of days Long: Conitnue to manage heart failure and work on weight loss.               Core Components/Risk Factors/Patient Goals at Discharge (Final Review):   Goals and Risk Factor Review - 05/27/21 0736       Core Components/Risk Factors/Patient Goals Review   Personal Goals Review Weight Management/Obesity;Heart Failure    Review Secilia is doing well in rehab.  Her weight is holding steady around 180-182 lb.  She would like to lose weight but would like to  focus more on life style change versus just losing weight.  She is staying on top of her heart failure.  She had a rough day yesterday but feeling better today.  Her anxiety is still a struggle and flared her pressure some for her today.  v    Expected Outcomes Short: Keep close eye on symptoms next couple of days Long: Conitnue to manage heart failure and work on weight loss.             ITP Comments:  ITP Comments     Row Name 03/28/21 1144 03/30/21 1351 03/31/21 0724 04/20/21 0735 05/18/21 0714   ITP Comments Virtual orientation call completed today. shehas an appointment on Date: 03/30/2021 for EP eval and gym Orientation.  Documentation of diagnosis can be found in [facility] Date: 02/27/2021. Completed 6MWT and gym orientation. Initial ITP created and sent for review to Dr. Emily Filbert, Medical Director. First full day of exercise!  Patient was oriented to gym and equipment including functions, settings, policies, and procedures.  Patient's individual exercise prescription and treatment plan were reviewed.  All starting workloads were established based on the results of the 6 minute walk test done at initial orientation  visit.  The plan for exercise progression was also introduced and progression will be customized based on patient's performance and goals. 30 Day review completed. Medical Director ITP review done, changes made as directed, and signed approval by Medical Director.   new to program 30 Day review completed. Medical Director ITP review done, changes made as directed, and signed approval by Medical Director.    [place] Name 05/27/21 0731 06/15/21 0719         ITP Comments Anu had a rough day yesterday.  She forgot to take her morning meds and when she realized it, it triggered her anxiety.  As such, she had chest pain and dizzy spells all day yesterday. Her pressures were normal at the end of the day. She took morning at noon and night time about an hour later than normal.  Today, she  is still having some dizziness, but better than yesterday and no chest pain. 30 Day review completed. Medical Director ITP review done, changes made as directed, and signed approval by Medical Director.               Comments:

## 2021-06-17 ENCOUNTER — Encounter: Payer: BC Managed Care – PPO | Admitting: *Deleted

## 2021-06-17 ENCOUNTER — Other Ambulatory Visit: Payer: Self-pay

## 2021-06-17 DIAGNOSIS — I5022 Chronic systolic (congestive) heart failure: Secondary | ICD-10-CM

## 2021-06-17 NOTE — Progress Notes (Signed)
Daily Session Note  Patient Details  Name: [name] MRN: [id number] Date of Birth: [birth year] Referring Provider:   Flowsheet Row Cardiac Rehab from 03/30/2021 in [facility] Cardiac and Pulmonary Rehab  Referring Provider Glori Bickers MD       Encounter Date: 06/17/2021  Check In:  Session Check In - 06/17/21 0737       Check-In   Supervising physician immediately available to respond to emergencies See telemetry face sheet for immediately available ER MD    Location [facility]-Cardiac & Pulmonary Rehab    Staff Present Heath Lark, RN, BSN, CCRP;Melissa Stonewall, RDN, LDN;Jessica Luan Pulling, MA, RCEP, CCRP, CCET    Virtual Visit No    Medication changes reported     No    Warm-up and Cool-down Performed on first and last piece of equipment    Resistance Training Performed Yes    VAD Patient? No    PAD/SET Patient? No      Pain Assessment   Currently in Pain? No/denies                Social History   Tobacco Use  Smoking Status Never  Smokeless Tobacco Never    Goals Met:  Independence with exercise equipment Exercise tolerated well No report of cardiac concerns or symptoms  Goals Unmet:  Not Applicable  Comments: Pt able to follow exercise prescription today without complaint.  Will continue to monitor for progression.    Dr. Emily Filbert is Medical Director for [place].  Dr. Ottie Glazier is Medical Director for [facility] Pulmonary Rehabilitation.

## 2021-06-20 ENCOUNTER — Other Ambulatory Visit: Payer: Self-pay

## 2021-06-20 ENCOUNTER — Encounter: Payer: BC Managed Care – PPO | Admitting: *Deleted

## 2021-06-20 DIAGNOSIS — I5022 Chronic systolic (congestive) heart failure: Secondary | ICD-10-CM | POA: Diagnosis not present

## 2021-06-20 NOTE — Progress Notes (Signed)
Daily Session Note  Patient Details  Name: [name] MRN: [id number] Date of Birth: [birth year] Referring Provider:   Flowsheet Row Cardiac Rehab from 03/30/2021 in [facility] Cardiac and Pulmonary Rehab  Referring Provider Glori Bickers MD       Encounter Date: 06/20/2021  Check In:  Session Check In - 06/20/21 0803       Check-In   Supervising physician immediately available to respond to emergencies See telemetry face sheet for immediately available ER MD    Location [facility]-Cardiac & Pulmonary Rehab    Staff Present Heath Lark, RN, BSN, Laveda Norman, BS, ACSM CEP, Exercise Physiologist;Joseph Finger, [place]    Virtual Visit No    Medication changes reported     No    Fall or balance concerns reported    No    Warm-up and Cool-down Performed on first and last piece of equipment    Resistance Training Performed Yes    VAD Patient? No    PAD/SET Patient? No      Pain Assessment   Currently in Pain? No/denies                Social History   Tobacco Use  Smoking Status Never  Smokeless Tobacco Never    Goals Met:  Independence with exercise equipment Exercise tolerated well No report of cardiac concerns or symptoms  Goals Unmet:  Not Applicable  Comments: Pt able to follow exercise prescription today without complaint.  Will continue to monitor for progression.    Dr. Emily Filbert is Medical Director for [place].  Dr. Ottie Glazier is Medical Director for [facility] Pulmonary Rehabilitation.

## 2021-06-22 ENCOUNTER — Other Ambulatory Visit: Payer: Self-pay

## 2021-06-22 DIAGNOSIS — I5022 Chronic systolic (congestive) heart failure: Secondary | ICD-10-CM

## 2021-06-22 NOTE — Progress Notes (Signed)
Discharge Progress Report  Patient Details  Name: [name] MRN: [id number] Date of Birth: 1996-03-20 Referring Provider:   Flowsheet Row Cardiac Rehab from 03/30/2021 in [facility]  Referring Provider Glori Bickers MD        Number of Visits: 36  Reason for Discharge:  Patient reached a stable level of exercise. Patient independent in their exercise. Patient has met program and personal goals.  Smoking History:  Social History   Tobacco Use  Smoking Status Never  Smokeless Tobacco Never    Diagnosis:  Heart failure, chronic systolic (HCC)  ADL [facility]SD:   Initial Exercise Prescription:  Initial Exercise Prescription - 03/30/21 1300       Date of Initial Exercise RX and Referring Provider   Date 03/30/21    Referring Provider Glori Bickers MD      Treadmill   MPH 2.2    Grade 1    Minutes 15    METs 2.99      Recumbant Bike   Level 3    RPM 50    Watts 50    Minutes 15    METs 3      Recumbant Elliptical   Level 2    RPM 50    Minutes 15    METs 3      Elliptical   Level 1    Speed 4    Minutes 15    METs 3      REL-XR   Level 3    Speed 50    Minutes 15    METs 3      Prescription Details   Frequency (times per week) 3    Duration Progress to 30 minutes of continuous aerobic without signs/symptoms of physical distress      Intensity   THRR 40-80% of Max Heartrate 126-173    Ratings of Perceived Exertion 11-13    Perceived Dyspnea 0-4      Progression   Progression Continue to progress workloads to maintain intensity without signs/symptoms of physical distress.      Resistance Training   Training Prescription Yes    Weight 4 lb    Reps 10-15             Discharge Exercise Prescription (Final Exercise Prescription Changes):  Exercise Prescription Changes - 06/20/21 1200       Response to Exercise   Blood Pressure (Admit) 108/60    Blood Pressure (Exit) 104/60    Heart Rate (Admit)  82 bpm    Heart Rate (Exercise) 132 bpm    Heart Rate (Exit) 109 bpm    Rating of Perceived Exertion (Exercise) 16    Symptoms none    Duration Continue with 30 min of aerobic exercise without signs/symptoms of physical distress.    Intensity THRR unchanged      Progression   Progression Continue to progress workloads to maintain intensity without signs/symptoms of physical distress.    Average METs 4.99      Resistance Training   Training Prescription Yes    Weight 6 lb    Reps 10-15      Interval Training   Interval Training No      Treadmill   MPH 3.4    Grade 2    Minutes 15    METs 4.54      Elliptical   Level 1    Minutes 15      REL-XR   Level 6  Minutes 15    METs 7.4      Home Exercise Plan   Plans to continue exercise at Home (comment)   walking, treadmill, staff videos   Frequency Add 2 additional days to program exercise sessions.    Initial Home Exercises Provided 04/06/21             Functional Capacity:  6 Minute Walk     Row Name 03/30/21 1351 06/10/21 0736       6 Minute Walk   Phase Initial Discharge    Distance 1175 feet 1720 feet    Distance % Change -- 46.4 %    Distance Feet Change -- 545 ft    Walk Time 6 minutes 6 minutes    # of Rest Breaks 0 0    MPH 2.22 3.26    METS 5.31 6.61    RPE 12 13    Perceived Dyspnea  1 1    VO2 Peak 18.57 23.14    Symptoms Yes (comment) Yes (comment)    Comments SOB SOB    Resting HR 79 bpm 70 bpm    Resting BP 104/62 126/70    Resting Oxygen Saturation  96 % 98 %    Exercise Oxygen Saturation  during 6 min walk 97 % 97 %    Max Ex. HR 110 bpm 120 bpm    Max Ex. BP 128/72 130/64    2 Minute Post BP 108/60 --             Psychological, QOL, Others - Outcomes: PHQ 2/9: Depression screen [facility] 2/9 06/22/2021 03/30/2021  Decreased Interest 0 1  Down, Depressed, Hopeless 1 1  PHQ - 2 Score 1 2  Altered sleeping 0 0  Tired, decreased energy 0 1  Change in appetite 0 1  Feeling bad  or failure about yourself  0 0  Trouble concentrating 0 0  Moving slowly or fidgety/restless 0 0  Suicidal thoughts 0 0  PHQ-9 Score 1 4  Difficult doing work/chores Not difficult at all Somewhat difficult    Quality of Life:  Quality of Life - 06/22/21 0751       Quality of Life Scores   Health/Function Pre 12.13 %    Health/Function Post 25.3 %    Health/Function % Change 108.57 %    Socioeconomic Pre 25.56 %    Socioeconomic Post 28.5 %    Socioeconomic % Change  11.5 %    Psych/Spiritual Pre 18.71 %    Psych/Spiritual Post 24.43 %    Psych/Spiritual % Change 30.57 %    Family Pre 19.8 %    Family Post 27.6 %    Family % Change 39.39 %    GLOBAL Pre 17.61 %    GLOBAL Post 26.19 %    GLOBAL % Change 48.72 %              Nutrition & Weight - Outcomes:  Pre Biometrics - 03/30/21 1355       Pre Biometrics   Height 5' 7.5" (1.715 m)    Weight 180 lb 4.8 oz (81.8 kg)    BMI (Calculated) 27.81    Single Leg Stand 30 seconds             Post Biometrics - 06/10/21 0819        Post  Biometrics   Height 5' 7.5" (1.715 m)    Weight 183 lb 8 oz (83.2 kg)    BMI (Calculated) 28.3  Single Leg Stand 30 seconds             Nutrition:  Nutrition Therapy & Goals - 04/14/21 1141       Nutrition Therapy   Diet Heart healthy, low Na    Protein (specify units) 65g    Fiber 25 grams    Whole Grain Foods 3 servings    Saturated Fats 12 max. 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 She feels she doesn't need to review any education, but would like to discuss easy ways to get back into her normal eating routine. Discussed ingredient prepping and using ingredients that  could be used in a variety of ways especially as leftovers.      Intervention Plan   Intervention Prescribe, educate and counsel regarding individualized specific dietary modifications aiming towards targeted core components such as weight, hypertension, lipid management, diabetes, heart failure and other comorbidities.    Expected Outcomes Short Term Goal: Understand basic principles of dietary content, such as calories, fat, sodium, cholesterol and nutrients.;Short Term Goal: A plan has been developed with personal nutrition goals set during dietitian appointment.;Long Term Goal: Adherence to prescribed nutrition plan.             Nutrition Discharge:   Education Questionnaire Score:  Knowledge Questionnaire Score - 06/22/21 0751       Knowledge Questionnaire Score   Pre Score 26/26    Post Score 26/26             Goals reviewed with patient; copy given to patient.

## 2021-06-22 NOTE — Progress Notes (Signed)
Cardiac Individual Treatment Plan  Patient Details  Name: [name] MRN: [id number] Date of Birth: Mar 06, 1996 Referring Provider:   Flowsheet Row Cardiac Rehab from 03/30/2021 in [facility] Cardiac and Pulmonary Rehab  Referring Provider Glori Bickers MD       Initial Encounter Date:  Flowsheet Row Cardiac Rehab from 03/30/2021 in [facility] Cardiac and Pulmonary Rehab  Date 03/30/21       Visit Diagnosis: Heart failure, chronic systolic ([place])  Patient's Home Medications on Admission:  Current Outpatient Medications:    apixaban (ELIQUIS) 5 MG TABS tablet, Take 1 tablet (5 mg total) by mouth 2 (two) times daily., Disp: 180 tablet, Rfl: 3   carvedilol (COREG) 6.25 MG tablet, Take 1 tablet (6.25 mg total) by mouth in the morning and at bedtime., Disp: 180 tablet, Rfl: 3   empagliflozin (JARDIANCE) 10 MG TABS tablet, Take 10 mg by mouth daily., Disp: , Rfl:    potassium chloride SA (KLOR-CON) 20 MEQ tablet, Take 1 tablet (20 mEq total) by mouth daily., Disp: 90 tablet, Rfl: 3   sacubitril-valsartan (ENTRESTO) 24-26 MG, Take 1 tablet by mouth 2 (two) times daily., Disp: 180 tablet, Rfl: 3   sertraline (ZOLOFT) 25 MG tablet, Take 25 mg by mouth daily., Disp: , Rfl:    spironolactone (ALDACTONE) 25 MG tablet, Take 0.5 tablets (12.5 mg total) by mouth at bedtime., Disp: 45 tablet, Rfl: 3   TRI-ESTARYLLA 0.18/0.215/0.25 MG-35 MCG tablet, Take 1 tablet by mouth daily., Disp: , Rfl:    valACYclovir (VALTREX) 1000 MG tablet, Take 1,000 mg by mouth as needed for rash., Disp: , Rfl:   Past Medical History: Past Medical History:  Diagnosis Date   Lactose intolerance    NICM (nonischemic cardiomyopathy) (HCC)    PVC's (premature ventricular contractions)    Torsades de pointes (HCC)     Tobacco Use: Social History   Tobacco Use  Smoking Status Never  Smokeless Tobacco Never    Labs: Recent Review Flowsheet Data   There is no flowsheet data to display.      Exercise Target  Goals: Exercise Program Goal: Individual exercise prescription set using results from initial 6 min walk test and THRR while considering  patient's activity barriers and safety.   Exercise Prescription Goal: Initial exercise prescription builds to 30-45 minutes a day of aerobic activity, 2-3 days per week.  Home exercise guidelines will be given to patient during program as part of exercise prescription that the participant will acknowledge.   Education: Aerobic Exercise: - Group verbal and visual presentation on the components of exercise prescription. Introduces F.I.T.T principle from ACSM for exercise prescriptions.  Reviews F.I.T.T. principles of aerobic exercise including progression. Written material given at graduation.   Education: Resistance Exercise: - Group verbal and visual presentation on the components of exercise prescription. Introduces F.I.T.T principle from ACSM for exercise prescriptions  Reviews F.I.T.T. principles of resistance exercise including progression. Written material given at graduation. Flowsheet Row Cardiac Rehab from 06/15/2021 in [facility] Cardiac and Pulmonary Rehab  Date 05/04/21  Educator [facility]  Instruction Review Code 1- Verbalizes Understanding        Education: Exercise & Equipment Safety: - Individual verbal instruction and demonstration of equipment use and safety with use of the equipment. Flowsheet Row Cardiac Rehab from 06/15/2021 in [facility] Cardiac and Pulmonary Rehab  Date 03/30/21  Educator [facility]  Instruction Review Code 1- Verbalizes Understanding       Education: Exercise Physiology & General Exercise Guidelines: - Group verbal and written instruction  with models to review the exercise physiology of the cardiovascular system and associated critical values. Provides general exercise guidelines with specific guidelines to those with heart or lung disease.  Flowsheet Row Cardiac Rehab from 06/15/2021 in [facility] Cardiac and Pulmonary Rehab  Date 04/20/21   Educator [facility]  Instruction Review Code 1- Verbalizes Understanding       Education: Flexibility, Balance, Mind/Body Relaxation: - Group verbal and visual presentation with interactive activity on the components of exercise prescription. Introduces F.I.T.T principle from ACSM for exercise prescriptions. Reviews F.I.T.T. principles of flexibility and balance exercise training including progression. Also discusses the mind body connection.  Reviews various relaxation techniques to help reduce and manage stress (i.e. Deep breathing, progressive muscle relaxation, and visualization). Balance handout provided to take home. Written material given at graduation.   Activity Barriers & Risk Stratification:  Activity Barriers & Cardiac Risk Stratification - 03/30/21 1352       Activity Barriers & Cardiac Risk Stratification   Activity Barriers Shortness of Breath;Deconditioning    Cardiac Risk Stratification High             6 Minute Walk:  6 Minute Walk     Row Name 03/30/21 1351 06/10/21 0736       6 Minute Walk   Phase Initial Discharge    Distance 1175 feet 1720 feet    Distance % Change -- 46.4 %    Distance Feet Change -- 545 ft    Walk Time 6 minutes 6 minutes    # of Rest Breaks 0 0    MPH 2.22 3.26    METS 5.31 6.61    RPE 12 13    Perceived Dyspnea  1 1    VO2 Peak 18.57 23.14    Symptoms Yes (comment) Yes (comment)    Comments SOB SOB    Resting HR 79 bpm 70 bpm    Resting BP 104/62 126/70    Resting Oxygen Saturation  96 % 98 %    Exercise Oxygen Saturation  during 6 min walk 97 % 97 %    Max Ex. HR 110 bpm 120 bpm    Max Ex. BP 128/72 130/64    2 Minute Post BP 108/60 --             Oxygen Initial Assessment:   Oxygen Re-Evaluation:   Oxygen Discharge (Final Oxygen Re-Evaluation):   Initial Exercise Prescription:  Initial Exercise Prescription - 03/30/21 1300       Date of Initial Exercise RX and Referring Provider   Date 03/30/21     Referring Provider Glori Bickers MD      Treadmill   MPH 2.2    Grade 1    Minutes 15    METs 2.99      Recumbant Bike   Level 3    RPM 50    Watts 50    Minutes 15    METs 3      Recumbant Elliptical   Level 2    RPM 50    Minutes 15    METs 3      Elliptical   Level 1    Speed 4    Minutes 15    METs 3      REL-XR   Level 3    Speed 50    Minutes 15    METs 3      Prescription Details   Frequency (times per week) 3    Duration Progress to  30 minutes of continuous aerobic without signs/symptoms of physical distress      Intensity   THRR 40-80% of Max Heartrate 126-173    Ratings of Perceived Exertion 11-13    Perceived Dyspnea 0-4      Progression   Progression Continue to progress workloads to maintain intensity without signs/symptoms of physical distress.      Resistance Training   Training Prescription Yes    Weight 4 lb    Reps 10-15             Perform Capillary Blood Glucose checks as needed.  Exercise Prescription Changes:   Exercise Prescription Changes     Row Name 03/30/21 1300 04/06/21 0900 04/11/21 1100 04/26/21 1400 05/12/21 0800     Response to Exercise   Blood Pressure (Admit) 104/62 -- 98/68 102/60 102/62   Blood Pressure (Exercise) 128/72 -- 102/58 122/62 --   Blood Pressure (Exit) 108/60 -- 104/64 106/62 92/58   Heart Rate (Admit) 79 bpm -- 77 bpm 96 bpm 95 bpm   Heart Rate (Exercise) 110 bpm -- 110 bpm 126 bpm 126 bpm   Heart Rate (Exit) 87 bpm -- 92 bpm 101 bpm 94 bpm   Oxygen Saturation (Admit) 96 % -- -- -- --   Oxygen Saturation (Exercise) 97 % -- -- -- --   Rating of Perceived Exertion (Exercise) 12 -- 13 13 13    Perceived Dyspnea (Exercise) 1 -- -- -- --   Symptoms SOB -- -- none none   Comments walk test -- -- -- --   Duration -- -- Continue with 30 min of aerobic exercise without signs/symptoms of physical distress. Continue with 30 min of aerobic exercise without signs/symptoms of physical distress. Continue  with 30 min of aerobic exercise without signs/symptoms of physical distress.   Intensity -- -- THRR unchanged THRR unchanged THRR unchanged     Progression   Progression -- -- -- Continue to progress workloads to maintain intensity without signs/symptoms of physical distress. Continue to progress workloads to maintain intensity without signs/symptoms of physical distress.   Average METs -- -- -- 4.31 4.49     Resistance Training   Training Prescription -- -- Yes Yes Yes   Weight -- -- 4 lb 6 lb 6 lb   Reps -- -- 10-15 10-15 10-15     Interval Training   Interval Training -- -- -- No No     Treadmill   MPH -- -- 2.2 3 3    Grade -- -- 1 2.5 2.5   Minutes -- -- 15 15 15    METs -- -- 2.99 4.33 4.33     Elliptical   Level -- -- -- 1 1   Speed -- -- -- 4 --   Minutes -- -- -- 15 15   METs -- -- -- 4.1 3.1     REL-XR   Level -- -- 1 3 3    Minutes -- -- 15 15 15    METs -- -- -- 4.5 6.4     Home Exercise Plan   Plans to continue exercise at -- Home (comment)  walking, treadmill, staff videos -- Home (comment)  walking, treadmill, staff videos Home (comment)  walking, treadmill, staff videos   Frequency -- Add 2 additional days to program exercise sessions. -- Add 2 additional days to program exercise sessions. Add 2 additional days to program exercise sessions.   Initial Home Exercises Provided -- 04/06/21 -- 04/06/21 04/06/21    Row Name 05/25/21 1300 05/25/21  1400 06/07/21 1300 06/20/21 1200       Response to Exercise   Blood Pressure (Admit) 100/60 -- 106/60 108/60    Blood Pressure (Exit) 102/60 -- 90/60 104/60    Heart Rate (Admit) 72 bpm -- 93 bpm 82 bpm    Heart Rate (Exercise) 133 bpm -- 137 bpm 132 bpm    Heart Rate (Exit) 83 bpm -- 110 bpm 109 bpm    Rating of Perceived Exertion (Exercise) 15 -- 16 16    Symptoms none -- none none    Duration Continue with 30 min of aerobic exercise without signs/symptoms of physical distress. -- Continue with 30 min of aerobic  exercise without signs/symptoms of physical distress. Continue with 30 min of aerobic exercise without signs/symptoms of physical distress.    Intensity THRR unchanged -- THRR unchanged THRR unchanged         Progression   Progression Continue to progress workloads to maintain intensity without signs/symptoms of physical distress. -- Continue to progress workloads to maintain intensity without signs/symptoms of physical distress. Continue to progress workloads to maintain intensity without signs/symptoms of physical distress.    Average METs 4.55 -- 4.5 4.99         Resistance Training   Training Prescription Yes -- Yes Yes    Weight 6 lb -- 6 lb 6 lb    Reps 10-15 -- 10-15 10-15         Interval Training   Interval Training No -- No No         Treadmill   MPH 3.4 -- 3.4 3.4    Grade 2 -- 2 2    Minutes 15 -- 15 15    METs 4.54 -- 4.54 4.54         Elliptical   Level 1 -- 1 1    Speed 3.5 -- -- --    Minutes 15 -- 15 15    METs 3.1 -- -- --         REL-XR   Level 5 -- -- 6    Minutes 15 -- -- 15    METs 6 -- -- 7.4         Home Exercise Plan   Plans to continue exercise at -- Home (comment)  walking, treadmill, staff videos Home (comment)  walking, treadmill, staff videos Home (comment)  walking, treadmill, staff videos    Frequency -- Add 2 additional days to program exercise sessions. Add 2 additional days to program exercise sessions. Add 2 additional days to program exercise sessions.    Initial Home Exercises Provided -- 04/06/21 04/06/21 04/06/21             Exercise Comments:   Exercise Comments     Row Name 03/31/21 0725 06/22/21 0729         Exercise Comments First full day of exercise!  Patient was oriented to gym and equipment including functions, settings, policies, and procedures.  Patient's individual exercise prescription and treatment plan were reviewed.  All starting workloads were established based on the results of the 6 minute walk test  done at initial orientation visit.  The plan for exercise progression was also introduced and progression will be customized based on patient's performance and goals. [name] graduated today from  rehab with 36 sessions completed.  Details of the patient's exercise prescription and what She needs to do in order to continue the prescription and progress were discussed with patient.  Patient was given a copy  of prescription and goals.  Patient verbalized understanding.  [name] plans to continue to exercise by walking, using the treadmill and staff videos.               Exercise Goals and Review:   Exercise Goals     Row Name 03/30/21 1355             Exercise Goals   Increase Physical Activity Yes       Intervention Provide advice, education, support and counseling about physical activity/exercise needs.;Develop an individualized exercise prescription for aerobic and resistive training based on initial evaluation findings, risk stratification, comorbidities and participant's personal goals.       Expected Outcomes Short Term: Attend rehab on a regular basis to increase amount of physical activity.;Long Term: Add in home exercise to make exercise part of routine and to increase amount of physical activity.;Long Term: Exercising regularly at least 3-5 days a week.       Increase Strength and Stamina Yes       Intervention Provide advice, education, support and counseling about physical activity/exercise needs.;Develop an individualized exercise prescription for aerobic and resistive training based on initial evaluation findings, risk stratification, comorbidities and participant's personal goals.       Expected Outcomes Short Term: Increase workloads from initial exercise prescription for resistance, speed, and METs.;Short Term: Perform resistance training exercises routinely during rehab and add in resistance training at home;Long Term: Improve cardiorespiratory fitness, muscular  endurance and strength as measured by increased METs and functional capacity (6MWT)       Able to understand and use rate of perceived exertion (RPE) scale Yes       Intervention Provide education and explanation on how to use RPE scale       Expected Outcomes Short Term: Able to use RPE daily in rehab to express subjective intensity level;Long Term:  Able to use RPE to guide intensity level when exercising independently       Able to understand and use Dyspnea scale Yes       Intervention Provide education and explanation on how to use Dyspnea scale       Expected Outcomes Short Term: Able to use Dyspnea scale daily in rehab to express subjective sense of shortness of breath during exertion;Long Term: Able to use Dyspnea scale to guide intensity level when exercising independently       Knowledge and understanding of Target Heart Rate Range (THRR) Yes       Intervention Provide education and explanation of THRR including how the numbers were predicted and where they are located for reference       Expected Outcomes Short Term: Able to state/look up THRR;Long Term: Able to use THRR to govern intensity when exercising independently;Short Term: Able to use daily as guideline for intensity in rehab       Able to check pulse independently Yes       Intervention Provide education and demonstration on how to check pulse in carotid and radial arteries.;Review the importance of being able to check your own pulse for safety during independent exercise       Expected Outcomes Short Term: Able to explain why pulse checking is important during independent exercise;Long Term: Able to check pulse independently and accurately       Understanding of Exercise Prescription Yes       Intervention Provide education, explanation, and written materials on patient's individual exercise prescription       Expected Outcomes Short Term:  Able to explain program exercise prescription;Long Term: Able to explain home exercise  prescription to exercise independently                Exercise Goals Re-Evaluation :  Exercise Goals Re-Evaluation     Row Name 03/31/21 0725 04/06/21 0915 04/11/21 1148 04/26/21 1435 05/02/21 0756     Exercise Goal Re-Evaluation   Exercise Goals Review Able to understand and use rate of perceived exertion (RPE) scale;Increase Physical Activity;Knowledge and understanding of Target Heart Rate Range (THRR);Understanding of Exercise Prescription;Increase Strength and Stamina;Able to understand and use Dyspnea scale;Able to check pulse independently Increase Physical Activity;Increase Strength and Stamina;Able to understand and use rate of perceived exertion (RPE) scale;Able to understand and use Dyspnea scale;Knowledge and understanding of Target Heart Rate Range (THRR);Able to check pulse independently;Understanding of Exercise Prescription Increase Physical Activity;Increase Strength and Stamina Increase Physical Activity;Increase Strength and Stamina;Understanding of Exercise Prescription --   Comments Reviewed RPE and dyspnea scales, THR and program prescription with pt today.  Pt voiced understanding and was given a copy of goals to take home. Reviewed home exercise with pt today.  Pt plans to walk and use parents' treadmill at home for exercise.  Reviewed THR, pulse, RPE, sign and symptoms, pulse oximetery and when to call 911 or MD.  Also discussed weather considerations and indoor options.  Pt voiced understanding. [name] is doing well with exercise. She attends consistently.  We will monitor progress. [name] is doing well in rehab.  She is up to 6 lb weights and building up her stamina on the elliptical.  We will continue to monitor her progress. [name]  has been on vacation the past week.  She walked 3.5-7 miles per day.   Expected Outcomes Short: Use RPE daily to regulate intensity. Long: Follow program prescription in THR. Short: Add in more exercise at home and walk while on  vacation over weekend Long: Continue to improve stamina Short:  continue to exercise consistently Long:  improve stamina and MET level Short: Continue to increase workloads Long; Conitnue to improve on elliptical Short: continue to follow program prescription  Long:  build overall stamina    Row Name 05/12/21 0807 05/25/21 1348 05/27/21 0739 06/07/21 1331 06/20/21 1207     Exercise Goal Re-Evaluation   Exercise Goals Review Increase Physical Activity;Increase Strength and Stamina;Understanding of Exercise Prescription Increase Physical Activity;Increase Strength and Stamina;Understanding of Exercise Prescription Increase Physical Activity;Increase Strength and Stamina;Understanding of Exercise Prescription -- Increase Physical Activity;Increase Strength and Stamina;Understanding of Exercise Prescription   Comments [name] is continuing to do well in rehab. She is continuing to work within her THR zone and RPE remain in appropriate range. [name] is doing well in rehab.  She is up to 3.1 METs on the elliptical.  We will continue to monitor her progress. [name] is now enjoying cardio and will feel better after a cardio workout at gym versus just resistance training.  She likes to do 30 min of cardio and then uses our weight routine.  We talked about starting to add in intervals to help give her an extra boost as well. [name] is doing very well with exercise and does the TM and ellpitical in class.  Staff will follow up with interval training to see how that is going for her [name] continues to do well. She increased her post 6MWT by 46%! XR is now up to level 6- she should be getting close to graduation and will continue to monitor up until then.  Expected Outcomes Short: Continue to work up tolerance on elliptical Long: Continue to increase overall MET level Short: Continue to build up stamina on elliptical Long: Continue to improve overall stamina Short: Try intervals Long; continue to exercise  on off days. Short: contineu or start intervals Long: become independent with exercise Short: Graduate Long: Exercise independently at home at appropriate prescription            Discharge Exercise Prescription (Final Exercise Prescription Changes):  Exercise Prescription Changes - 06/20/21 1200       Response to Exercise   Blood Pressure (Admit) 108/60    Blood Pressure (Exit) 104/60    Heart Rate (Admit) 82 bpm    Heart Rate (Exercise) 132 bpm    Heart Rate (Exit) 109 bpm    Rating of Perceived Exertion (Exercise) 16    Symptoms none    Duration Continue with 30 min of aerobic exercise without signs/symptoms of physical distress.    Intensity THRR unchanged      Progression   Progression Continue to progress workloads to maintain intensity without signs/symptoms of physical distress.    Average METs 4.99      Resistance Training   Training Prescription Yes    Weight 6 lb    Reps 10-15      Interval Training   Interval Training No      Treadmill   MPH 3.4    Grade 2    Minutes 15    METs 4.54      Elliptical   Level 1    Minutes 15      REL-XR   Level 6    Minutes 15    METs 7.4      Home Exercise Plan   Plans to continue exercise at Home (comment)   walking, treadmill, staff videos   Frequency Add 2 additional days to program exercise sessions.    Initial Home Exercises Provided 04/06/21             Nutrition:  Target Goals: Understanding of nutrition guidelines, daily intake of sodium <1553m, cholesterol <2081m calories 30% from fat and 7% or less from saturated fats, daily to have 5 or more servings of fruits and vegetables.  Education: All About Nutrition: -Group instruction provided by verbal, written material, interactive activities, discussions, models, and posters to present general guidelines for heart healthy nutrition including fat, fiber, MyPlate, the role of sodium in heart healthy nutrition, utilization of the nutrition label, and  utilization of this knowledge for meal planning. Follow up email sent as well. Written material given at graduation. [facility]  Date 05/18/21  [facility] Code 1- Verbalizes Understanding       Biometrics:  Pre Biometrics - 03/30/21 1355       Pre Biometrics   Height 5' 7.5" (1.715 m)    Weight 180 lb 4.8 oz (81.8 kg)    BMI (Calculated) 27.81    Single Leg Stand 30 seconds             Post Biometrics - 06/10/21 0819        Post  Biometrics   Height 5' 7.5" (1.715 m)    Weight 183 lb 8 oz (83.2 kg)    BMI (Calculated) 28.3    Single Leg Stand 30 seconds             Nutrition Therapy Plan and Nutrition Goals:  Nutrition  Therapy & Goals - 04/14/21 1141       Nutrition Therapy   Diet Heart healthy, low Na    Protein (specify units) 65g    Fiber 25 grams    Whole Grain Foods 3 servings    Saturated Fats 12 max. 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 She feels she doesn't need to review any education, but would like to discuss easy ways to get back into her normal eating routine. Discussed ingredient prepping and using ingredients that could be used in a variety of ways especially as leftovers.      Intervention Plan   Intervention Prescribe, educate and counsel regarding individualized specific dietary modifications aiming towards targeted core components such as weight, hypertension, lipid management, diabetes, heart failure and other comorbidities.    Expected Outcomes Short Term Goal: Understand basic principles of dietary content, such as calories, fat, sodium, cholesterol and  nutrients.;Short Term Goal: A plan has been developed with personal nutrition goals set during dietitian appointment.;Long Term Goal: Adherence to prescribed nutrition plan.             Nutrition Assessments:  MEDIFICTS Score Key: ?70 Need to make dietary changes  40-70 Heart Healthy Diet ? 40 Therapeutic Level Cholesterol Diet  Flowsheet Row Cardiac Rehab from 06/22/2021 in [facility] Cardiac and Pulmonary Rehab  Picture Your Plate Total Score on Admission 78  Picture Your Plate Total Score on Discharge 73      Picture Your Plate Scores: <90 Unhealthy dietary pattern with much room for improvement. 41-50 Dietary pattern unlikely to meet recommendations for good health and room for improvement. 51-60 More healthful dietary pattern, with some room for improvement.  >60 Healthy dietary pattern, although there may be some specific behaviors that could be improved.    Nutrition Goals Re-Evaluation:  Nutrition Goals Re-Evaluation     Row Name 05/02/21 0801 05/27/21 0823           Goals   Nutrition Goal -- Heart healthy and variety      Comment Tyauna has tried a couple bowls as suggested by [employer].  She monitors sodium Shawnise is back to eating more salads and enjoying mixing them up differently each day.  She is aiming for a good variety and watching her sodium.  She no longer feels limited in what she eats or avoids anything, she just uses moderation to help.  She is also currently the main cook in the house and enjoys it.  She has gotten back into better habits of meal planning versus grab and go.      Expected Outcome ST;  check with Melissa about amount of sodium Long: maintain heart healthy diet Short: Continue to cook a variety Long: Continue to focus on healthy eating overall               Nutrition Goals Discharge (Final Nutrition Goals Re-Evaluation):  Nutrition Goals Re-Evaluation - 05/27/21 0823       Goals   Nutrition Goal Heart healthy and variety     Comment [name] is back to eating more salads and enjoying mixing them up differently each day.  She is aiming for  a good variety and watching her sodium.  She no longer feels limited in what she eats or avoids anything, she just uses moderation to help.  She is also currently the main cook in the house and enjoys it.  She has gotten back into better habits of meal planning versus grab and go.    Expected Outcome Short: Continue to cook a variety Long: Continue to focus on healthy eating overall             Psychosocial: Target Goals: Acknowledge presence or absence of significant depression and/or stress, maximize coping skills, provide positive support system. Participant is able to verbalize types and ability to use techniques and skills needed for reducing stress and depression.   Education: Stress, Anxiety, and Depression - Group verbal and visual presentation to define topics covered.  Reviews how body is impacted by stress, anxiety, and depression.  Also discusses healthy ways to reduce stress and to treat/manage anxiety and depression.  Written material given at graduation. Flowsheet Row Cardiac Rehab from 06/15/2021 in [facility] Cardiac and Pulmonary Rehab  Date 06/15/21  Educator [facility]  Instruction Review Code 1- Verbalizes Understanding       Education: Sleep Hygiene -Provides group verbal and written instruction about how sleep can affect your health.  Define sleep hygiene, discuss sleep cycles and impact of sleep habits. Review good sleep hygiene tips.    Initial Review & Psychosocial Screening:  Initial Psych Review & Screening - 03/28/21 1126       Initial Review   Current issues with Current Stress Concerns   History of generalized Anxiety   Source of Stress Concerns Unable to participate in former interests or hobbies    Comments Home from school in [place] program is virtual . 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      Family Dynamics   Good Support  System? Yes   DIrect family.  Mom and Dad and younger sister , boyfrient, best frient in [place], sister not living at home     Barriers   Psychosocial barriers to participate in program There are no identifiable barriers or psychosocial needs.;The patient should benefit from training in stress management and relaxation.      Screening Interventions   Interventions Encouraged to exercise    Expected Outcomes Short Term goal: Utilizing psychosocial counselor, staff and physician to assist with identification of specific Stressors or current issues interfering with healing process. Setting desired goal for each stressor or current issue identified.;Long Term Goal: Stressors or current issues are controlled or eliminated.;Short Term goal: Identification and review with participant of any Quality of Life or Depression concerns found by scoring the questionnaire.;Long Term goal: The participant improves quality of Life and PHQ9 Scores as seen by post scores and/or verbalization of changes             Quality of Life Scores:   Quality of Life - 06/22/21 0751       Quality of Life Scores   Health/Function Pre 12.13 %    Health/Function Post 25.3 %    Health/Function % Change 108.57 %    Socioeconomic Pre 25.56 %    Socioeconomic Post 28.5 %    Socioeconomic % Change  11.5 %    Psych/Spiritual Pre 18.71 %    Psych/Spiritual Post 24.43 %    Psych/Spiritual % Change 30.57 %    Family Pre 19.8 %    Family Post 27.6 %    Family % Change 39.39 %  GLOBAL Pre 17.61 %    GLOBAL Post 26.19 %    GLOBAL % Change 48.72 %            Scores of 19 and below usually indicate a poorer quality of life in these areas.  A difference of  2-3 points is a clinically meaningful difference.  A difference of 2-3 points in the total score of the Quality of Life Index has been associated with significant improvement in overall quality of life, self-image, physical symptoms, and general health in studies assessing  change in quality of life.  PHQ-9: Recent Review Flowsheet Data     Depression screen [facility] 2/9 06/22/2021 03/30/2021   Decreased Interest 0 1   Down, Depressed, Hopeless 1  1   PHQ - 2 Score 1 2   Altered sleeping 0 0   Tired, decreased energy 0 1   Change in appetite 0 1   Feeling bad or failure about yourself  0 0   Trouble concentrating 0 0   Moving slowly or fidgety/restless 0 0   Suicidal thoughts 0 0   PHQ-9 Score 1 4   Difficult doing work/chores Not difficult at all Somewhat difficult      Interpretation of Total Score  Total Score Depression Severity:  1-4 = Minimal depression, 5-9 = Mild depression, 10-14 = Moderate depression, 15-19 = Moderately severe depression, 20-27 = Severe depression   Psychosocial Evaluation and Intervention:  Psychosocial Evaluation - 03/28/21 1144       Psychosocial Evaluation & Interventions   Interventions Encouraged to exercise with the program and follow exercise prescription    Comments [name] has no barriers to attending the program. [facility] lives with her parents and younger suister at the [place]. She has come home for her heart failure treatment and will be in [place] for awhile. Her support system is good:  Besides her family at home she has a boyfrientd in [place] she goes to school. She has a best friend and her other sister. 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 She is seeing a counselor regarding all the health issues and how it is effecting her at the moment. She is not on any meds for depression nor anxiety. She has a history of some generalized anxiety and was taken off the meds she was taking as it may have effected her heart rhythm. She is doing well without the meds at this moment. She is ready to get started and wants to learn all she can to become as healthy as she can to get back to her routine and  previous life activities. She should do well in the program    Expected Outcomes STG She attends all scheduled sessions, she continues to see her counselor as needed. LTG: She is able to continue to progress to return to her previous level of activities.    Continue Psychosocial Services  Follow up required by staff             Psychosocial Re-Evaluation:  Psychosocial Re-Evaluation     [place] Name 05/02/21 0758 05/27/21 0731 06/15/21 0732         Psychosocial Re-Evaluation   Current issues with Current Stress Concerns;Current Sleep Concerns Current Stress Concerns;Current Anxiety/Panic Current Stress Concerns     Comments [name] was seeing a therapist but it wasnt a good fit - she feels good  and doesnt feel like its an urgent need but plans to find a new therapist. Not being able to drive is hard but she states her family is great and helps her out. She sleeps well. [name] had a rough day yesterday.  She forgot to take her morning meds and when she realized it, it triggered her anxiety.  As such, she had chest pain and dizzy spells all day yesterday. Her pressures were normal at the end of the day. She took morning at noon and night time about an hour later than normal.  Today, she is still having some dizziness, but better than yesterday and no chest pain.   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  We talked about setting up a plan to check in with her friends and neighbors to help.  She was seeing a therapist but his style was not meshing, and so she is going to look into Talk or another therapist up there.  She is also going to talk with Dr. Haroldine Laws about getting back on her anxiety meds.  She is sleeping well. She is trying to make plans to stay calm. 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 She is concerned about the prognosis for herself, family,  and future family. She states she has support from her family concerning this new development.     Expected Outcomes Short: continue to exercise to help manage stres Long:  manage stress long term Short: Talk to doctor about anxiety meds and find therapist that works.  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 She is concerned about the prognosis for herself, family, and future family. She states she has support from her family concerning this new development.    Interventions Stress management education      Initial Review   Source of Stress Concerns Chronic Illness             Vocational Rehabilitation: Provide vocational rehab assistance to qualifying candidates.   Vocational Rehab Evaluation & Intervention:  Vocational Rehab - 03/28/21 1136       Initial Vocational Rehab Evaluation & Intervention   Assessment shows need for Vocational Rehabilitation No             Education: Education Goals: Education classes will be provided on a variety of topics geared toward better understanding of heart health and risk factor modification. Participant will state understanding/return demonstration of topics presented as noted by education test scores.  Learning Barriers/Preferences:  Learning Barriers/Preferences - 03/28/21 1135       Learning Barriers/Preferences   Learning Barriers None    Learning Preferences None             General Cardiac Education Topics:  AED/CPR: - Group verbal and written instruction with the use  of models to demonstrate the basic use  of the AED with the basic ABC's of resuscitation.   Anatomy and Cardiac Procedures: - Group verbal and visual presentation and models provide information about basic cardiac anatomy and function. Reviews the testing methods done to diagnose heart disease and the outcomes of the test results. Describes the treatment choices: Medical Management, Angioplasty, or Coronary Bypass Surgery for treating various heart conditions including Myocardial Infarction, Angina, Valve Disease, and Cardiac Arrhythmias.  Written material given at graduation. Flowsheet Row Cardiac Rehab from 06/15/2021 in [facility] Cardiac and Pulmonary Rehab  Date 05/04/21  Educator SB  Instruction Review Code 1- Verbalizes Understanding       Medication Safety: - Group verbal and visual instruction to review commonly prescribed medications for heart and lung disease. Reviews the medication, class of the drug, and side effects. Includes the steps to properly store meds and maintain the prescription regimen.  Written material given at graduation. Flowsheet Row Cardiac Rehab from 06/15/2021 in [facility] Cardiac and Pulmonary Rehab  Date 05/25/21  Educator SB  Instruction Review Code 1- Verbalizes Understanding       Intimacy: - Group verbal instruction through game format to discuss how heart and lung disease can affect sexual intimacy. Written material given at graduation..   Know Your Numbers and Heart Failure: - Group verbal and visual instruction to discuss disease risk factors for cardiac and pulmonary disease and treatment options.  Reviews associated critical values for Overweight/Obesity, Hypertension, Cholesterol, and Diabetes.  Discusses basics of heart failure: signs/symptoms and treatments.  Introduces Heart Failure Zone chart for action plan for heart failure.  Written material given at graduation. Flowsheet Row Cardiac Rehab from 06/15/2021 in [facility] Cardiac and Pulmonary Rehab  Date  03/31/21  Educator SB  Instruction Review Code 1- Verbalizes Understanding       Infection Prevention: - Provides verbal and written material to individual with discussion of infection control including proper hand washing and proper equipment cleaning during exercise session. Flowsheet Row Cardiac Rehab from 06/15/2021 in [facility] Cardiac and Pulmonary Rehab  Date 03/30/21  Educator [facility]  Instruction Review Code 1- Verbalizes Understanding       Falls Prevention: - Provides verbal and written material to individual with discussion of falls prevention and safety. Flowsheet Row Cardiac Rehab from 06/15/2021 in [facility] Cardiac and Pulmonary Rehab  Date 03/28/21  Educator SB  Instruction Review Code 1- Verbalizes Understanding       Other: -Provides group and verbal instruction on various topics (see comments)   Knowledge Questionnaire Score:  Knowledge Questionnaire Score - 06/22/21 0751       Knowledge Questionnaire Score   Pre Score 26/26    Post Score 26/26             Core Components/Risk Factors/Patient Goals at Admission:  Personal Goals and Risk Factors at Admission - 03/30/21 1357       Core Components/Risk Factors/Patient Goals on Admission    Weight Management Yes;Weight Loss    Intervention Weight Management: Develop a combined nutrition and exercise program designed to reach desired caloric intake, while maintaining appropriate intake of nutrient and fiber, sodium and fats, and appropriate energy expenditure required for the weight goal.;Weight Management: Provide education and appropriate resources to help participant work on and attain dietary goals.;Weight Management/Obesity: Establish reasonable short term and long term weight goals.    Admit Weight 180 lb 4.8 oz (81.8 kg)    Goal Weight: Short Term 175 lb (79.4 kg)    Goal Weight: Long Term 175 lb (79.4 kg)  Expected Outcomes Short Term: Continue to assess and modify interventions until short term weight  is achieved;Long Term: Adherence to nutrition and physical activity/exercise program aimed toward attainment of established weight goal;Weight Loss: Understanding of general recommendations for a balanced deficit meal plan, which promotes 1-2 lb weight loss per week and includes a negative energy balance of [phone] kcal/d;Understanding recommendations for meals to include 15-35% energy as protein, 25-35% energy from fat, 35-60% energy from carbohydrates, less than 268m of dietary cholesterol, 20-35 gm of total fiber daily;Understanding of distribution of calorie intake throughout the day with the consumption of 4-5 meals/snacks    Heart Failure Yes    Intervention Provide a combined exercise and nutrition program that is supplemented with education, support and counseling about heart failure. Directed toward relieving symptoms such as shortness of breath, decreased exercise tolerance, and extremity edema.    Expected Outcomes Improve functional capacity of life;Short term: Attendance in program 2-3 days a week with increased exercise capacity. Reported lower sodium intake. Reported increased fruit and vegetable intake. Reports medication compliance.;Short term: Daily weights obtained and reported for increase. Utilizing diuretic protocols set by physician.;Long term: Adoption of self-care skills and reduction of barriers for early signs and symptoms recognition and intervention leading to self-care maintenance.             Education:Diabetes - Individual verbal and written instruction to review signs/symptoms of diabetes, desired ranges of glucose level fasting, after meals and with exercise. Acknowledge that pre and post exercise glucose checks will be done for 3 sessions at entry of program.   Core Components/Risk Factors/Patient Goals Review:   Goals and Risk Factor Review     Row Name 05/02/21 0752 05/27/21 0736           Core Components/Risk Factors/Patient Goals Review   Personal  Goals Review Weight Management/Obesity;Heart Failure Weight Management/Obesity;Heart Failure      Review [name] monitors weight daily and reports no signs or symtpoms of heat failure.  Weight has stayed steady.  She sees Dr. BHaroldine Lawstoday and will ask about feeling fatigued and having muscle twitches after exercise.  She plans to ask Dr about these symptoms. JPriscilleis doing well in rehab.  Her weight is holding steady around 180-182 lb.  She would like to Sweeney weight but would like to focus more on life style change versus just losing weight.  She is staying on top of her heart failure.  She had a rough day yesterday but feeling better today.  Her anxiety is still a struggle and flared her pressure some for her today.  v      Expected Outcomes Short: follow up with Dr today Long: manage heart failure Short: Keep close eye on symptoms next couple of days Long: Conitnue to manage heart failure and work on weight loss.               Core Components/Risk Factors/Patient Goals at Discharge (Final Review):   Goals and Risk Factor Review - 05/27/21 0736       Core Components/Risk Factors/Patient Goals Review   Personal Goals Review Weight Management/Obesity;Heart Failure    Review JLadelleis doing well in rehab.  Her weight is holding steady around 180-182 lb.  She would like to Sweeney weight but would like to focus more on life style change versus just losing weight.  She is staying on top of her heart failure.  She had a rough day yesterday but feeling better today.  Her anxiety is still  a struggle and flared her pressure some for her today.  v    Expected Outcomes Short: Keep close eye on symptoms next couple of days Long: Conitnue to manage heart failure and work on weight loss.             ITP Comments:  ITP Comments     Row Name 03/28/21 1144 03/30/21 1351 03/31/21 0724 04/20/21 0735 05/18/21 0714   ITP Comments Virtual orientation call completed today. shehas an appointment on  Date: 03/30/2021 for EP eval and gym Orientation.  Documentation of diagnosis [facility] be found in [facility] Date: 02/27/2021. Completed 6MWT and gym orientation. Initial ITP created and sent for review to Dr. Emily Filbert, Medical Director. First full day of exercise!  Patient was oriented to gym and equipment including functions, settings, policies, and procedures.  Patient's individual exercise prescription and treatment plan were reviewed.  All starting workloads were established based on the results of the 6 minute walk test done at initial orientation visit.  The plan for exercise progression was also introduced and progression will be customized based on patient's performance and goals. 30 Day review completed. Medical Director ITP review done, changes made as directed, and signed approval by Medical Director.   new to program 30 Day review completed. Medical Director ITP review done, changes made as directed, and signed approval by Medical Director.    [address] Name 05/27/21 0731 06/15/21 0719 06/15/21 0731 06/22/21 0730     ITP Comments Geni Bers had a rough day yesterday.  She forgot to take her morning meds and when she realized it, it triggered her anxiety.  As such, she had chest pain and dizzy spells all day yesterday. Her pressures were normal at the end of the day. She took morning at noon and night time about an hour later than normal.  Today, she is still having some dizziness, but better than yesterday and no chest pain. 30 Day review completed. Medical Director ITP review done, changes made as directed, and signed approval by Medical Director. 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 She is concerned about the prognosis for herself, family, and future family. She states she has support from her family concerning this new development. Jamelah graduated today from  rehab with 36 sessions completed.  Details of the patient's exercise  prescription and what She needs to do in order to continue the prescription and progress were discussed with patient.  Patient was given a copy of prescription and goals.  Patient verbalized understanding.  Hira plans to continue to exercise by walking, using the treadmill and staff videos.             Comments: discharge ITP

## 2021-06-22 NOTE — Progress Notes (Signed)
Daily Session Note  Patient Details  Name: [name] MRN: [id number] Date of Birth: 04-16-1996 Referring Provider:   Flowsheet Row Cardiac Rehab from 03/30/2021 in [facility] Cardiac and Pulmonary Rehab  Referring Provider Mary Bickers MD       Encounter Date: 06/22/2021  Check In:  Session Check In - 06/22/21 0728       Check-In   Supervising physician immediately available to respond to emergencies See telemetry face sheet for immediately available ER MD    Location [facility]-Cardiac & Pulmonary Rehab    Staff Present [name], MPA, RN;[name], BS, RRT, CPFT;[name], [place]    Virtual Visit No    Medication changes reported     No    Fall or balance concerns reported    No    Warm-up and Cool-down Performed on first and last piece of equipment    Resistance Training Performed Yes    VAD Patient? No    PAD/SET Patient? No      Pain Assessment   Currently in Pain? No/denies                Social History   Tobacco Use  Smoking Status Never  Smokeless Tobacco Never    Goals Met:  Independence with exercise equipment Exercise tolerated well No report of cardiac concerns or symptoms Strength training completed today  Goals Unmet:  Not Applicable  Comments:  [name] graduated today from  rehab with 36 sessions completed.  Details of the patient's exercise prescription and what She needs to do in order to continue the prescription and progress were discussed with patient.  Patient was given a copy of prescription and goals.  Patient verbalized understanding.  [name] plans to continue to exercise by walking, using the treadmill and staff videos.    Dr. Emily Sweeney is Medical Director for [facility].  Dr. Ottie Sweeney is Medical Director for [facility] Pulmonary Rehabilitation.

## 2021-06-22 NOTE — Progress Notes (Signed)
Post-test Genetic Consultation  [name] is here today with her mother, [name] for her post-test genetic consult of NICM. She reports having a 3 month follow-up for her ICD and is scheduled to see Dr. Gala Romney on Aug 24th. She reports doing well st cardiac rehab and informs me that she had a 46% improvement in her 6 minute walk test. She is attending school virtually for now and plans to be in person for Fall semester. She reports no major changes to her family history. Her father is taking medication for HTN and HL. Her 21 y.o sister has been experiencing dizziness for the last 24 years and was diagnosed with POTS. She had her wellness visit 2 months ago and the pediatrician was not in favor of the POTS diagnosis. She underwent screening- had a normal EKG, echocardiogram was not done.  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 I informed them that this variant is well documented in several patients with Dilated Cardiomyopathy (DCM) with patients typically having an early age of diagnosis, end stage heart failure and at risk of adverse events. I explained to them that this variant is likely inherited from one of the parents and has been shown to be associated with incomplete penetrance ranging from 96% to 66%- i.e. nearly all members of the family harboring the mutation have presented clinically with symptoms to nearly 2/3rd of the relatives with this mutation are symptomatic.   I explained to them that mutations in RBM20 gene leads to loss of function that subsequently affects splicing of cardiac genes, thereby resulting in DCM, a condition that weakens the cardiac muscles.  Since mutations in RBM20 are inherited in an autosomal dominant manner and the rate of de novo mutations in RBM20 is very low, it is highly likely that she has inherited this from pathogenic variant from one of her parents.  Both parents are asymptomatic- this can  be attributed to incomplete penetrance that has been documented in some relatives of an affected family. It is recommended that her first-degree relatives, namely both parents and her two younger sisters undergo genetic testing for the familial RBM20 pathogenic variant to assess their risk of having DCM. They verbalized understanding of this.  In addition, I reiterated the protections afforded by the Genetic Information Non-Discrimination Act (GINA).   [name], Ph.D, [facility] Clinical Molecular Geneticist

## 2021-06-29 ENCOUNTER — Ambulatory Visit (HOSPITAL_COMMUNITY)
Admission: RE | Admit: 2021-06-29 | Discharge: 2021-06-29 | Disposition: A | Discharge status: Home / Self Care | Payer: BC Managed Care – PPO | Source: Ambulatory Visit | Attending: Internal Medicine | Admitting: Internal Medicine

## 2021-06-29 ENCOUNTER — Other Ambulatory Visit: Payer: Self-pay

## 2021-06-29 ENCOUNTER — Ambulatory Visit (HOSPITAL_BASED_OUTPATIENT_CLINIC_OR_DEPARTMENT_OTHER)
Admission: RE | Admit: 2021-06-29 | Discharge: 2021-06-29 | Disposition: A | Discharge status: Home / Self Care | Payer: BC Managed Care – PPO | Source: Ambulatory Visit | Attending: Internal Medicine | Admitting: Internal Medicine

## 2021-06-29 ENCOUNTER — Encounter (HOSPITAL_COMMUNITY): Payer: Self-pay | Admitting: Internal Medicine

## 2021-06-29 VITALS — BP 102/68 | HR 86 | Wt 184.4 lb

## 2021-06-29 DIAGNOSIS — I340 Nonrheumatic mitral (valve) insufficiency: Secondary | ICD-10-CM | POA: Diagnosis not present

## 2021-06-29 DIAGNOSIS — I428 Other cardiomyopathies: Secondary | ICD-10-CM | POA: Diagnosis not present

## 2021-06-29 DIAGNOSIS — M7989 Other specified soft tissue disorders: Secondary | ICD-10-CM

## 2021-06-29 DIAGNOSIS — I4721 Torsades de pointes: Secondary | ICD-10-CM

## 2021-06-29 DIAGNOSIS — I420 Dilated cardiomyopathy: Secondary | ICD-10-CM | POA: Diagnosis not present

## 2021-06-29 DIAGNOSIS — I472 Ventricular tachycardia: Secondary | ICD-10-CM | POA: Diagnosis not present

## 2021-06-29 DIAGNOSIS — I5022 Chronic systolic (congestive) heart failure: Secondary | ICD-10-CM | POA: Diagnosis present

## 2021-06-29 LAB — ECHOCARDIOGRAM COMPLETE
Area-P 1/2: 2.69 cm2
Calc EF: 31.4 %
MV M vel: 4.71 m/s
MV Peak grad: 88.7 mmHg
Radius: 0.3 cm
S' Lateral: 5.6 cm
Single Plane A2C EF: 31.8 %
Single Plane A4C EF: 30.9 %

## 2021-06-29 LAB — BASIC METABOLIC PANEL
Anion gap: 7 (ref 5–15)
BUN: 12 mg/dL (ref 6–20)
CO2: 25 mmol/L (ref 22–32)
Calcium: 8.9 mg/dL (ref 8.9–10.3)
Chloride: 103 mmol/L (ref 98–111)
Creatinine, Ser: 0.72 mg/dL (ref 0.44–1.00)
GFR, Estimated: >60 mL/min (ref >60)
Glucose, Bld: 81 mg/dL (ref 70–99)
Potassium: 4.2 mmol/L (ref 3.5–5.1)
Sodium: 135 mmol/L (ref 135–145)

## 2021-06-29 LAB — BRAIN NATRIURETIC PEPTIDE: B Natriuretic Peptide: 83.7 pg/mL (ref 0.0–100.0)

## 2021-06-29 MED ORDER — FLUCONAZOLE 150 MG PO TABS: 150.0000 mg | ORAL_TABLET | ORAL | 0 refills | Status: DC | PRN

## 2021-06-29 NOTE — Progress Notes (Signed)
ADVANCED HF CLINIC NOTE  PCP: Patient, [name] Primary Cardiologist: Little Ishikawa, MD  HPI:  [name] is a 25 y.o. 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   Had COVID-19 10/21 (vaccianted not boosted).   She presented to the ED in [place] on 11/11/2020 with CP and palpitations. Chest CT no evidence of PE.  Hs trops (72 > 73 > 68).    Echocardiogram 12/03/2020  EF 15-20% normal RV, no significant valvular dz. Holter with rare ectopy and 6 beats run of NSVT. Myoview with anterior defect, Cath 01/10/2021 showed normal coronary arteries, normal LVEDP.     cMRI 01/26/21 LVEF 31% markedly dilated No significant LGE. RV normal. No non-compaction or other pathology  I saw her in 3/22 and placed a Zio patch to quantify PVC burden. ZIo showed multiple short runs VT/TdP. Admitted for ICD. CRT-D placed 02/18/21 with RA lead to overdrive pace given TdP occurred during nighttime bradycardia .  4/22 found to have LUE DVT  Here for f/u with her mom. Has been following with Dr. Jomarie Longs. 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 This is a variant associated with Dilated Cardiomyopathy (DCM) with patients typically having an early age of diagnosis, end stage heart failure and at risk of adverse events. The gene is inherited in an autosomal dominant manner and the rate of de novo mutations in RBM20 is very low. Just graduated CR and now walking on her own. About to start clinicals. No CP or SOB. No edema or PND. SBP 80s at times after CR.  BP ~100/65.  Occasional yeast infections.   Echo 25-30% RV normal Personally reviewed   ROS: All systems negative except as listed in HPI, PMH and Problem List.  SH:  Social History   Socioeconomic History   Marital status: Single    Spouse name: Not on file   Number of children: Not  on file   Years of education: Not on file   Highest education level: Not on file  Occupational History   Not on file  Tobacco Use   Smoking status: Never   Smokeless tobacco: Never  Substance and Sexual Activity   Alcohol use: No   Drug use: No   Sexual activity: Never  Other Topics Concern   Not on file  Social History Narrative   [employer] highschool   10th grade.   volleyball   Social Determinants of Corporate investment banker Strain: Not on file  Food Insecurity: Not on file  Transportation Needs: Not on file  Physical Activity: Not on file  Stress: Not on file  Social Connections: Not on file  Intimate Partner Violence: Not on file    FH:  Family History  Problem Relation Age of Onset   Healthy Mother    Hyperlipidemia Father    Healthy Sister    Healthy Sister    Heart attack Maternal Grandmother    Hyperlipidemia Maternal Grandfather     Past Medical History:  Diagnosis Date   Lactose intolerance    NICM (nonischemic cardiomyopathy) (HCC)    PVC's (premature ventricular contractions)    Torsades de pointes (HCC)     Current Outpatient Medications  Medication Sig Dispense Refill   apixaban (ELIQUIS) 5 MG TABS tablet Take 1 tablet (5 mg total) by mouth 2 (two) times daily.  180 tablet 3   carvedilol (COREG) 6.25 MG tablet Take 1 tablet (6.25 mg total) by mouth in the morning and at bedtime. 180 tablet 3   empagliflozin (JARDIANCE) 10 MG TABS tablet Take 10 mg by mouth daily.     potassium chloride SA (KLOR-CON) 20 MEQ tablet Take 1 tablet (20 mEq total) by mouth daily. 90 tablet 3   sacubitril-valsartan (ENTRESTO) 24-26 MG Take 1 tablet by mouth 2 (two) times daily. 180 tablet 3   sertraline (ZOLOFT) 25 MG tablet Take 25 mg by mouth daily.     spironolactone (ALDACTONE) 25 MG tablet Take 0.5 tablets (12.5 mg total) by mouth at bedtime. 45 tablet 3   TRI-ESTARYLLA 0.18/0.215/0.25 MG-35 MCG tablet Take 1 tablet by mouth daily.     valACYclovir (VALTREX)  1000 MG tablet Take 1,000 mg by mouth as needed for rash.     No current facility-administered medications for this encounter.    Vitals:   06/29/21 1506  BP: 102/68  Pulse: 86  SpO2: 98%  Weight: 83.6 kg (184 lb 6.4 oz)    PHYSICAL EXAM: General:  Well appearing. No resp difficulty HEENT: normal Neck: supple. no JVD. Carotids 2+ bilat; no bruits. No lymphadenopathy or thryomegaly appreciated. Cor: PMI nondisplaced. Regular rate & rhythm. No rubs, gallops or murmurs. Lungs: clear Abdomen: soft, nontender, nondistended. No hepatosplenomegaly. No bruits or masses. Good bowel sounds. Extremities: no cyanosis, clubbing, rash, edema Neuro: alert & orientedx3, cranial nerves grossly intact. moves all 4 extremities w/o difficulty. Affect pleasant   ICD interrogation: ICD personally interrogated HL Score 0. No VT. Volume ok. Personally reviewed   ASSESSMENT & PLAN:  1. Chronic HFrEF - due to NICM. Suspect viral (? COVID) myocarditis - Echo 1/22 EF 15- 20% - [place] showed normal coronary arteries, normal LVEDP.  - cMRI 01/26/21 LVEF 31% markedly dilated No significant LGE. RV normal. No non-compaction or other pathology - Echo 4/22 EF 25% (I felt 30-35%) - Echo today EF 25-30% (stable) - Genetic w/u shows RBM20 gene, namely c.1907G>A, P.Arg636His. This is a variant associated with Dilated Cardiomyopathy (DCM) with patients typically having an early age of diagnosis, end stage heart failure and at risk of adverse events. The gene is inherited in an autosomal dominant manner and the rate of de novo mutations in RBM20 is very low.  - Improved NYHA II  - Volume status looks good - Continue Jardiance 10 - Continue Entresto 24/26 bid - Continue spiro 12.5 - Continue carvedilol 6.25 bid - BP too low to titrate GDMT - Long talk with her and her mother about the ramifications of the genetic findings. Will need to continue close f/u. Will arrange for echo screening of her 2  sisters.   2.Torsades/VT - Zio 3/22 with multiple episodes VT/TdP. PVCs 1% - CRT-D placed 02/18/21 with RA lead to overdrive pace given TdP occurred during nighttime bradycardia. Follwoed by [name]  - ICD interrogation today in Clinic No VT/VF. Fluid ok   3. LUE DVT - u/s 4/22 - On Eliquis. No bleeding.  - Continue   Total time spent 35 minutes. Over half that time spent discussing above.   Arvilla Meres, MD  4:03 PM

## 2021-06-29 NOTE — Progress Notes (Signed)
  Echocardiogram 2D Echocardiogram has been performed.  Augustine Radar 06/29/2021, 3:11 PM

## 2021-06-29 NOTE — Patient Instructions (Signed)
Take Flucanazole 150 mg AS NEEDED for yeast infection, you can also try taking an over the counter Women's Health Probiotic for vaginal/urinary health  Labs done today, your results will be available in MyChart, we will contact you for abnormal readings.  Your physician recommends that you schedule a follow-up appointment in: 3 months  If you have any questions or concerns before your next appointment please send [place] a message through [place] or call our office at [phone].    TO LEAVE A MESSAGE FOR THE NURSE SELECT OPTION 2, PLEASE LEAVE A MESSAGE INCLUDING: YOUR NAME DATE OF BIRTH CALL BACK NUMBER REASON FOR CALL**this is important as we prioritize the call backs  YOU WILL RECEIVE A CALL BACK THE SAME DAY AS LONG AS YOU CALL BEFORE 4:00 PM  At the [facility], you and your health needs are our priority. As part of our continuing mission to provide you with exceptional heart care, we have created designated Provider Care Teams. These Care Teams include your primary Cardiologist (physician) and Advanced Practice Providers (APPs- Physician Assistants and Nurse Practitioners) who all work together to provide you with the care you need, when you need it.   You may see any of the following providers on your designated Care Team at your next follow up: Dr Arvilla Meres Dr Marca Ancona Dr Brandon Melnick, NP Robbie Lis, [place] Mikki Santee Karle Plumber, PharmD   Please be sure to bring in all your medications bottles to every appointment.

## 2021-07-04 ENCOUNTER — Encounter (HOSPITAL_COMMUNITY): Payer: Self-pay

## 2021-07-07 ENCOUNTER — Ambulatory Visit (HOSPITAL_COMMUNITY): Payer: BC Managed Care – PPO

## 2021-07-07 ENCOUNTER — Encounter (HOSPITAL_COMMUNITY): Payer: BC Managed Care – PPO | Admitting: Internal Medicine

## 2021-08-22 ENCOUNTER — Ambulatory Visit (INDEPENDENT_AMBULATORY_CARE_PROVIDER_SITE_OTHER): Payer: BC Managed Care – PPO

## 2021-08-22 DIAGNOSIS — I428 Other cardiomyopathies: Secondary | ICD-10-CM | POA: Diagnosis not present

## 2021-08-23 LAB — CUP PACEART REMOTE DEVICE CHECK
Battery Remaining Longevity: 156 mo
Battery Remaining Percentage: 100 %
Brady Statistic RA Percent Paced: 47 %
Brady Statistic RV Percent Paced: 0 %
Date Time Interrogation Session: 20221017012200
HighPow Impedance: 79 Ohm
Implantable Lead Implant Date: 20220415
Implantable Lead Implant Date: 20220415
Implantable Lead Location: 753859
Implantable Lead Location: 753860
Implantable Lead Model: 672
Implantable Lead Model: 7841
Implantable Lead Serial Number: 1134026
Implantable Lead Serial Number: 165867
Implantable Pulse Generator Implant Date: 20220415
Lead Channel Impedance Value: 577 Ohm
Lead Channel Impedance Value: 724 Ohm
Lead Channel Setting Pacing Amplitude: 2 V
Lead Channel Setting Pacing Amplitude: 2.5 V
Lead Channel Setting Pacing Pulse Width: 0.4 ms
Lead Channel Setting Sensing Sensitivity: 0.5 mV
Pulse Gen Serial Number: 597305

## 2021-08-24 ENCOUNTER — Encounter (HOSPITAL_COMMUNITY): Payer: Self-pay

## 2021-08-31 NOTE — Progress Notes (Signed)
Remote ICD transmission.   

## 2021-09-21 ENCOUNTER — Encounter (HOSPITAL_COMMUNITY): Payer: Self-pay

## 2021-10-25 ENCOUNTER — Encounter (HOSPITAL_COMMUNITY): Payer: Self-pay | Admitting: Internal Medicine

## 2021-10-25 ENCOUNTER — Ambulatory Visit (HOSPITAL_COMMUNITY)
Admission: RE | Admit: 2021-10-25 | Discharge: 2021-10-25 | Disposition: A | Discharge status: Home / Self Care | Payer: BC Managed Care – PPO | Source: Ambulatory Visit | Attending: Internal Medicine | Admitting: Internal Medicine

## 2021-10-25 ENCOUNTER — Other Ambulatory Visit: Payer: Self-pay

## 2021-10-25 VITALS — BP 100/60 | HR 78 | Wt 192.2 lb

## 2021-10-25 DIAGNOSIS — Z86718 Personal history of other venous thrombosis and embolism: Secondary | ICD-10-CM | POA: Insufficient documentation

## 2021-10-25 DIAGNOSIS — Z09 Encounter for follow-up examination after completed treatment for conditions other than malignant neoplasm: Secondary | ICD-10-CM | POA: Diagnosis not present

## 2021-10-25 DIAGNOSIS — I5084 End stage heart failure: Secondary | ICD-10-CM | POA: Diagnosis not present

## 2021-10-25 DIAGNOSIS — I4721 Torsades de pointes: Secondary | ICD-10-CM

## 2021-10-25 DIAGNOSIS — Z7901 Long term (current) use of anticoagulants: Secondary | ICD-10-CM | POA: Insufficient documentation

## 2021-10-25 DIAGNOSIS — I420 Dilated cardiomyopathy: Secondary | ICD-10-CM | POA: Insufficient documentation

## 2021-10-25 DIAGNOSIS — Z79899 Other long term (current) drug therapy: Secondary | ICD-10-CM | POA: Insufficient documentation

## 2021-10-25 DIAGNOSIS — I5022 Chronic systolic (congestive) heart failure: Secondary | ICD-10-CM | POA: Insufficient documentation

## 2021-10-25 DIAGNOSIS — Z7984 Long term (current) use of oral hypoglycemic drugs: Secondary | ICD-10-CM | POA: Diagnosis not present

## 2021-10-25 DIAGNOSIS — R002 Palpitations: Secondary | ICD-10-CM | POA: Diagnosis present

## 2021-10-25 DIAGNOSIS — I493 Ventricular premature depolarization: Secondary | ICD-10-CM | POA: Insufficient documentation

## 2021-10-25 DIAGNOSIS — Z8616 Personal history of COVID-19: Secondary | ICD-10-CM | POA: Diagnosis not present

## 2021-10-25 LAB — BASIC METABOLIC PANEL
Anion gap: 6 (ref 5–15)
BUN: 11 mg/dL (ref 6–20)
CO2: 25 mmol/L (ref 22–32)
Calcium: 8.9 mg/dL (ref 8.9–10.3)
Chloride: 105 mmol/L (ref 98–111)
Creatinine, Ser: 0.72 mg/dL (ref 0.44–1.00)
GFR, Estimated: >60 mL/min (ref >60)
Glucose, Bld: 92 mg/dL (ref 70–99)
Potassium: 4.2 mmol/L (ref 3.5–5.1)
Sodium: 136 mmol/L (ref 135–145)

## 2021-10-25 LAB — MAGNESIUM: Magnesium: 2.2 mg/dL (ref 1.7–2.4)

## 2021-10-25 LAB — CBC
HCT: 42.1 % (ref 36.0–46.0)
Hemoglobin: 13.4 g/dL (ref 12.0–15.0)
MCH: 27.9 pg (ref 26.0–34.0)
MCHC: 31.8 g/dL (ref 30.0–36.0)
MCV: 87.7 fL (ref 80.0–100.0)
Platelets: 299 10*3/uL (ref 150–400)
RBC: 4.8 MIL/uL (ref 3.87–5.11)
RDW: 14.4 % (ref 11.5–15.5)
WBC: 7.6 10*3/uL (ref 4.0–10.5)
nRBC: 0 % (ref 0.0–0.2)

## 2021-10-25 LAB — IRON AND TIBC
Iron: 80 ug/dL (ref 28–170)
Saturation Ratios: 16 % (ref 10.4–31.8)
TIBC: 496 ug/dL — ABNORMAL HIGH (ref 250–450)
UIBC: 416 ug/dL

## 2021-10-25 MED ORDER — APIXABAN 2.5 MG PO TABS: 2.5000 mg | ORAL_TABLET | Freq: Two times a day (BID) | ORAL | 6 refills | Status: DC

## 2021-10-25 NOTE — Progress Notes (Signed)
ADVANCED HF CLINIC NOTE  PCP: Patient, [name] Primary Cardiologist: Little Ishikawa, MD  HPI:  [name] is a 25 y.o. 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 Had COVID-19 10/21 (vaccianted not boosted).   She presented to the ED in [place] on 11/11/2020 with CP and palpitations. Chest CT no evidence of PE.  Hs trops (72 > 73 > 68).  Echo1/28/2022  EF 15-20% normal RV. Holter with rare ectopy and 6 beats run of NSVT. Myoview with anterior defect, Cath 01/10/2021 showed normal coronary arteries, normal LVEDP.     cMRI 01/26/21 LVEF 31% markedly dilated No significant LGE. RV normal. No non-compaction or other pathology  I saw her in 3/22 and placed a Zio patch to quantify PVC burden. Zio showed multiple short runs VT/TdP. Admitted for ICD. CRT-D placed 02/18/21 with RA lead to overdrive pace given TdP occurred during nighttime bradycardia .  4/22 found to have LUE DVT  Here for f/u with her mom. Has been following with Dr. Jomarie Longs. 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 This is a variant associated with Dilated Cardiomyopathy (DCM) with patients typically having an early age of diagnosis, end stage heart failure and at risk of adverse events. The gene is inherited in an autosomal dominant manner and the rate of de novo mutations in RBM20 is very low.   Just graduated CR and now walking on her own. About to start clinicals. No CP or SOB. No edema or PND. SBP 80s at times after CR.  BP ~100/65.  Occasional yeast infections.   Echo 8/22 25-30% RV normal Personally reviewed  Here for f/u with her mom. 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 Doing really well. Feels good. Was able to walk up 8 flights of steps at the hospital (2 short breaks). No CP, edema, orthopnea or PND. Occasionally will have some  palpitations and HR will be 45 on pulse ox. Then resolves (suspect PVCs/trigeminy)   Here youngest sister had echo which was normal. Other sister has not been echoed yet. Family has not had genetic testing yet.    ROS: All systems negative except as listed in HPI, PMH and Problem List.  SH:  Social History   Socioeconomic History   Marital status: Single    Spouse name: Not on file   Number of children: Not on file   Years of education: Not on file   Highest education level: Not on file  Occupational History   Not on file  Tobacco Use   Smoking status: Never   Smokeless tobacco: Never  Substance and Sexual Activity   Alcohol use: No   Drug use: No   Sexual activity: Never  Other Topics Concern   Not on file  Social History Narrative   [employer] highschool   10th grade.   volleyball   Social Determinants of Corporate investment banker Strain: Not on file  Food Insecurity: Not on file  Transportation Needs: Not on file  Physical Activity: Not on file  Stress: Not on file  Social Connections: Not on file  Intimate Partner Violence: Not on file    FH:  Family History  Problem Relation Age of Onset   Healthy Mother    Hyperlipidemia Father    Healthy Sister    Healthy Sister    Heart attack  Maternal Grandmother    Hyperlipidemia Maternal Grandfather     Past Medical History:  Diagnosis Date   Lactose intolerance    NICM (nonischemic cardiomyopathy) (HCC)    PVC's (premature ventricular contractions)    Torsades de pointes     Current Outpatient Medications  Medication Sig Dispense Refill   apixaban (ELIQUIS) 5 MG TABS tablet Take 1 tablet (5 mg total) by mouth 2 (two) times daily. 180 tablet 3   carvedilol (COREG) 6.25 MG tablet Take 1 tablet (6.25 mg total) by mouth in the morning and at bedtime. 180 tablet 3   empagliflozin (JARDIANCE) 10 MG TABS tablet Take 10 mg by mouth daily.     fluconazole (DIFLUCAN) 150 MG tablet Take 1 tablet (150 mg total) by  mouth as needed. 30 tablet 0   potassium chloride SA (KLOR-CON) 20 MEQ tablet Take 1 tablet (20 mEq total) by mouth daily. 90 tablet 3   sacubitril-valsartan (ENTRESTO) 24-26 MG Take 1 tablet by mouth 2 (two) times daily. 180 tablet 3   sertraline (ZOLOFT) 25 MG tablet Take 25 mg by mouth daily.     spironolactone (ALDACTONE) 25 MG tablet Take 0.5 tablets (12.5 mg total) by mouth at bedtime. 45 tablet 3   TRI-ESTARYLLA 0.18/0.215/0.25 MG-35 MCG tablet Take 1 tablet by mouth daily.     valACYclovir (VALTREX) 1000 MG tablet Take 1,000 mg by mouth as needed for rash.     No current facility-administered medications for this encounter.    Vitals:   10/25/21 1058  BP: 100/60  Pulse: 78  SpO2: 97%  Weight: 87.2 kg (192 lb 3.2 oz)    PHYSICAL EXAM: General:  Well appearing. No resp difficulty HEENT: normal Neck: supple. no JVD. Carotids 2+ bilat; no bruits. No lymphadenopathy or thryomegaly appreciated. Cor: PMI nondisplaced. Regular rate & rhythm. No rubs, gallops or murmurs. Lungs: clear Abdomen: soft, nontender, nondistended. No hepatosplenomegaly. No bruits or masses. Good bowel sounds. Extremities: no cyanosis, clubbing, rash, edema Neuro: alert & orientedx3, cranial nerves grossly intact. moves all 4 extremities w/o difficulty. Affect pleasant  ICD interrogation: HL Logic 1  One episode on NSVT (15 seconds) 09/05/21   ASSESSMENT & PLAN:  1. Chronic HFrEF - due to NICM. Suspect viral (? COVID) myocarditis - Echo 1/22 EF 15- 20% - [place] showed normal coronary arteries, normal LVEDP.  - cMRI 01/26/21 LVEF 31% markedly dilated No significant LGE. RV normal. No non-compaction or other pathology - Echo 4/22 EF 25% (I felt 30-35%) - Echo 8/22 EF 25-30% (stable) - Genetic w/u shows RBM20 gene, namely c.1907G>A, P.Arg636His. This is a variant associated with Dilated Cardiomyopathy (DCM) with patients typically having an early age of diagnosis, end stage heart failure and  at risk of adverse events. The gene is inherited in an autosomal dominant manner and the rate of de novo mutations in RBM20 is very low.  - Doing well NYHA II - Volume looks good.  - Continue Jardiance 10 (has only had 1 further yeast infection) - Continue Entresto 24/26 bid - Continue spiro 12.5 - Continue carvedilol 6.25 bid - BP too low to titrate - RTC in 4 months with echo     2.Torsades/VT - Zio 3/22 with multiple episodes VT/TdP. PVCs 1% - CRT-D placed 02/18/21 with RA lead to overdrive pace given TdP occurred during nighttime bradycardia. Followed by [name] - ICD interrogated today. 1 episode of NSVT - Has f/u with [name] soon   3. LUE DVT -  u/s 4/22 - On Eliquis. No bleeding.  - Can cut to 2.5 bid per [employer]-EXT data  Arvilla Meres, MD  11:13 AM

## 2021-10-25 NOTE — Patient Instructions (Signed)
Medication Changes:  Decrease your Eliquis to 2.5mg  Twice daily   Lab Work:  Labs done today, your results will be available in MyChart, we will contact you for abnormal readings.   Testing/Procedures:  Your physician has requested that you have an echocardiogram. Echocardiography is a painless test that uses sound waves to create images of your heart. It provides your doctor with information about the size and shape of your heart and how well your hearts chambers and valves are working. This procedure takes approximately one hour. There are no restrictions for this procedure.   Referrals:  none  Special Instructions // Education:  non3  Follow-Up in: 4 Months (April 2023) with Echocardiogram  **Call in March for appointment**  At the [facility], you and your health needs are our priority. We have a designated team specialized in the treatment of Heart Failure. This Care Team includes your primary Heart Failure Specialized Cardiologist (physician), Advanced Practice Providers (APPs- Physician Assistants and Nurse Practitioners), and Pharmacist who all work together to provide you with the care you need, when you need it.   You may see any of the following providers on your designated Care Team at your next follow up:  Dr Arvilla Meres Dr Carron Curie, NP Robbie Lis, [place] [facility] [place], [place] Karle Plumber, PharmD   Please be sure to bring in all your medications bottles to every appointment.   Need to Contact us:  If you have any questions or concerns before your next appointment please send [place] a message through [place] or call our office at [phone].    TO LEAVE A MESSAGE FOR THE NURSE SELECT OPTION 2, PLEASE LEAVE A MESSAGE INCLUDING: YOUR NAME DATE OF BIRTH CALL BACK NUMBER REASON FOR CALL**this is important as we prioritize the call backs  YOU WILL RECEIVE A CALL BACK THE SAME DAY AS LONG AS YOU CALL BEFORE  4:00 PM

## 2021-10-25 NOTE — Addendum Note (Signed)
Encounter addended by: Linda Hedges, RN on: 10/25/2021 12:04 PM  Actions taken: Order list changed, Diagnosis association updated, Pend clinical note, Charge Capture section accepted, Clinical Note Signed

## 2021-11-02 ENCOUNTER — Ambulatory Visit (INDEPENDENT_AMBULATORY_CARE_PROVIDER_SITE_OTHER): Payer: BC Managed Care – PPO | Admitting: Cardiology

## 2021-11-02 ENCOUNTER — Other Ambulatory Visit: Payer: Self-pay

## 2021-11-02 ENCOUNTER — Encounter: Payer: Self-pay | Admitting: Cardiology

## 2021-11-02 VITALS — BP 120/62 | HR 82 | Ht 66.0 in | Wt 192.8 lb

## 2021-11-02 DIAGNOSIS — I493 Ventricular premature depolarization: Secondary | ICD-10-CM | POA: Diagnosis not present

## 2021-11-02 DIAGNOSIS — I82622 Acute embolism and thrombosis of deep veins of left upper extremity: Secondary | ICD-10-CM

## 2021-11-02 DIAGNOSIS — I5042 Chronic combined systolic (congestive) and diastolic (congestive) heart failure: Secondary | ICD-10-CM | POA: Diagnosis not present

## 2021-11-02 DIAGNOSIS — I4721 Torsades de pointes: Secondary | ICD-10-CM

## 2021-11-02 DIAGNOSIS — I428 Other cardiomyopathies: Secondary | ICD-10-CM | POA: Diagnosis not present

## 2021-11-02 DIAGNOSIS — Z95810 Presence of automatic (implantable) cardiac defibrillator: Secondary | ICD-10-CM

## 2021-11-02 NOTE — Progress Notes (Signed)
Electrophysiology Office Follow up Visit Note:    Date:  11/02/2021   ID:  [name], DOB [birth year], MRN [id number]  PCP:  Patient, [name]  [facility] Cardiologist:  Donato Heinz, MD  [facility] Electrophysiologist:  Vickie Epley, MD    Interval History:    [name] is a 25 y.o. female who presents for a follow up visit. They were last seen in clinic June 03, 2021.  She had an ICD implanted February 18, 2021.  She has had no therapies delivered from her device.  She is with her mother today in clinic wife previously met.  She is back at school in [place] and is doing well.  She is very pleased with her progress.     Past Medical History:  Diagnosis Date   Lactose intolerance    NICM (nonischemic cardiomyopathy) ([place])    PVC's (premature ventricular contractions)    Torsades de pointes     Past Surgical History:  Procedure Laterality Date   ICD IMPLANT N/A 02/18/2021   Procedure: ICD IMPLANT;  Surgeon: Vickie Epley, MD;  Location: [facility];  Service: Cardiovascular;  Laterality: N/A;   ICD IMPLANT Left 02/18/2021    Current Medications: Current Meds  Medication Sig   apixaban (ELIQUIS) 2.5 MG TABS tablet Take 1 tablet (2.5 mg total) by mouth 2 (two) times daily.   carvedilol (COREG) 6.25 MG tablet Take 1 tablet (6.25 mg total) by mouth in the morning and at bedtime.   empagliflozin (JARDIANCE) 10 MG TABS tablet Take 10 mg by mouth daily.   fluconazole (DIFLUCAN) 150 MG tablet Take 1 tablet (150 mg total) by mouth as needed.   potassium chloride SA (KLOR-CON) 20 MEQ tablet Take 1 tablet (20 mEq total) by mouth daily.   sacubitril-valsartan (ENTRESTO) 24-26 MG Take 1 tablet by mouth 2 (two) times daily.   sertraline (ZOLOFT) 25 MG tablet Take 25 mg by mouth daily.   spironolactone (ALDACTONE) 25 MG tablet Take 0.5 tablets (12.5 mg total) by mouth at bedtime.   TRI-ESTARYLLA 0.18/0.215/0.25 MG-35 MCG  tablet Take 1 tablet by mouth daily.   valACYclovir (VALTREX) 1000 MG tablet Take 1,000 mg by mouth as needed for rash.     Allergies:   Patient has no known allergies.   Social History   Socioeconomic History   Marital status: Single    Spouse name: Not on file   Number of children: Not on file   Years of education: Not on file   Highest education level: Not on file  Occupational History   Not on file  Tobacco Use   Smoking status: Never   Smokeless tobacco: Never  Substance and Sexual Activity   Alcohol use: No   Drug use: No   Sexual activity: Never  Other Topics Concern   Not on file  Social History Narrative   [employer]   10th grade.   volleyball   Social Determinants of Radio broadcast assistant Strain: Not on file  Food Insecurity: Not on file  Transportation Needs: Not on file  Physical Activity: Not on file  Stress: Not on file  Social Connections: Not on file     Family History: The patient's family history includes Healthy in her mother, sister, and sister; Heart attack in her maternal grandmother; Hyperlipidemia in her father and maternal grandfather.  ROS:   Please see the history of present illness.    All other  systems reviewed and are negative.  EKGs/Labs/Other Studies Reviewed:    The following studies were reviewed today:  Remote device interrogations  November 02, 2021 in clinic device interrogation personally reviewed Longevity 13 years Lead parameters are stable 43% atrially paced Programmed AAI with VVI backup 70-1 21 nonsustained ventricular tachycardia episode.  Review of EGM confirms an SVT with intact VA conduction.  Episode lasted 11 beats.   EKG:  The ekg ordered today demonstrates sinus rhythm.  Recent Labs: 05/02/2021: ALT 16 06/29/2021: B Natriuretic Peptide 83.7 10/25/2021: BUN 11; Creatinine, Ser 0.72; Hemoglobin 13.4; Magnesium 2.2; Platelets 299; Potassium 4.2; Sodium 136  Recent Lipid Panel No results  found for: CHOL, TRIG, HDL, CHOLHDL, VLDL, LDLCALC, LDLDIRECT  Physical Exam:    VS:  BP 120/62    Pulse 82    Ht 5' 6" (1.676 m)    Wt 192 lb 12.8 oz (87.5 kg)    SpO2 99%    BMI 31.12 kg/m     Wt Readings from Last 3 Encounters:  11/02/21 192 lb 12.8 oz (87.5 kg)  10/25/21 192 lb 3.2 oz (87.2 kg)  06/29/21 184 lb 6.4 oz (83.6 kg)     GEN:  Well nourished, well developed in no acute distress HEENT: Normal NECK: No JVD; No carotid bruits LYMPHATICS: No lymphadenopathy CARDIAC: RRR, no murmurs, rubs, gallops.  ICD pocket well-healed. RESPIRATORY:  Clear to auscultation without rales, wheezing or rhonchi  ABDOMEN: Soft, non-tender, non-distended MUSCULOSKELETAL:  No edema; No deformity  SKIN: Warm and dry NEUROLOGIC:  Alert and oriented x 3 PSYCHIATRIC:  Normal affect        ASSESSMENT:    1. NICM (nonischemic cardiomyopathy) ([place])   2. Chronic combined systolic and diastolic congestive heart failure ([place])   3. PVC's (premature ventricular contractions)   4. Torsades de pointes    PLAN:    In order of problems listed above:  #Nonischemic cardiomyopathy #Chronic combined systolic and diastolic heart failure NYHA class II.  Warm and dry on exam.  Follows with heart failure clinic.  Continue Jardiance, Coreg, Entresto, Aldactone.  #ICD in situ Device functioning appropriately.  #History of VT/torsades de pointes Maintain base rate of 70 bpm Beta-blocker as above  #History of upper extremity DVT Continue half dose Eliquis.  Patient plans to follow-up in April of this year when she comes back to follow-up with Dr. Haroldine Laws in the heart failure clinic.        Total time spent with patient today 30 minutes. This includes reviewing records, evaluating the patient and coordinating care.   Medication Adjustments/Labs and Tests Ordered: Current medicines are reviewed at length with the patient today.  Concerns regarding medicines are outlined above.  Orders  Placed This Encounter  Procedures   EKG 12-Lead   No orders of the defined types were placed in this encounter.    Signed, Lars Mage, MD, [facility], [facility] 11/02/2021 10:26 PM    Electrophysiology [facility]

## 2021-11-02 NOTE — Patient Instructions (Addendum)
Medication Instructions:  Your physician recommends that you continue on your current medications as directed. Please refer to the Current Medication list given to you today. *If you need a refill on your cardiac medications before your next appointment, please call your pharmacy*  Lab Work: None ordered. If you have labs (blood work) drawn today and your tests are completely normal, you will receive your results only by: MyChart Message (if you have MyChart) OR A paper copy in the mail If you have any lab test that is abnormal or we need to change your treatment, we will call you to review the results.  Testing/Procedures: None ordered.  Follow-Up: At [facility], you and your health needs are our priority.  As part of our continuing mission to provide you with exceptional heart care, we have created designated Provider Care Teams.  These Care Teams include your primary Cardiologist (physician) and Advanced Practice Providers (APPs -  Physician Assistants and Nurse Practitioners) who all work together to provide you with the care you need, when you need it.  Your next appointment:   Your physician wants you to follow-up in: April 2023 at [facility] Prude, [place].  Remote monitoring is used to monitor your ICD from home. This monitoring reduces the number of office visits required to check your device to one time per year. It allows [place] to keep an eye on the functioning of your device to ensure it is working properly. You are scheduled for a device check from home on 11/21/2021. You may send your transmission at any time that day. If you have a wireless device, the transmission will be sent automatically. After your physician reviews your transmission, you will receive a postcard with your next transmission date.

## 2021-11-21 ENCOUNTER — Ambulatory Visit (INDEPENDENT_AMBULATORY_CARE_PROVIDER_SITE_OTHER): Payer: BC Managed Care – PPO

## 2021-11-21 DIAGNOSIS — I428 Other cardiomyopathies: Secondary | ICD-10-CM | POA: Diagnosis not present

## 2021-11-22 ENCOUNTER — Encounter: Payer: Self-pay | Admitting: Cardiology

## 2021-11-22 LAB — CUP PACEART REMOTE DEVICE CHECK
Battery Remaining Longevity: 150 mo
Battery Remaining Percentage: 100 %
Brady Statistic RA Percent Paced: 37 %
Brady Statistic RV Percent Paced: 0 %
Date Time Interrogation Session: 20230117012100
HighPow Impedance: 73 Ohm
Implantable Lead Implant Date: 20220415
Implantable Lead Implant Date: 20220415
Implantable Lead Location: 753859
Implantable Lead Location: 753860
Implantable Lead Model: 672
Implantable Lead Model: 7841
Implantable Lead Serial Number: 1134026
Implantable Lead Serial Number: 165867
Implantable Pulse Generator Implant Date: 20220415
Lead Channel Impedance Value: 638 Ohm
Lead Channel Impedance Value: 668 Ohm
Lead Channel Setting Pacing Amplitude: 2 V
Lead Channel Setting Pacing Amplitude: 2.5 V
Lead Channel Setting Pacing Pulse Width: 0.4 ms
Lead Channel Setting Sensing Sensitivity: 0.5 mV
Pulse Gen Serial Number: 597305

## 2021-12-01 NOTE — Progress Notes (Signed)
Remote ICD transmission.   

## 2021-12-03 ENCOUNTER — Other Ambulatory Visit (HOSPITAL_COMMUNITY): Payer: Self-pay | Admitting: Internal Medicine

## 2021-12-19 ENCOUNTER — Encounter (HOSPITAL_COMMUNITY): Payer: Self-pay | Admitting: Internal Medicine

## 2021-12-20 ENCOUNTER — Other Ambulatory Visit (HOSPITAL_COMMUNITY): Payer: Self-pay

## 2021-12-20 MED ORDER — EMPAGLIFLOZIN 10 MG PO TABS: 10.0000 mg | ORAL_TABLET | Freq: Every day | ORAL | 11 refills | Status: AC

## 2022-02-10 ENCOUNTER — Ambulatory Visit (HOSPITAL_COMMUNITY)
Admission: RE | Admit: 2022-02-10 | Discharge: 2022-02-10 | Disposition: A | Discharge status: Home / Self Care | Payer: BC Managed Care – PPO | Source: Ambulatory Visit | Attending: Internal Medicine | Admitting: Internal Medicine

## 2022-02-10 ENCOUNTER — Encounter: Payer: Self-pay | Admitting: Cardiology

## 2022-02-10 ENCOUNTER — Ambulatory Visit (HOSPITAL_BASED_OUTPATIENT_CLINIC_OR_DEPARTMENT_OTHER)
Admission: RE | Admit: 2022-02-10 | Discharge: 2022-02-10 | Disposition: A | Discharge status: Home / Self Care | Payer: BC Managed Care – PPO | Source: Ambulatory Visit | Attending: Internal Medicine | Admitting: Internal Medicine

## 2022-02-10 ENCOUNTER — Ambulatory Visit (INDEPENDENT_AMBULATORY_CARE_PROVIDER_SITE_OTHER): Payer: BC Managed Care – PPO | Admitting: Cardiology

## 2022-02-10 VITALS — BP 116/76 | HR 92 | Wt 213.4 lb

## 2022-02-10 VITALS — BP 122/80 | HR 89 | Ht 66.0 in | Wt 213.2 lb

## 2022-02-10 DIAGNOSIS — I472 Ventricular tachycardia, unspecified: Secondary | ICD-10-CM

## 2022-02-10 DIAGNOSIS — Z8616 Personal history of COVID-19: Secondary | ICD-10-CM | POA: Diagnosis not present

## 2022-02-10 DIAGNOSIS — Z95810 Presence of automatic (implantable) cardiac defibrillator: Secondary | ICD-10-CM

## 2022-02-10 DIAGNOSIS — I428 Other cardiomyopathies: Secondary | ICD-10-CM

## 2022-02-10 DIAGNOSIS — I5084 End stage heart failure: Secondary | ICD-10-CM | POA: Diagnosis not present

## 2022-02-10 DIAGNOSIS — Z7901 Long term (current) use of anticoagulants: Secondary | ICD-10-CM | POA: Insufficient documentation

## 2022-02-10 DIAGNOSIS — I5042 Chronic combined systolic (congestive) and diastolic (congestive) heart failure: Secondary | ICD-10-CM | POA: Diagnosis not present

## 2022-02-10 DIAGNOSIS — I4721 Torsades de pointes: Secondary | ICD-10-CM | POA: Insufficient documentation

## 2022-02-10 DIAGNOSIS — I420 Dilated cardiomyopathy: Secondary | ICD-10-CM | POA: Diagnosis not present

## 2022-02-10 DIAGNOSIS — I493 Ventricular premature depolarization: Secondary | ICD-10-CM

## 2022-02-10 DIAGNOSIS — E669 Obesity, unspecified: Secondary | ICD-10-CM | POA: Insufficient documentation

## 2022-02-10 DIAGNOSIS — I5022 Chronic systolic (congestive) heart failure: Secondary | ICD-10-CM

## 2022-02-10 LAB — ECHOCARDIOGRAM COMPLETE
AR max vel: 1.87 cm2
AV Area VTI: 2.05 cm2
AV Area mean vel: 1.7 cm2
AV Mean grad: 3 mmHg
AV Peak grad: 5.6 mmHg
Ao pk vel: 1.18 m/s
Area-P 1/2: 4.86 cm2
S' Lateral: 6.6 cm
Weight: 3414.4 oz

## 2022-02-10 LAB — PACEMAKER DEVICE OBSERVATION

## 2022-02-10 MED ORDER — METOPROLOL SUCCINATE ER 100 MG PO TB24: 150.0000 mg | ORAL_TABLET | Freq: Every day | ORAL | 3 refills | Status: DC

## 2022-02-10 NOTE — Progress Notes (Signed)
? ? ?ADVANCED HF CLINIC NOTE ? ?PCP: Patient, [name] ?Primary Cardiologist: Donato Heinz, MD ? ?HPI: ? ?[name] is a 26 y.o. 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 ? 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 Had COVID-19 10/21 (vaccianted not boosted). ?  ?She presented to the ED in [place] on 11/11/2020 with CP and palpitations. Chest CT no evidence of PE.  Hs trops (72 > 73 > 68).  Echo1/28/2022  EF 15-20% normal RV. Holter with rare ectopy and 6 beats run of NSVT. Myoview with anterior defect, Cath 01/10/2021 showed normal coronary arteries, normal LVEDP.   ?  ?cMRI 01/26/21 LVEF 31% markedly dilated No significant LGE. RV normal. No non-compaction or other pathology ? ?I saw her in 3/22 and placed a Zio patch to quantify PVC burden. Zio showed multiple short runs VT/TdP. Admitted for ICD. CRT-D placed 02/18/21 with RA lead to overdrive pace given TdP occurred during nighttime bradycardia . ? ?4/22 found to have LUE DVT ? ?Here for f/u with her mom. Has been following with Dr. Broadus John. 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 This is a variant associated with Dilated Cardiomyopathy (DCM) with patients typically having an early age of diagnosis, end stage heart failure and at risk of adverse events. The gene is inherited in an autosomal dominant manner and the rate of de novo mutations in RBM20 is very low.  ? ?Just graduated [employer] and now walking on her own. About to start clinicals. No CP or SOB. No edema or PND. SBP 80s at times after CR.  BP ~100/65.  Occasional yeast infections.  ? ?Echo 8/22 25-30% RV normal Personally reviewed ? ?Here for f/u with her mom. Says she is feeling great. Working in schools with nutrition. Denies SOB, edema, orthopnea or PND .Struggling with weight loss. Exercising more regularly. Walking outside. [place] and exercising  on TM and elliptical. Has gained 20 pounds. No dizziness.  ? ? ?Her youngest sister had echo which was normal. Other sister has not been echoed yet. Family has not had genetic testing yet.  ? ? ?ROS: All systems negative except as listed in HPI, PMH and Problem List. ? ?SH:  ?Social History  ? ?Socioeconomic History  ? Marital status: Single  ?  Spouse name: Not on file  ? Number of children: Not on file  ? Years of education: Not on file  ? Highest education level: Not on file  ?Occupational History  ? Not on file  ?Tobacco Use  ? Smoking status: Never  ? Smokeless tobacco: Never  ?Substance and Sexual Activity  ? Alcohol use: No  ? Drug use: No  ? Sexual activity: Never  ?Other Topics Concern  ? Not on file  ?Social History Narrative  ? [employer]  ? 10th grade.  ? volleyball  ? ?Social Determinants of Health  ? ?Financial Resource Strain: Not on file  ?Food Insecurity: Not on file  ?Transportation Needs: Not on file  ?Physical Activity: Not on file  ?Stress: Not on file  ?Social Connections: Not on file  ?Intimate Partner Violence: Not on file  ? ? ?FH:  ?Family History  ?Problem Relation Age of Onset  ? Healthy Mother   ? Hyperlipidemia Father   ? Healthy Sister   ? Healthy Sister   ? Heart attack Maternal Grandmother   ?  Hyperlipidemia Maternal Grandfather   ? ? ?Past Medical History:  ?Diagnosis Date  ? Lactose intolerance   ? NICM (nonischemic cardiomyopathy) ([place])   ? PVC's (premature ventricular contractions)   ? Torsades de pointes   ? ? ?Current Outpatient Medications  ?Medication Sig Dispense Refill  ? apixaban (ELIQUIS) 2.5 MG TABS tablet Take 1 tablet (2.5 mg total) by mouth 2 (two) times daily. 90 tablet 6  ? empagliflozin (JARDIANCE) 10 MG TABS tablet Take 1 tablet (10 mg total) by mouth daily. 30 tablet 11  ? fluconazole (DIFLUCAN) 150 MG tablet Take 1 tablet (150 mg total) by mouth as needed. 30 tablet 0  ? metoprolol tartrate (LOPRESSOR) 100 MG tablet Take 100 mg by mouth daily.    ?  potassium chloride SA (KLOR-CON) 20 MEQ tablet Take 1 tablet (20 mEq total) by mouth daily. 90 tablet 3  ? sacubitril-valsartan (ENTRESTO) 24-26 MG Take 1 tablet by mouth 2 (two) times daily. 180 tablet 3  ? sertraline (ZOLOFT) 25 MG tablet Take 25 mg by mouth daily.    ? spironolactone (ALDACTONE) 25 MG tablet Take 0.5 tablets (12.5 mg total) by mouth at bedtime. 45 tablet 3  ? TRI-ESTARYLLA 0.18/0.215/0.25 MG-35 MCG tablet Take 1 tablet by mouth daily.    ? valACYclovir (VALTREX) 1000 MG tablet Take 1,000 mg by mouth as needed for rash.    ? ?No current facility-administered medications for this encounter.  ? ? ?Vitals:  ? 02/10/22 1058  ?BP: 116/76  ?Pulse: 92  ?SpO2: 98%  ?Weight: 96.8 kg (213 lb 6.4 oz)  ? ?Wt Readings from Last 3 Encounters:  ?02/10/22 96.8 kg (213 lb 6.4 oz)  ?11/02/21 87.5 kg (192 lb 12.8 oz)  ?10/25/21 87.2 kg (192 lb 3.2 oz)  ? ? ? ?PHYSICAL EXAM: ?General:  Well appearing. No resp difficulty ?HEENT: normal ?Neck: supple. no JVD. Carotids 2+ bilat; no bruits. No lymphadenopathy or thryomegaly appreciated. ?Cor: PMI nondisplaced. Regular rate & rhythm. No rubs, gallops or murmurs. ?Lungs: clear ?Abdomen: obese soft, nontender, nondistended. No hepatosplenomegaly. No bruits or masses. Good bowel sounds. ?Extremities: no cyanosis, clubbing, rash, edema ?Neuro: alert & orientedx3, cranial nerves grossly intact. moves all 4 extremities w/o difficulty. Affect pleasant ? ?ICD interrogation: HL Logic 8 No VT/AF Personally reviewed ? ?ASSESSMENT & PLAN: ? ?1. Chronic HFrEF ?- due to NICM. Suspect viral (? COVID) myocarditis ?- Echo 1/22 EF 15- 20% ?- [place] 01/10/2021 in [place] showed normal coronary arteries, normal LVEDP.  ?- cMRI 01/26/21 LVEF 31% markedly dilated No significant LGE. RV normal. No non-compaction or other pathology ?- Echo 4/22 EF 25% (I felt 30-35%) ?- Echo 8/22 EF 25-30% (stable) ?- s/p CRT-D 4/22. ICD interrogation as above  ?- Genetic w/u shows RBM20 gene, namely c.1907G>A,  P.Arg636His. This is a variant associated with Dilated Cardiomyopathy (DCM) with patients typically having an early age of diagnosis, end stage heart failure and at risk of adverse events. The gene is inherited in an autosomal dominant manner and the rate of de novo mutations in RBM20 is very low.  ?- Doing well NYHA II ?- Volume looks good.  ?- Continue Jardiance 10 (has only had 1 further yeast infection) ?- Continue Entresto 24/26 bid ?- Continue spiro 12.5 ?- Increase toprol to 150 daily ?- Needs echo will see if we can arrange for today ?- [facility]  ? ?2.Torsades/VT ?- Zio 3/22 with multiple episodes VT/TdP. PVCs 1% ?- CRT-D placed 02/18/21 with RA lead to  overdrive pace given TdP occurred during nighttime bradycardia. Followed by Dr. Quentin Ore ?- ICD interrogated today. No VT ?- Follows with Dr. Quentin Ore ? ?3. LUE DVT ?- u/s 4/22 ?- On Eliquis 2.5 bid per Amplify-EXT data ?- No bleeding ? ?4. Obesity ?- discussed possible GLP1RA use ? ?Glori Bickers, MD  ?11:35 AM ? ? ?

## 2022-02-10 NOTE — Patient Instructions (Signed)
Increase Metoprolol Succinate to 150 mg (1 & 1/2 tabs) Daily ? ?Your physician has requested that you have an echocardiogram. Echocardiography is a painless test that uses sound waves to create images of your heart. It provides your doctor with information about the size and shape of your heart and how well your heart?s chambers and valves are working. This procedure takes approximately one hour. There are no restrictions for this procedure. ? ?Your physician recommends that you schedule a follow-up appointment in: 6 months, **PLEASE CALL OUR OFFICE IN June TO SCHEDULE THIS ? ?If you have any questions or concerns before your next appointment please send [place] a message through [place] or call our office at [phone].   ? ?TO LEAVE A MESSAGE FOR THE NURSE SELECT OPTION 2, PLEASE LEAVE A MESSAGE INCLUDING: ?YOUR NAME ?DATE OF BIRTH ?CALL BACK NUMBER ?REASON FOR CALL**this is important as we prioritize the call backs ? ?YOU WILL RECEIVE A CALL BACK THE SAME DAY AS LONG AS YOU CALL BEFORE 4:00 PM ? ?At the [facility], you and your health needs are our priority. As part of our continuing mission to provide you with exceptional heart care, we have created designated Provider Care Teams. These Care Teams include your primary Cardiologist (physician) and Advanced Practice Providers (APPs- Physician Assistants and Nurse Practitioners) who all work together to provide you with the care you need, when you need it.  ? ?You may see any of the following providers on your designated Care Team at your next follow up: ?Dr Arvilla Meres ?Dr Marca Ancona ?Tonye Becket, NP ?Robbie Lis, PA ?Jessica Milford,NP ?Anna Genre, PA ?Karle Plumber, PharmD ? ? ?Please be sure to bring in all your medications bottles to every appointment.  ? ?

## 2022-02-10 NOTE — Addendum Note (Signed)
Encounter addended by: Noralee Space, RN on: 02/10/2022 11:47 AM ? Actions taken: Pharmacy for encounter modified, Medication long-term status modified, Order list changed, Clinical Note Signed

## 2022-02-10 NOTE — Progress Notes (Signed)
?Electrophysiology Office Follow up Visit Note:   ? ?Date:  02/10/2022  ? ?ID:  [name], DOB 13-Jul-1996, MRN [id number] ? ?PCP:  Patient, [name]  ?[facility] Cardiologist:  Donato Heinz, MD  ?[facility] Electrophysiologist:  Vickie Epley, MD  ? ? ?Interval History:   ? ?[name] is a 26 y.o. female who presents for a follow up visit. They were last seen in clinic 11/02/2021. ?Since their last appointment, she followed up with Dr. Haroldine Laws earlier today. Toprol was increased to 150 mg daily and an echocardiogram was ordered. ICD was interrogated, no VT noted. ? ?She is accompanied by her mother. Overall, she appears well. She has been feeling good, with only one week of feeling "rough." She was very fatigued, and sleeping frequently. Otherwise she has been feeling better on her current regimen. ? ?If she turns a certain way, she feels a pain that she describes as "a rubber band snap." Lately this has not been as prevalent as it was previously. However, she is feeling some pain and soreness with palpation during examination today. She also notes soreness around her device pocket after waking up in the mornings, which she believes may be related to her sleeping position. ? ?Lately she has been trying to increase her exercise, currently at least 3 times a week. ? ?She endorses seasonal allergies, which have been worse since arriving here from [place]. ? ?She denies any palpitations, shortness of breath, or peripheral edema. No lightheadedness, headaches, syncope, orthopnea, or PND. ? ? ?  ? ?Past Medical History:  ?Diagnosis Date  ? Lactose intolerance   ? NICM (nonischemic cardiomyopathy) ([facility])   ? PVC's (premature ventricular contractions)   ? Torsades de pointes ([facility])   ? ? ?Past Surgical History:  ?Procedure Laterality Date  ? ICD IMPLANT N/A 02/18/2021  ? Procedure: ICD IMPLANT;  Surgeon: Vickie Epley, MD;  Location: [facility];  Service:  Cardiovascular;  Laterality: N/A;  ? ICD IMPLANT Left 02/18/2021  ? ? ?Current Medications: ?Current Meds  ?Medication Sig  ? apixaban (ELIQUIS) 2.5 MG TABS tablet Take 1 tablet (2.5 mg total) by mouth 2 (two) times daily.  ? empagliflozin (JARDIANCE) 10 MG TABS tablet Take 1 tablet (10 mg total) by mouth daily.  ? fluconazole (DIFLUCAN) 150 MG tablet Take 1 tablet (150 mg total) by mouth as needed.  ? metoprolol succinate (TOPROL XL) 100 MG 24 hr tablet Take 1.5 tablets (150 mg total) by mouth daily. Take with or immediately following a meal.  ? potassium chloride SA (KLOR-CON) 20 MEQ tablet Take 1 tablet (20 mEq total) by mouth daily.  ? sacubitril-valsartan (ENTRESTO) 24-26 MG Take 1 tablet by mouth 2 (two) times daily.  ? sertraline (ZOLOFT) 100 MG tablet Take 100 mg by mouth daily.  ? spironolactone (ALDACTONE) 25 MG tablet Take 0.5 tablets (12.5 mg total) by mouth at bedtime.  ? TRI-ESTARYLLA 0.18/0.215/0.25 MG-35 MCG tablet Take 1 tablet by mouth daily.  ? valACYclovir (VALTREX) 1000 MG tablet Take 1,000 mg by mouth as needed for rash.  ?  ? ?Allergies:   Patient has no known allergies.  ? ?Social History  ? ?Socioeconomic History  ? Marital status: Single  ?  Spouse name: Not on file  ? Number of children: Not on file  ? Years of education: Not on file  ? Highest education level: Not on file  ?Occupational History  ? Not on file  ?Tobacco Use  ?  Smoking status: Never  ? Smokeless tobacco: Never  ?Substance and Sexual Activity  ? Alcohol use: No  ? Drug use: No  ? Sexual activity: Never  ?Other Topics Concern  ? Not on file  ?Social History Narrative  ? [employer]  ? 10th grade.  ? volleyball  ? ?Social Determinants of Health  ? ?Financial Resource Strain: Not on file  ?Food Insecurity: Not on file  ?Transportation Needs: Not on file  ?Physical Activity: Not on file  ?Stress: Not on file  ?Social Connections: Not on file  ?  ? ?Family History: ?The patient's family history includes Healthy in her  mother, sister, and sister; Heart attack in her maternal grandmother; Hyperlipidemia in her father and maternal grandfather. ? ?ROS:   ?Please see the history of present illness.    ?(+) Seasonal allergies ?All other systems reviewed and are negative. ? ?EKGs/Labs/Other Studies Reviewed:   ? ?The following studies were reviewed today: ? ?Echo 02/10/2022: ? 1. Left ventricular ejection fraction, by estimation, is 25 to 30%. The  ?left ventricle has severely decreased function. The left ventricle  ?demonstrates global hypokinesis. The left ventricular internal cavity size  ?was severely dilated. Left ventricular  ?diastolic parameters are consistent with Grade II diastolic dysfunction  ?(pseudonormalization).  ? 2. Right ventricular systolic function is normal. The right ventricular  ?size is normal. There is normal pulmonary artery systolic pressure.  ? 3. Left atrial size was mildly dilated.  ? 4. Mild mitral valve regurgitation.  ? 5. The aortic valve is grossly normal. Aortic valve regurgitation is not  ?visualized.  ? ?Comparison(s): No significant change from prior study.  ? ?Remote device interrogations ?  ?November 02, 2021 in clinic device interrogation personally reviewed ?Longevity 13 years ?Lead parameters are stable ?43% atrially paced ?Programmed AAI with VVI backup 70-1 21 nonsustained ventricular tachycardia episode.  Review of EGM confirms an SVT with intact VA conduction.  Episode lasted 11 beats. ? ?Echo 06/29/2021: ? 1. Left ventricular ejection fraction, by estimation, is 25 to 30%. The  ?left ventricle has severely decreased function. The left ventricle  ?demonstrates global hypokinesis. The left ventricular internal cavity size  ?was severely dilated. Left ventricular  ?diastolic parameters are consistent with Grade II diastolic dysfunction  ?(pseudonormalization).  ? 2. Right ventricular systolic function is normal. The right ventricular  ?size is normal. There is normal pulmonary artery systolic  pressure.  ? 3. Left atrial size was mildly dilated.  ? 4. Right atrial size was mildly dilated.  ? 5. The mitral valve is normal in structure. Moderate mitral valve  ?regurgitation. No evidence of mitral stenosis.  ? 6. The aortic valve is normal in structure. Aortic valve regurgitation is  ?not visualized. No aortic stenosis is present.  ? 7. The inferior vena cava is normal in size with greater than 50%  ?respiratory variability, suggesting right atrial pressure of 3 mmHg.  ? ?ICD Implant 02/18/2021: ?CONCLUSIONS:  ? 1. Non-ischemic cardiomyopathy with chronic New York Heart Association class II heart failure and NSVT ? 2. Successful ICD implantation with a [employer] single coil ICD implanted for primary prevention of sudden death.  ? 3.  No early apparent complications.  ? 4. LRL 80bpm given TdP during bradycardia. If TdP despite increased LRL, consider AAD. ? ? ?EKG:  EKG is personally reviewed.  ?02/10/2022: sinus rhythm ?11/02/2021: Sinus rhythm. ? ? ?02/10/2022 device interrogation in clinic. Batter 13 years. No HV therapies. Lead parameters stable. ? ? ?Recent Labs: ?05/02/2021:  ALT 16 ?06/29/2021: B Natriuretic Peptide 83.7 ?10/25/2021: BUN 11; Creatinine, Ser 0.72; Hemoglobin 13.4; Magnesium 2.2; Platelets 299; Potassium 4.2; Sodium 136  ? ?Recent Lipid Panel ?No results found for: CHOL, TRIG, HDL, CHOLHDL, VLDL, LDLCALC, LDLDIRECT ? ?Physical Exam:   ? ?VS:  BP 122/80   Pulse 89   Ht 5\' 6"  (1.676 m)   Wt 213 lb 3.2 oz (96.7 kg)   SpO2 98%   BMI 34.41 kg/m?    ? ?Wt Readings from Last 3 Encounters:  ?02/10/22 213 lb 3.2 oz (96.7 kg)  ?02/10/22 213 lb 6.4 oz (96.8 kg)  ?11/02/21 192 lb 12.8 oz (87.5 kg)  ?  ? ?GEN: Well nourished, well developed in no acute distress ?HEENT: Normal ?NECK: No JVD; No carotid bruits ?LYMPHATICS: No lymphadenopathy ?CARDIAC: RRR, no murmurs, rubs, gallops; Device pocket healed well. Prominent surface collaterals on her L upper chest ?RESPIRATORY:  Clear to auscultation  without rales, wheezing or rhonchi  ?ABDOMEN: Soft, non-tender, non-distended ?MUSCULOSKELETAL:  No edema; No deformity  ?SKIN: Warm and dry ?NEUROLOGIC:  Alert and oriented x 3 ?PSYCHIATRIC:  Normal affect

## 2022-02-10 NOTE — Progress Notes (Signed)
?  Echocardiogram ?2D Echocardiogram has been performed. ? ?[name] ?02/10/2022, 1:20 PM ?

## 2022-02-10 NOTE — Patient Instructions (Signed)
Medication Instructions:  ?Your physician recommends that you continue on your current medications as directed. Please refer to the Current Medication list given to you today. ? ?*If you need a refill on your cardiac medications before your next appointment, please call your pharmacy* ? ? ?Lab Work: ?None ?If you have labs (blood work) drawn today and your tests are completely normal, you will receive your results only by: ?MyChart Message (if you have MyChart) OR ?A paper copy in the mail ?If you have any lab test that is abnormal or we need to change your treatment, we will call you to review the results. ? ? ?Follow-Up: ?At [facility], you and your health needs are our priority.  As part of our continuing mission to provide you with exceptional heart care, we have created designated Provider Care Teams.  These Care Teams include your primary Cardiologist (physician) and Advanced Practice Providers (APPs -  Physician Assistants and Nurse Practitioners) who all work together to provide you with the care you need, when you need it. ? ?We recommend signing up for the patient portal called "MyChart".  Sign up information is provided on this After Visit Summary.  MyChart is used to connect with patients for Virtual Visits (Telemedicine).  Patients are able to view lab/test results, encounter notes, upcoming appointments, etc.  Non-urgent messages can be sent to your provider as well.   ?To learn more about what you can do with MyChart, go to [email or web address].   ? ?Your next appointment:   ?1 year(s) ? ?The format for your next appointment:   ?In Person ? ?Provider:   ?You may see Vickie Epley, MD or one of the following Advanced Practice Providers on your designated Care Team:   ?Tommye Standard, PA-C ?Legrand Como "Jonni Sanger" [place], PA-C  ? ? ?

## 2022-02-20 ENCOUNTER — Ambulatory Visit (INDEPENDENT_AMBULATORY_CARE_PROVIDER_SITE_OTHER): Payer: BC Managed Care – PPO

## 2022-02-20 DIAGNOSIS — I472 Ventricular tachycardia, unspecified: Secondary | ICD-10-CM | POA: Diagnosis not present

## 2022-02-21 LAB — CUP PACEART REMOTE DEVICE CHECK
Battery Remaining Longevity: 150 mo
Battery Remaining Percentage: 100 %
Brady Statistic RA Percent Paced: 15 %
Brady Statistic RV Percent Paced: 0 %
Date Time Interrogation Session: 20230417012100
HighPow Impedance: 76 Ohm
Implantable Lead Implant Date: 20220415
Implantable Lead Implant Date: 20220415
Implantable Lead Location: 753859
Implantable Lead Location: 753860
Implantable Lead Model: 672
Implantable Lead Model: 7841
Implantable Lead Serial Number: 1134026
Implantable Lead Serial Number: 165867
Implantable Pulse Generator Implant Date: 20220415
Lead Channel Impedance Value: 619 Ohm
Lead Channel Impedance Value: 666 Ohm
Lead Channel Setting Pacing Amplitude: 2 V
Lead Channel Setting Pacing Amplitude: 2 V
Lead Channel Setting Pacing Pulse Width: 0.4 ms
Lead Channel Setting Sensing Sensitivity: 0.5 mV
Pulse Gen Serial Number: 597305

## 2022-02-27 ENCOUNTER — Encounter (HOSPITAL_COMMUNITY): Payer: Self-pay | Admitting: Internal Medicine

## 2022-03-08 NOTE — Progress Notes (Signed)
Remote ICD transmission.   

## 2022-03-13 ENCOUNTER — Telehealth (HOSPITAL_COMMUNITY): Payer: Self-pay

## 2022-03-13 NOTE — Telephone Encounter (Signed)
Received a fax requesting medical records from [facility]. Records were successfully faxed to: [phone] ,which was the number provided.. Medical request form will be scanned into patients chart.  ? ?

## 2022-03-15 ENCOUNTER — Encounter: Payer: Self-pay | Admitting: Cardiology

## 2022-03-16 ENCOUNTER — Telehealth (HOSPITAL_COMMUNITY): Payer: Self-pay

## 2022-03-16 NOTE — Telephone Encounter (Signed)
Received a fax requesting medical records from [facility]. Records were successfully faxed to: [phone] ,which was the number provided.. Medical request form will be scanned into patients chart.  ? ? ?

## 2022-03-22 ENCOUNTER — Encounter (HOSPITAL_COMMUNITY): Payer: Self-pay | Admitting: Internal Medicine

## 2022-03-23 ENCOUNTER — Other Ambulatory Visit (HOSPITAL_COMMUNITY): Payer: Self-pay | Admitting: Internal Medicine

## 2022-03-27 ENCOUNTER — Other Ambulatory Visit: Payer: Self-pay | Admitting: *Deleted

## 2022-03-27 MED ORDER — POTASSIUM CHLORIDE CRYS ER 20 MEQ PO TBCR
20.0000 meq | EXTENDED_RELEASE_TABLET | Freq: Every day | ORAL | 3 refills | Status: DC
Start: 2022-03-27 — End: 2022-03-29

## 2022-03-29 ENCOUNTER — Other Ambulatory Visit: Payer: Self-pay

## 2022-03-29 MED ORDER — POTASSIUM CHLORIDE CRYS ER 20 MEQ PO TBCR
20.0000 meq | EXTENDED_RELEASE_TABLET | Freq: Every day | ORAL | 3 refills | Status: DC
Start: 2022-03-29 — End: 2022-09-05

## 2022-04-04 ENCOUNTER — Encounter: Payer: Self-pay | Admitting: Cardiology

## 2022-04-11 ENCOUNTER — Encounter (HOSPITAL_COMMUNITY): Payer: Self-pay | Admitting: Internal Medicine

## 2022-04-14 ENCOUNTER — Encounter (HOSPITAL_COMMUNITY): Payer: Self-pay | Admitting: *Deleted

## 2022-05-22 ENCOUNTER — Ambulatory Visit (INDEPENDENT_AMBULATORY_CARE_PROVIDER_SITE_OTHER): Payer: BC Managed Care – PPO

## 2022-05-22 DIAGNOSIS — I428 Other cardiomyopathies: Secondary | ICD-10-CM

## 2022-05-24 ENCOUNTER — Encounter: Payer: Self-pay | Admitting: Cardiology

## 2022-05-24 LAB — CUP PACEART REMOTE DEVICE CHECK
Battery Remaining Longevity: 156 mo
Battery Remaining Percentage: 100 %
Brady Statistic RA Percent Paced: 21 %
Brady Statistic RV Percent Paced: 0 %
Date Time Interrogation Session: 20230718231100
HighPow Impedance: 83 Ohm
Implantable Lead Implant Date: 20220415
Implantable Lead Implant Date: 20220415
Implantable Lead Location: 753859
Implantable Lead Location: 753860
Implantable Lead Model: 672
Implantable Lead Model: 7841
Implantable Lead Serial Number: 1134026
Implantable Lead Serial Number: 165867
Implantable Pulse Generator Implant Date: 20220415
Lead Channel Impedance Value: 514 Ohm
Lead Channel Impedance Value: 597 Ohm
Lead Channel Setting Pacing Amplitude: 2 V
Lead Channel Setting Pacing Amplitude: 2 V
Lead Channel Setting Pacing Pulse Width: 0.4 ms
Lead Channel Setting Sensing Sensitivity: 0.5 mV
Pulse Gen Serial Number: 597305

## 2022-05-25 ENCOUNTER — Encounter (HOSPITAL_COMMUNITY): Payer: Self-pay | Admitting: Internal Medicine

## 2022-05-26 ENCOUNTER — Other Ambulatory Visit (HOSPITAL_COMMUNITY): Payer: Self-pay | Admitting: *Deleted

## 2022-05-26 MED ORDER — ENTRESTO 24-26 MG PO TABS: 1.0000 | ORAL_TABLET | Freq: Two times a day (BID) | ORAL | 3 refills | Status: DC

## 2022-05-29 ENCOUNTER — Other Ambulatory Visit (HOSPITAL_COMMUNITY): Payer: Self-pay | Admitting: *Deleted

## 2022-05-29 MED ORDER — FUROSEMIDE 20 MG PO TABS: 20.0000 mg | ORAL_TABLET | Freq: Every day | ORAL | 0 refills | Status: DC | PRN

## 2022-05-29 NOTE — Telephone Encounter (Signed)
Spoke with pt she said she feels a little more short of breath with exertion but otherwise feels normal. Pt does not have lasix and asked that we send script to [facility] in [place].

## 2022-05-29 NOTE — Telephone Encounter (Signed)
Updated HF measurements

## 2022-05-29 NOTE — Telephone Encounter (Signed)
Spoke with pt she is aware and thanked me for the call.

## 2022-05-30 ENCOUNTER — Other Ambulatory Visit (HOSPITAL_COMMUNITY): Payer: Self-pay | Admitting: *Deleted

## 2022-05-30 MED ORDER — ENTRESTO 24-26 MG PO TABS
1.0000 | ORAL_TABLET | Freq: Two times a day (BID) | ORAL | 3 refills | Status: DC
Start: 2022-05-30 — End: 2022-09-05

## 2022-06-01 ENCOUNTER — Telehealth (HOSPITAL_COMMUNITY): Payer: Self-pay | Admitting: *Deleted

## 2022-06-01 NOTE — Telephone Encounter (Signed)
Error

## 2022-06-01 NOTE — Telephone Encounter (Signed)
[facility] report

## 2022-06-23 NOTE — Progress Notes (Signed)
Remote ICD transmission.   

## 2022-06-28 ENCOUNTER — Encounter (HOSPITAL_COMMUNITY): Payer: Self-pay | Admitting: Internal Medicine

## 2022-07-04 ENCOUNTER — Other Ambulatory Visit (HOSPITAL_COMMUNITY): Payer: Self-pay | Admitting: *Deleted

## 2022-07-04 MED ORDER — FUROSEMIDE 20 MG PO TABS: 20.0000 mg | ORAL_TABLET | Freq: Every day | ORAL | 3 refills | Status: DC

## 2022-08-07 ENCOUNTER — Encounter (HOSPITAL_COMMUNITY): Payer: Self-pay | Admitting: Internal Medicine

## 2022-08-14 ENCOUNTER — Encounter (HOSPITAL_COMMUNITY): Payer: Self-pay | Admitting: Internal Medicine

## 2022-08-15 ENCOUNTER — Other Ambulatory Visit (HOSPITAL_COMMUNITY): Payer: Self-pay | Admitting: Internal Medicine

## 2022-08-21 ENCOUNTER — Ambulatory Visit (INDEPENDENT_AMBULATORY_CARE_PROVIDER_SITE_OTHER): Payer: BC Managed Care – PPO

## 2022-08-21 DIAGNOSIS — I428 Other cardiomyopathies: Secondary | ICD-10-CM | POA: Diagnosis not present

## 2022-08-22 LAB — CUP PACEART REMOTE DEVICE CHECK
Battery Remaining Longevity: 150 mo
Battery Remaining Percentage: 100 %
Brady Statistic RA Percent Paced: 15 %
Brady Statistic RV Percent Paced: 0 %
Date Time Interrogation Session: 20231016012000
HighPow Impedance: 82 Ohm
Implantable Lead Implant Date: 20220415
Implantable Lead Implant Date: 20220415
Implantable Lead Location: 753859
Implantable Lead Location: 753860
Implantable Lead Model: 672
Implantable Lead Model: 7841
Implantable Lead Serial Number: 1134026
Implantable Lead Serial Number: 165867
Implantable Pulse Generator Implant Date: 20220415
Lead Channel Impedance Value: 487 Ohm
Lead Channel Impedance Value: 540 Ohm
Lead Channel Setting Pacing Amplitude: 2 V
Lead Channel Setting Pacing Amplitude: 2 V
Lead Channel Setting Pacing Pulse Width: 0.4 ms
Lead Channel Setting Sensing Sensitivity: 0.5 mV
Pulse Gen Serial Number: 597305

## 2022-08-30 ENCOUNTER — Encounter (HOSPITAL_COMMUNITY): Payer: Self-pay | Admitting: Internal Medicine

## 2022-08-31 ENCOUNTER — Other Ambulatory Visit (HOSPITAL_COMMUNITY): Payer: Self-pay | Admitting: *Deleted

## 2022-08-31 DIAGNOSIS — I428 Other cardiomyopathies: Secondary | ICD-10-CM

## 2022-08-31 DIAGNOSIS — I5022 Chronic systolic (congestive) heart failure: Secondary | ICD-10-CM

## 2022-09-01 ENCOUNTER — Encounter (HOSPITAL_COMMUNITY): Payer: Self-pay | Admitting: Internal Medicine

## 2022-09-01 ENCOUNTER — Ambulatory Visit (HOSPITAL_COMMUNITY)
Admission: RE | Admit: 2022-09-01 | Discharge: 2022-09-01 | Disposition: A | Discharge status: Home / Self Care | Payer: BC Managed Care – PPO | Attending: Internal Medicine | Admitting: Internal Medicine

## 2022-09-01 ENCOUNTER — Encounter (HOSPITAL_COMMUNITY)
Admission: RE | Disposition: A | Discharge status: Home / Self Care | Payer: Self-pay | Source: Home / Self Care | Attending: Internal Medicine

## 2022-09-01 ENCOUNTER — Other Ambulatory Visit (HOSPITAL_COMMUNITY): Payer: Self-pay | Admitting: *Deleted

## 2022-09-01 ENCOUNTER — Encounter (HOSPITAL_COMMUNITY): Payer: Self-pay | Admitting: *Deleted

## 2022-09-01 DIAGNOSIS — Z95810 Presence of automatic (implantable) cardiac defibrillator: Secondary | ICD-10-CM | POA: Diagnosis not present

## 2022-09-01 DIAGNOSIS — Z6836 Body mass index (BMI) 36.0-36.9, adult: Secondary | ICD-10-CM | POA: Insufficient documentation

## 2022-09-01 DIAGNOSIS — Z86718 Personal history of other venous thrombosis and embolism: Secondary | ICD-10-CM | POA: Diagnosis not present

## 2022-09-01 DIAGNOSIS — E669 Obesity, unspecified: Secondary | ICD-10-CM | POA: Diagnosis not present

## 2022-09-01 DIAGNOSIS — I4721 Torsades de pointes: Secondary | ICD-10-CM | POA: Diagnosis not present

## 2022-09-01 DIAGNOSIS — I5022 Chronic systolic (congestive) heart failure: Secondary | ICD-10-CM | POA: Diagnosis present

## 2022-09-01 DIAGNOSIS — Z79899 Other long term (current) drug therapy: Secondary | ICD-10-CM | POA: Insufficient documentation

## 2022-09-01 DIAGNOSIS — Z7901 Long term (current) use of anticoagulants: Secondary | ICD-10-CM | POA: Insufficient documentation

## 2022-09-01 DIAGNOSIS — I428 Other cardiomyopathies: Secondary | ICD-10-CM | POA: Insufficient documentation

## 2022-09-01 HISTORY — PX: RIGHT HEART CATH: CATH118263

## 2022-09-01 LAB — POCT I-STAT EG7
Acid-base deficit: 3 mmol/L — ABNORMAL HIGH (ref 0.0–2.0)
Acid-base deficit: 3 mmol/L — ABNORMAL HIGH (ref 0.0–2.0)
Acid-base deficit: 5 mmol/L — ABNORMAL HIGH (ref 0.0–2.0)
Bicarbonate: 19 mmol/L — ABNORMAL LOW (ref 20.0–28.0)
Bicarbonate: 21.9 mmol/L (ref 20.0–28.0)
Bicarbonate: 21.9 mmol/L (ref 20.0–28.0)
Calcium, Ion: 0.86 mmol/L — CL (ref 1.15–1.40)
Calcium, Ion: 1.16 mmol/L (ref 1.15–1.40)
Calcium, Ion: 1.16 mmol/L (ref 1.15–1.40)
HCT: 33 % — ABNORMAL LOW (ref 36.0–46.0)
HCT: 38 % (ref 36.0–46.0)
HCT: 39 % (ref 36.0–46.0)
Hemoglobin: 11.2 g/dL — ABNORMAL LOW (ref 12.0–15.0)
Hemoglobin: 12.9 g/dL (ref 12.0–15.0)
Hemoglobin: 13.3 g/dL (ref 12.0–15.0)
O2 Saturation: 66 %
O2 Saturation: 66 %
O2 Saturation: 68 %
Potassium: 3 mmol/L — ABNORMAL LOW (ref 3.5–5.1)
Potassium: 3.8 mmol/L (ref 3.5–5.1)
Potassium: 3.9 mmol/L (ref 3.5–5.1)
Sodium: 139 mmol/L (ref 135–145)
Sodium: 139 mmol/L (ref 135–145)
Sodium: 146 mmol/L — ABNORMAL HIGH (ref 135–145)
TCO2: 20 mmol/L — ABNORMAL LOW (ref 22–32)
TCO2: 23 mmol/L (ref 22–32)
TCO2: 23 mmol/L (ref 22–32)
pCO2, Ven: 32.5 mmHg — ABNORMAL LOW (ref 44–60)
pCO2, Ven: 36.8 mmHg — ABNORMAL LOW (ref 44–60)
pCO2, Ven: 38 mmHg — ABNORMAL LOW (ref 44–60)
pH, Ven: 7.369 (ref 7.25–7.43)
pH, Ven: 7.375 (ref 7.25–7.43)
pH, Ven: 7.382 (ref 7.25–7.43)
pO2, Ven: 35 mmHg (ref 32–45)
pO2, Ven: 35 mmHg (ref 32–45)
pO2, Ven: 36 mmHg (ref 32–45)

## 2022-09-01 LAB — CBC
HCT: 40 % (ref 36.0–46.0)
Hemoglobin: 13.2 g/dL (ref 12.0–15.0)
MCH: 28.3 pg (ref 26.0–34.0)
MCHC: 33 g/dL (ref 30.0–36.0)
MCV: 85.8 fL (ref 80.0–100.0)
Platelets: 271 10*3/uL (ref 150–400)
RBC: 4.66 MIL/uL (ref 3.87–5.11)
RDW: 14.9 % (ref 11.5–15.5)
WBC: 10.9 10*3/uL — ABNORMAL HIGH (ref 4.0–10.5)
nRBC: 0 % (ref 0.0–0.2)

## 2022-09-01 LAB — BASIC METABOLIC PANEL
Anion gap: 12 (ref 5–15)
BUN: 16 mg/dL (ref 6–20)
CO2: 21 mmol/L — ABNORMAL LOW (ref 22–32)
Calcium: 8.8 mg/dL — ABNORMAL LOW (ref 8.9–10.3)
Chloride: 106 mmol/L (ref 98–111)
Creatinine, Ser: 1.02 mg/dL — ABNORMAL HIGH (ref 0.44–1.00)
GFR, Estimated: >60 mL/min (ref >60)
Glucose, Bld: 101 mg/dL — ABNORMAL HIGH (ref 70–99)
Potassium: 3.7 mmol/L (ref 3.5–5.1)
Sodium: 139 mmol/L (ref 135–145)

## 2022-09-01 LAB — PREGNANCY, URINE: Preg Test, Ur: NEGATIVE

## 2022-09-01 SURGERY — RIGHT HEART CATH
Anesthesia: LOCAL

## 2022-09-01 MED ORDER — MIDAZOLAM HCL 2 MG/2ML IJ SOLN
INTRAMUSCULAR | Status: DC | PRN
  Administered 2022-09-01: 1 mg via INTRAVENOUS

## 2022-09-01 MED ORDER — FENTANYL CITRATE (PF) 100 MCG/2ML IJ SOLN
INTRAMUSCULAR | Status: AC
  Filled 2022-09-01: qty 2

## 2022-09-01 MED ORDER — SODIUM CHLORIDE 0.9% FLUSH: 3.0000 mL | INTRAVENOUS | Status: DC | PRN

## 2022-09-01 MED ORDER — LABETALOL HCL 5 MG/ML IV SOLN: 10.0000 mg | INTRAVENOUS | Status: DC | PRN

## 2022-09-01 MED ORDER — METOPROLOL SUCCINATE ER 100 MG PO TB24: 50.0000 mg | ORAL_TABLET | Freq: Every day | ORAL | 3 refills | Status: DC

## 2022-09-01 MED ORDER — MIDAZOLAM HCL 2 MG/2ML IJ SOLN
INTRAMUSCULAR | Status: AC
  Filled 2022-09-01: qty 2

## 2022-09-01 MED ORDER — HYDRALAZINE HCL 20 MG/ML IJ SOLN: 10.0000 mg | INTRAMUSCULAR | Status: DC | PRN

## 2022-09-01 MED ORDER — SODIUM CHLORIDE 0.9% FLUSH: 3.0000 mL | Freq: Two times a day (BID) | INTRAVENOUS | Status: DC

## 2022-09-01 MED ORDER — FENTANYL CITRATE (PF) 100 MCG/2ML IJ SOLN
INTRAMUSCULAR | Status: DC | PRN
  Administered 2022-09-01: 25 ug via INTRAVENOUS

## 2022-09-01 MED ORDER — LIDOCAINE HCL (PF) 1 % IJ SOLN
INTRAMUSCULAR | Status: DC | PRN
  Administered 2022-09-01: 2 mL

## 2022-09-01 MED ORDER — SODIUM CHLORIDE 0.9 % IV SOLN: 250.0000 mL | INTRAVENOUS | Status: DC | PRN

## 2022-09-01 MED ORDER — HEPARIN (PORCINE) IN NACL 1000-0.9 UT/500ML-% IV SOLN
INTRAVENOUS | Status: AC
  Filled 2022-09-01: qty 500

## 2022-09-01 MED ORDER — HEPARIN (PORCINE) IN NACL 1000-0.9 UT/500ML-% IV SOLN
INTRAVENOUS | Status: DC | PRN
  Administered 2022-09-01: 500 mL

## 2022-09-01 MED ORDER — ACETAMINOPHEN 325 MG PO TABS: 650.0000 mg | ORAL_TABLET | ORAL | Status: DC | PRN

## 2022-09-01 MED ORDER — ASPIRIN 81 MG PO CHEW
81.0000 mg | CHEWABLE_TABLET | ORAL | Status: AC
  Administered 2022-09-01: 81 mg via ORAL
  Filled 2022-09-01: qty 1

## 2022-09-01 MED ORDER — SODIUM CHLORIDE 0.9 % IV SOLN: INTRAVENOUS | Status: DC

## 2022-09-01 MED ORDER — LIDOCAINE HCL (PF) 1 % IJ SOLN
INTRAMUSCULAR | Status: AC
  Filled 2022-09-01: qty 30

## 2022-09-01 SURGICAL SUPPLY — 7 items
CATH SWAN GANZ 7F STRAIGHT (CATHETERS) IMPLANT
GLIDESHEATH SLENDER 7FR .021G (SHEATH) IMPLANT
GUIDEWIRE .025 260CM (WIRE) IMPLANT
PACK CARDIAC CATHETERIZATION (CUSTOM PROCEDURE TRAY) ×1 IMPLANT
PROTECTION STATION PRESSURIZED (MISCELLANEOUS) ×1
STATION PROTECTION PRESSURIZED (MISCELLANEOUS) IMPLANT
TRANSDUCER W/STOPCOCK (MISCELLANEOUS) ×1 IMPLANT

## 2022-09-01 NOTE — Progress Notes (Signed)
Medication Samples have been provided to the patient.  Drug name: Jardiance       Strength: 10mg         Qty: 2 bottles   LOT: [id number]  Exp.Date: 11/25  Dosing instructions: take 1 tablet daily   The patient has been instructed regarding the correct time, dose, and frequency of taking this medication, including desired effects and most common side effects.   Ameera Tigue M Yoav Okane 9:25 AM 09/01/2022

## 2022-09-01 NOTE — H&P (Signed)
[facility] H&  PCP: Patient, No Pcp Per Primary Cardiologist: Little Ishikawa, MD  HPI:  [name] is a 26 y.o. 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 Had COVID-19 10/21 (vaccianted not boosted).   She presented to the ED in [place] on 11/11/2020 with CP and palpitations. Chest CT no evidence of PE.  Hs trops (72 > 73 > 68).  Echo1/28/2022  EF 15-20% normal RV. Holter with rare ectopy and 6 beats run of NSVT. Myoview with anterior defect, Cath 01/10/2021 showed normal coronary arteries, normal LVEDP.     cMRI 01/26/21 LVEF 31% markedly dilated No significant LGE. RV normal. No non-compaction or other pathology  I saw her in 3/22 and placed a Zio patch to quantify PVC burden. Zio showed multiple short runs VT/TdP. Admitted for ICD. CRT-D placed 02/18/21 with RA lead to overdrive pace given TdP occurred during nighttime bradycardia .  4/22 found to have LUE DVT  Found to have RBM20 gene, namely c.1907G>A, P.Arg636His. This is a variant associated with Dilated Cardiomyopathy (DCM) with patients typically having an early age of diagnosis, end stage heart failure and at risk of adverse events. The gene is inherited in an autosomal dominant manner and the rate of de novo mutations in RBM20 is very low.   Echo 8/22 25-30% RV normal Echo 4/23 EF 25%   Has been struggling lately with NYHA IIIB-IV symptoms and volume overload. Not responding to increasing lasix. No syncope, presyncope. Initial plan for CPX test but continued to decline. [place], Echo 10/23 EF 15%  Presents to day for RHC   Her youngest sister had echo which was normal. Other sister has not been echoed yet. Family has not had genetic testing yet.    ROS: All systems negative except as listed in HPI, PMH and Problem List.  SH:  Social History    Socioeconomic History   Marital status: Single    Spouse name: Not on file   Number of children: Not on file   Years of education: Not on file   Highest education level: Not on file  Occupational History   Not on file  Tobacco Use   Smoking status: Never   Smokeless tobacco: Never  Substance and Sexual Activity   Alcohol use: No   Drug use: No   Sexual activity: Never  Other Topics Concern   Not on file  Social History Narrative   [employer] high[employer]   10th grade.   volleyball   Social Determinants of Corporate investment banker Strain: Not on file  Food Insecurity: Not on file  Transportation Needs: Not on file  Physical Activity: Not on file  Stress: Not on file  Social Connections: Not on file  Intimate Partner Violence: Not on file    FH:  Family History  Problem Relation Age of Onset   Healthy Mother    Hyperlipidemia Father    Healthy Sister    Healthy Sister    Heart attack Maternal Grandmother    Hyperlipidemia Maternal Grandfather     Past Medical History:  Diagnosis Date   Lactose intolerance    NICM (nonischemic cardiomyopathy) ([facility])    PVC's (premature ventricular contractions)    Torsades de pointes ([facility])     Current Facility-Administered Medications  Medication Dose Route Frequency Provider Last Rate Last  Admin   0.9 %  sodium chloride infusion  250 mL Intravenous PRN Shenoa Hattabaugh, Shaune Pascal, MD       0.9 %  sodium chloride infusion   Intravenous Continuous Rhealyn Cullen, Shaune Pascal, MD 10 mL/hr at 09/01/22 0642 New Bag at 09/01/22 7673   sodium chloride flush (NS) 0.9 % injection 3 mL  3 mL Intravenous Q12H Cornesha Radziewicz, Shaune Pascal, MD       sodium chloride flush (NS) 0.9 % injection 3 mL  3 mL Intravenous PRN Avett Reineck, Shaune Pascal, MD        Vitals:   09/01/22 0621  BP: 94/64  Pulse: 96  Resp: 16  Temp: 98 F (36.7 C)  TempSrc: Temporal  SpO2: 98%  Weight: 102.1 kg  Height: 5\' 6"  (1.676 m)   Wt Readings from Last 3 Encounters:  09/01/22  102.1 kg  02/10/22 96.7 kg  02/10/22 96.8 kg     PHYSICAL EXAM: General:  Well appearing. No resp difficulty HEENT: normal Neck: supple. no JVD. Carotids 2+ bilat; no bruits. No lymphadenopathy or thryomegaly appreciated. Cor: PMI laterally displaced. Regular rate & rhythm. No rubs, gallops or murmurs. Lungs: clear Abdomen: soft, nontender, nondistended. No hepatosplenomegaly. No bruits or masses. Good bowel sounds. Extremities: no cyanosis, clubbing, rash, edema Neuro: alert & orientedx3, cranial nerves grossly intact. moves all 4 extremities w/o difficulty. Affect pleasant   ICD interrogation: HL Logic up No VT Personally reviewed  ASSESSMENT & PLAN:  1. Chronic HFrEF - due to NICM. Suspect viral (? COVID) myocarditis - Echo 1/22 EF 15- 20% - [place] showed normal coronary arteries, normal LVEDP.  - cMRI 01/26/21 LVEF 31% markedly dilated No significant LGE. RV normal. No non-compaction or other pathology - Echo 4/22 EF 25% (I felt 30-35%) - Echo 8/22 EF 25-30% (stable) - s/p CRT-D 4/22. ICD interrogation as above  - Echo 8/22 25-30% RV normal - Echo 4/23 EF 25% - Echo 10/23 EF 15% - Genetic w/u shows RBM20 gene, namely c.1907G>A, P.Arg636His. This is a variant associated with Dilated Cardiomyopathy (DCM) with patients typically having an early age of diagnosis, end stage heart failure and at risk of adverse events. The gene is inherited in an autosomal dominant manner and the rate of de novo mutations in RBM20 is very low.  - NYHA IIIB-IV - Volume up  - Continue Jardiance 10 (has only had 1 further yeast infection) - Continue Entresto 24/26 bid - Continue spiro 12.5 - Conitnue - For RHC today. Will likely need transplant soon  2.Torsades/VT - Zio 3/22 with multiple episodes VT/TdP. PVCs 1% - CRT-D placed 02/18/21 with RA lead to overdrive pace given TdP occurred during nighttime bradycardia. Followed by Dr. Quentin Ore - ICD interrogated today. No VT - Follows  with Dr. Quentin Ore  3. LUE DVT - u/s 4/22 - On Eliquis 2.5 bid per [employer]-EXT data - No bleeding  4. Obesity -working on weight loss  Glori Bickers, MD  7:36 AM

## 2022-09-05 ENCOUNTER — Ambulatory Visit: Payer: BC Managed Care – PPO | Attending: Internal Medicine | Admitting: Internal Medicine

## 2022-09-05 ENCOUNTER — Other Ambulatory Visit
Admission: RE | Admit: 2022-09-05 | Discharge: 2022-09-05 | Disposition: A | Discharge status: Home / Self Care | Payer: BC Managed Care – PPO | Source: Ambulatory Visit | Attending: Internal Medicine | Admitting: Internal Medicine

## 2022-09-05 ENCOUNTER — Encounter: Payer: Self-pay | Admitting: Internal Medicine

## 2022-09-05 VITALS — BP 103/74 | HR 102 | Ht 66.0 in | Wt 225.0 lb

## 2022-09-05 DIAGNOSIS — Z4502 Encounter for adjustment and management of automatic implantable cardiac defibrillator: Secondary | ICD-10-CM | POA: Diagnosis not present

## 2022-09-05 DIAGNOSIS — R0609 Other forms of dyspnea: Secondary | ICD-10-CM | POA: Diagnosis present

## 2022-09-05 DIAGNOSIS — E669 Obesity, unspecified: Secondary | ICD-10-CM | POA: Diagnosis not present

## 2022-09-05 DIAGNOSIS — I493 Ventricular premature depolarization: Secondary | ICD-10-CM | POA: Insufficient documentation

## 2022-09-05 DIAGNOSIS — I472 Ventricular tachycardia, unspecified: Secondary | ICD-10-CM | POA: Diagnosis not present

## 2022-09-05 DIAGNOSIS — I4721 Torsades de pointes: Secondary | ICD-10-CM | POA: Insufficient documentation

## 2022-09-05 DIAGNOSIS — Z7901 Long term (current) use of anticoagulants: Secondary | ICD-10-CM | POA: Diagnosis not present

## 2022-09-05 DIAGNOSIS — Z8616 Personal history of COVID-19: Secondary | ICD-10-CM | POA: Diagnosis not present

## 2022-09-05 DIAGNOSIS — I5022 Chronic systolic (congestive) heart failure: Secondary | ICD-10-CM | POA: Insufficient documentation

## 2022-09-05 DIAGNOSIS — Z95810 Presence of automatic (implantable) cardiac defibrillator: Secondary | ICD-10-CM

## 2022-09-05 LAB — BASIC METABOLIC PANEL
Anion gap: 9 (ref 5–15)
BUN: 13 mg/dL (ref 6–20)
CO2: 24 mmol/L (ref 22–32)
Calcium: 8.8 mg/dL — ABNORMAL LOW (ref 8.9–10.3)
Chloride: 105 mmol/L (ref 98–111)
Creatinine, Ser: 0.9 mg/dL (ref 0.44–1.00)
GFR, Estimated: >60 mL/min (ref >60)
Glucose, Bld: 94 mg/dL (ref 70–99)
Potassium: 3.8 mmol/L (ref 3.5–5.1)
Sodium: 138 mmol/L (ref 135–145)

## 2022-09-05 LAB — BRAIN NATRIURETIC PEPTIDE: B Natriuretic Peptide: 821.5 pg/mL — ABNORMAL HIGH (ref 0.0–100.0)

## 2022-09-05 NOTE — Progress Notes (Signed)
ADVANCED HF CLINIC NOTE  PCP: Patient, No Pcp Per Primary Cardiologist: Donato Heinz, MD  HPI:  [name] is a 26 y.o. 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 Had COVID-19 10/21 (vaccianted not boosted).   She presented to the ED in [place] on 11/11/2020 with CP and palpitations. Chest CT no evidence of PE.  Hs trops (72 > 73 > 68).  Echo1/28/2022  EF 15-20% normal RV. Holter with rare ectopy and 6 beats run of NSVT. Myoview with anterior defect, Cath 01/10/2021 showed normal coronary arteries, normal LVEDP.     cMRI 01/26/21 LVEF 31% markedly dilated No significant LGE. RV normal. No non-compaction or other pathology  I saw her in 3/22 and placed a Zio patch to quantify PVC burden. Zio showed multiple short runs VT/TdP. Admitted for ICD. CRT-D placed 02/18/21 with RA lead to overdrive pace given TdP occurred during nighttime bradycardia .  4/22 found to have LUE DVT  Has been seen by Dr. Broadus John (Genetics). 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 This is a variant associated with Dilated Cardiomyopathy (DCM) with patients typically having an early age of diagnosis, end stage heart failure and at risk of adverse events. The gene is inherited in an autosomal dominant manner and the rate of de novo mutations in RBM20 is very low.   Echo 8/22 25-30% RV normal Personally reviewed Echo 4/23 EF 25%   Has been struggling lately with NYHA IIIB-IV symptoms and volume overload. Not responding to increasing lasix. No syncope, presyncope. Initial plan for CPX test but continued to decline. Saw cardiologist in [place], Echo 10/23 EF 15%  Had [place] 10/27 as below. Volume ok. Output marginal. CPX test pending. Entresto and Toprol decreased. Continues with easy fatigue. Legs tire quickly. + DOE. No edema, orthopnea or PND.   [place]  09/01/22  RA = 8 RV = 40/13 PA = 36/17 (29) PCW = 19 Fick cardiac output/index = 5.8/2.7 Thermo CO/CI = 4.7/2.2 PVR = 1.7 (TD) 1.4 (Fick) Ao sat = 93% PA sat = 66%, 66% High SVC = 68% PAPi = 2.4  ROS: All systems negative except as listed in HPI, PMH and Problem List.  SH:  Social History   Socioeconomic History   Marital status: Single    Spouse name: Not on file   Number of children: Not on file   Years of education: Not on file   Highest education level: Not on file  Occupational History   Not on file  Tobacco Use   Smoking status: Never   Smokeless tobacco: Never  Substance and Sexual Activity   Alcohol use: No   Drug use: No   Sexual activity: Never  Other Topics Concern   Not on file  Social History Narrative   [employer] highschool   10th grade.   volleyball   Social Determinants of Radio broadcast assistant Strain: Not on file  Food Insecurity: Not on file  Transportation Needs: Not on file  Physical Activity: Not on file  Stress: Not on file  Social Connections: Not on file  Intimate Partner Violence: Not on file    FH:  Family History  Problem Relation Age of Onset   Healthy Mother    Hyperlipidemia Father    Healthy Sister    Healthy Sister    Heart attack Maternal Grandmother  Hyperlipidemia Maternal Grandfather     Past Medical History:  Diagnosis Date   Lactose intolerance    NICM (nonischemic cardiomyopathy) (HCC)    PVC's (premature ventricular contractions)    Torsades de pointes (HCC)     Current Outpatient Medications  Medication Sig Dispense Refill   apixaban (ELIQUIS) 2.5 MG TABS tablet Take 1 tablet (2.5 mg total) by mouth 2 (two) times daily. 90 tablet 6   buPROPion (WELLBUTRIN SR) 200 MG 12 hr tablet Take 200 mg by mouth daily.     empagliflozin (JARDIANCE) 10 MG TABS tablet Take 1 tablet (10 mg total) by mouth daily. 30 tablet 11   fluconazole (DIFLUCAN) 150 MG tablet Take 1 tablet (150 mg total) by mouth as  needed. (Patient taking differently: Take 150 mg by mouth as needed (yeast infection).) 30 tablet 0   furosemide (LASIX) 40 MG tablet Take 40 mg by mouth daily.     metoprolol succinate (TOPROL XL) 100 MG 24 hr tablet Take 0.5 tablets (50 mg total) by mouth daily. Take with or immediately following a meal. 135 tablet 3   potassium chloride SA (KLOR-CON M) 20 MEQ tablet Take 20 mEq by mouth 2 (two) times daily.     sacubitril-valsartan (ENTRESTO) 24-26 MG Take 0.5 tablets by mouth 2 (two) times daily.     sertraline (ZOLOFT) 100 MG tablet Take 100 mg by mouth daily.     spironolactone (ALDACTONE) 25 MG tablet TAKE 0.5 TABLETS BY MOUTH AT BEDTIME. 45 tablet 3   TRI-ESTARYLLA 0.18/0.215/0.25 MG-35 MCG tablet Take 1 tablet by mouth daily.     valACYclovir (VALTREX) 1000 MG tablet Take 1,000 mg by mouth as needed for rash.     No current facility-administered medications for this visit.    Vitals:   09/05/22 1020  BP: 103/74  Pulse: (!) 102  SpO2: 98%  Weight: 225 lb (102.1 kg)  Height: 5\' 6"  (1.676 m)   Wt Readings from Last 3 Encounters:  09/05/22 225 lb (102.1 kg)  09/01/22 225 lb (102.1 kg)  02/10/22 213 lb 3.2 oz (96.7 kg)     PHYSICAL EXAM: General:  Well appearing. No resp difficulty HEENT: normal Neck: supple. no JVD. Carotids 2+ bilat; no bruits. No lymphadenopathy or thryomegaly appreciated. Cor: PMI nondisplaced. Regular rate & rhythm. No rubs, gallops or murmurs. Lungs: clear Abdomen: obese soft, nontender, nondistended. No hepatosplenomegaly. No bruits or masses. Good bowel sounds. Extremities: no cyanosis, clubbing, rash, edema Neuro: alert & orientedx3, cranial nerves grossly intact. moves all 4 extremities w/o difficulty. Affect pleasant   ICD interrogation: HL Logic 23 No VT/AF Personally reviewed  ASSESSMENT & PLAN:  1. Chronic HFrEF - due to NICM. Suspect viral (? COVID) myocarditis - Echo 1/22 EF 15- 20% - [place] showed normal coronary  arteries, normal LVEDP.  - cMRI 01/26/21 LVEF 31% markedly dilated No significant LGE. RV normal. No non-compaction or other pathology - Echo 4/22 EF 25% (I felt 30-35%) - Echo 8/22 EF 25-30% (stable) - s/p CRT-D 4/22. ICD interrogation as above. No VT/AF.  - Echo 4/23 EF 25% - Echo 10/23 EF 15% - Genetic w/u shows RBM20 gene, namely c.1907G>A, P.Arg636His. This is a variant associated with Dilated Cardiomyopathy (DCM) with patients typically having an early age of diagnosis, end stage heart failure and at risk of adverse events. The gene is inherited in an autosomal dominant manner and the rate of de novo mutations in RBM20 is very low.  - NYHA IIIB-IV -  Recent RHC 09/01/22 with mild to moderately reduced output -> will ned CPX to better risk stratify and assess timing of need for advanced therapies  - Volume ok  - Continue Jardiance 10 - Continue Entresto 12/13 bid (recently decreased from 12/13 bid)   - Continue spiro 12.5 - Continue Toprol 50 (recently decreased from 150 daily) - For CPX 11/13. Labs today    2.Torsades/VT - Zio 3/22 with multiple episodes VT/TdP. PVCs 1% - CRT-D placed 02/18/21 with RA lead to overdrive pace given TdP occurred during nighttime bradycardia. Followed by [name] - ICD interrogated today. No VT/AF - Follows with [name]  3. LUE DVT - u/s 4/22 - On Eliquis 2.5 bid per Amplify-EXT data - No bleeding  4. Obesity - discussed possible GLP1RA use  Total time spent 45 minutes. Over half that time spent discussing above.    Arvilla Meres, MD  10:49 AM

## 2022-09-05 NOTE — Patient Instructions (Signed)
Medication Changes:  No medication changes today.  Lab Work:  Labs done today, your results will be available in MyChart, we will contact you for abnormal readings.     Testing/Procedures:  Your physician has recommended that you have a cardiopulmonary stress test (CPX). CPX testing is a non-invasive measurement of heart and lung function. It replaces a traditional treadmill stress test. This type of test provides a tremendous amount of information that relates not only to your present condition but also for future outcomes. This test combines measurements of you ventilation, respiratory gas exchange in the lungs, electrocardiogram (EKG), blood pressure and physical response before, during, and following an exercise protocol.   Referrals:  None  Special Instructions // Education:  Do the following things EVERYDAY: Weigh yourself in the morning before breakfast. Write it down and keep it in a log. Take your medicines as prescribed Eat low salt foods--Limit salt (sodium) to 2000 mg per day.  Stay as active as you can everyday Limit all fluids for the day to less than 2 liters   Follow-Up in:  6 weeks   If you have any questions or concerns before your next appointment please send [place] a message through [place] or call our office at [phone]

## 2022-09-06 ENCOUNTER — Encounter: Payer: Self-pay | Admitting: Internal Medicine

## 2022-09-07 ENCOUNTER — Encounter: Payer: Self-pay | Admitting: Internal Medicine

## 2022-09-08 NOTE — Progress Notes (Signed)
Remote ICD transmission.   

## 2022-09-11 ENCOUNTER — Encounter: Payer: Self-pay | Admitting: Internal Medicine

## 2022-09-11 MED ORDER — FUROSEMIDE 40 MG PO TABS: 40.0000 mg | ORAL_TABLET | Freq: Every day | ORAL | 6 refills | Status: DC

## 2022-09-15 ENCOUNTER — Encounter (HOSPITAL_COMMUNITY): Payer: BC Managed Care – PPO

## 2022-09-18 ENCOUNTER — Ambulatory Visit (HOSPITAL_COMMUNITY): Payer: BC Managed Care – PPO | Attending: Internal Medicine

## 2022-09-18 ENCOUNTER — Encounter: Payer: Self-pay | Admitting: Internal Medicine

## 2022-09-18 DIAGNOSIS — I5022 Chronic systolic (congestive) heart failure: Secondary | ICD-10-CM

## 2022-09-20 ENCOUNTER — Encounter: Payer: Self-pay | Admitting: Internal Medicine

## 2022-09-21 ENCOUNTER — Telehealth: Payer: Self-pay | Admitting: Genetic Counselor

## 2022-09-21 NOTE — Telephone Encounter (Signed)
New Message:     Patient's Mother would like for Dr Jomarie Longs to order lab work, so she can have the Genetic testing.

## 2022-09-21 NOTE — Telephone Encounter (Signed)
Called pt mother who would like labs placed for Genetic testing.  Reports daughter had a work up with Dr. Jomarie Longs and it was recommended that parents have blood work. Advised will send message to Dr. Jomarie Longs to follow up.

## 2022-10-04 ENCOUNTER — Encounter: Payer: Self-pay | Admitting: Internal Medicine

## 2022-10-16 ENCOUNTER — Ambulatory Visit: Payer: BC Managed Care – PPO | Admitting: Internal Medicine

## 2022-10-20 ENCOUNTER — Other Ambulatory Visit (HOSPITAL_COMMUNITY): Payer: Self-pay

## 2022-10-23 ENCOUNTER — Ambulatory Visit: Payer: BC Managed Care – PPO | Admitting: Internal Medicine

## 2022-10-23 IMAGING — MR MR CARD MORPHOLOGY WO/W CM
45 of 48 series · 45 of 48 positions shown · IV contrast (gadavist)
Comparison: none

CLINICAL DATA: 24F with HFrEF (EF 15-20% on echo)

EXAM:
CARDIAC MRI
TECHNIQUE: The patient was scanned on a 1.5 Tesla Siemens magnet. A dedicated
cardiac coil was used. Functional imaging was done using Fiesta
sequences. [DATE], and 4 chamber views were done to assess for RWMA's.
Modified Ego rule using a short axis stack was used to
calculate an ejection fraction on a dedicated work station using
Circle software. The patient received 8 cc of Gadavist. After 10
minutes inversion recovery sequences were used to assess for
infiltration and scar tissue.
CONTRAST:  8 cc  of Gadavist

[Series 4: t2_haste_db_tra_bh · axial · 8.0mm · 1.41mm/px · 1 of 16 slices shown]
[im 1/16]
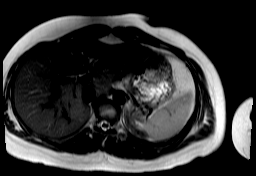

[Series 8: bSSFP · oblique · 8.0mm · 1.61mm/px · 1 of 25 slices shown (1 of 22)]
[im 1/25]
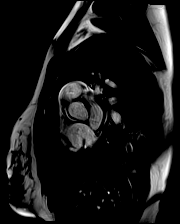

[Series 9: bSSFP · oblique · 8.0mm · 1.61mm/px · 1 of 25 slices shown (2 of 22)]
[im 1/25]
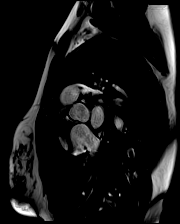

[Series 10: bSSFP · oblique · 8.0mm · 1.61mm/px · 1 of 25 slices shown (3 of 22)]
[im 1/25]
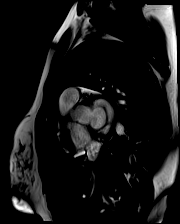

[Series 11: bSSFP · oblique · 8.0mm · 1.61mm/px · 1 of 25 slices shown (4 of 22)]
[im 1/25]
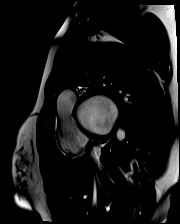

[Series 12: bSSFP · oblique · 8.0mm · 1.61mm/px · 1 of 25 slices shown (5 of 22)]
[im 1/25]
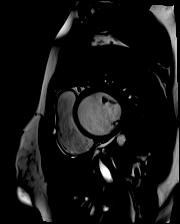

[Series 13: bSSFP · oblique · 8.0mm · 1.61mm/px · 1 of 25 slices shown (6 of 22)]
[im 1/25]
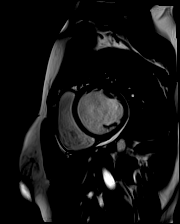

[Series 14: bSSFP · oblique · 8.0mm · 1.61mm/px · 1 of 25 slices shown (7 of 22)]
[im 1/25]
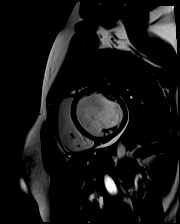

[Series 15: bSSFP · oblique · 8.0mm · 1.61mm/px · 1 of 25 slices shown (8 of 22)]
[im 1/25]
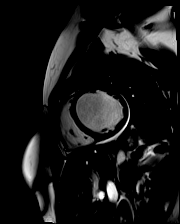

[Series 16: bSSFP · oblique · 8.0mm · 1.61mm/px · 1 of 25 slices shown (9 of 22)]
[im 1/25]
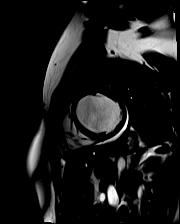

[Series 17: bSSFP · oblique · 8.0mm · 1.61mm/px · 1 of 25 slices shown (10 of 22)]
[im 1/25]
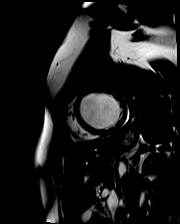

[Series 18: bSSFP · oblique · 8.0mm · 1.61mm/px · 1 of 25 slices shown (11 of 22)]
[im 1/25]
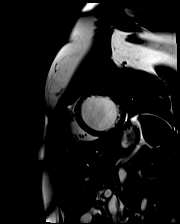

[Series 19: bSSFP · oblique · 8.0mm · 1.61mm/px · 1 of 25 slices shown (12 of 22)]
[im 1/25]
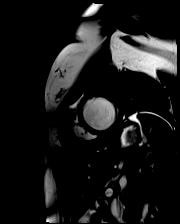

[Series 20: bSSFP · oblique · 8.0mm · 1.61mm/px · 1 of 25 slices shown (13 of 22)]
[im 1/25]
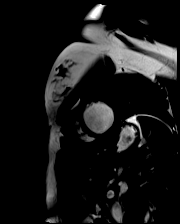

[Series 21: bSSFP · oblique · 8.0mm · 1.61mm/px · 1 of 25 slices shown (14 of 22)]
[im 1/25]
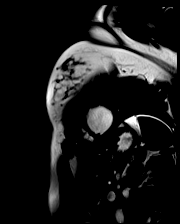

[Series 22: bSSFP · oblique · 8.0mm · 1.61mm/px · 1 of 25 slices shown (15 of 22)]
[im 1/25]
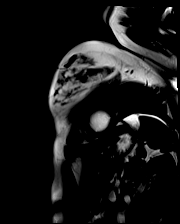

[Series 23: bSSFP · oblique · 8.0mm · 1.61mm/px · 1 of 25 slices shown (16 of 22)]
[im 1/25]
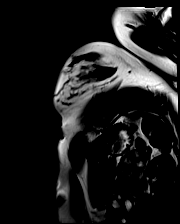

[Series 24: bSSFP · oblique · 8.0mm · 1.61mm/px · 1 of 25 slices shown (17 of 22)]
[im 1/25]
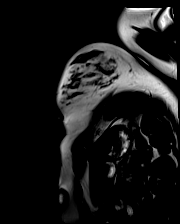

[Series 25: bSSFP · oblique · 8.0mm · 1.61mm/px · 1 of 25 slices shown (18 of 22)]
[im 1/25]
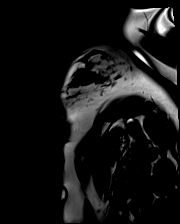

[Series 26: bSSFP · coronal · 6.0mm · 1.41mm/px · 1 of 25 slices shown (19 of 22)]
[im 1/25]
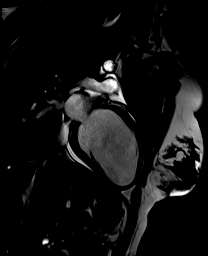

[Series 27: bSSFP · sagittal · 6.0mm · 1.41mm/px · 1 of 25 slices shown (20 of 22)]
[im 1/25]
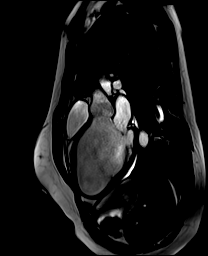

[Series 28: bSSFP · oblique · 6.0mm · 1.41mm/px · 1 of 25 slices shown (21 of 22)]
[im 1/25]
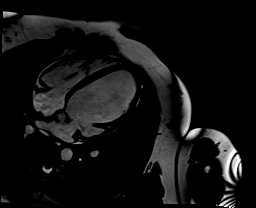

[Series 29: (id)_long_t1 · oblique · 8.0mm · 1.56mm/px · 1 of 24 slices shown]
[im 1/24]
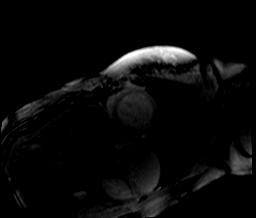

[Series 30: (id)_long_t1_moco · oblique · 8.0mm · 1.56mm/px · 1 of 24 slices shown]
[im 1/24]
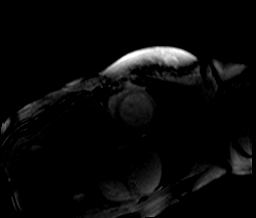

[Series 31: (id)_long_t1_moco_t1 · oblique · 8.0mm · 1.56mm/px · 1 of 6 slices shown]
[im 1/6]
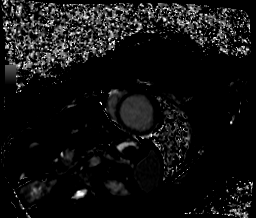

[Series 33: (id)_trufi · oblique · 8.0mm · 2.08mm/px · 1 of 9 slices shown]
[im 1/9]
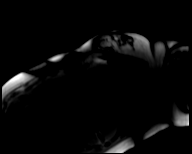

[Series 34: (id)_trufi_moco · oblique · 8.0mm · 2.08mm/px · 1 of 9 slices shown]
[im 1/9]
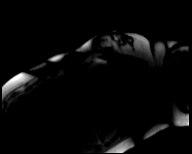

[Series 35: (id)_trufi_moco_t2 · oblique · 8.0mm · 2.08mm/px · 1 of 3 slices shown]
[im 1/3]
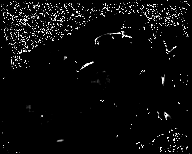

[Series 37: cine rvot · sagittal · 6.0mm · 1.41mm/px · 1 of 25 slices shown]
[im 1/25]
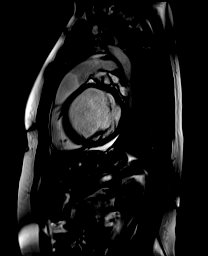

[Series 38: bSSFP · coronal · 6.0mm · 1.41mm/px · 1 of 25 slices shown (22 of 22)]
[im 1/25]
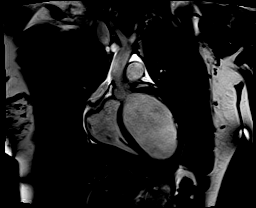

[Series 39: cor rvot · oblique · 6.0mm · 1.41mm/px · 1 of 25 slices shown]
[im 1/25]
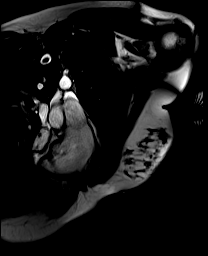

[Series 40: aortic valve cine · axial · 6.0mm · 1.41mm/px · 1 of 25 slices shown]
[im 1/25]
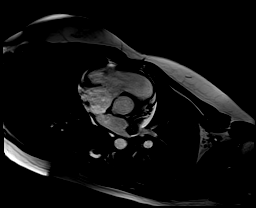

[Series 41: pre short axis · oblique · non-contrast · 8.0mm · 2.25mm/px · 1 of 10 slices shown (1 of 6)]
[im 1/10]
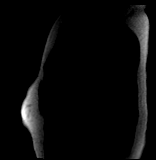

[Series 42: pre short axis · oblique · non-contrast · 8.0mm · 2.25mm/px · 1 of 10 slices shown (2 of 6)]
[im 1/10]
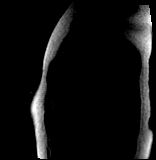

[Series 43: pre short axis · oblique · non-contrast · 8.0mm · 2.25mm/px · 1 of 10 slices shown (3 of 6)]
[im 1/10]
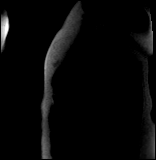

[Series 44: pre short axis · oblique · non-contrast · 8.0mm · 2.25mm/px · 1 of 10 slices shown (4 of 6)]
[im 1/10]
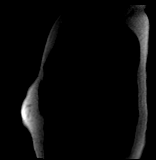

[Series 45: pre short axis · oblique · non-contrast · 8.0mm · 2.25mm/px · 1 of 10 slices shown (5 of 6)]
[im 1/10]
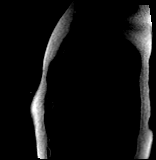

[Series 46: pre short axis · oblique · non-contrast · 8.0mm · 2.25mm/px · 1 of 10 slices shown (6 of 6)]
[im 1/10]
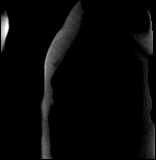

[Series 47: aortic valve flow_200_tp_retro_bh · axial · 6.0mm · 1.73mm/px · 1 of 30 slices shown (1 of 2)]
[im 1/30]
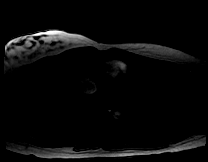

[Series 48: aortic valve flow_200_tp_retro_bh_mag · axial · 6.0mm · 1.73mm/px · 1 of 30 slices shown]
[im 1/30]
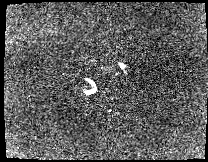

[Series 49: aortic valve flow_200_tp_retro_bh_p · axial · 6.0mm · 1.73mm/px · 1 of 30 slices shown]
[im 1/30]
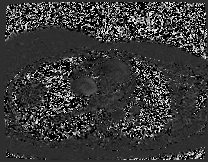

[Series 50: pulmonary valve flow_150_tp_retro_bh · oblique · 6.0mm · 1.73mm/px · 1 of 30 slices shown]
[im 1/30]
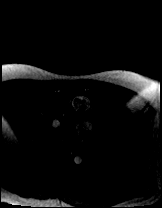

[Series 51: pulmonary valve flow_150_tp_retro_bh_mag · oblique · 6.0mm · 1.73mm/px · 1 of 30 slices shown]
[im 1/30]
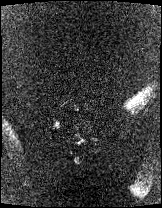

[Series 52: pulmonary valve flow_150_tp_retro_bh_p · oblique · 6.0mm · 1.73mm/px · 1 of 30 slices shown]
[im 1/30]
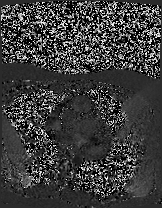

[Series 53: aortic valve flow_200_tp_retro_bh · axial · 6.0mm · 1.73mm/px · 1 of 30 slices shown (2 of 2)]
[im 1/30]
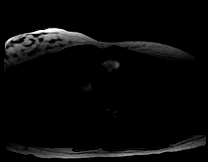

[45 of 48 positions shown; findings below may reference images not displayed]

FINDINGS: Left ventricle:

-Severe dilatation

-Moderate systolic dysfunction

-Nonspecific elevation in  ECV (30%).  Normal T2 (52ms)

-No LGE

LV EF:  31% (Normal 56-78%)

Absolute volumes:

LV EDV: 327mL (Normal 52-141 mL)

LV ESV: 227mL (Normal 13-51 mL)

LV SV: 100mL (Normal 33-97 mL)

CO: 6.4L/min (Normal 2.7-6.0 L/min)

Indexed volumes:

LV EDV: 167mL/sq-m (Normal 41-81 mL/sq-m)

LV ESV: 116mL/sq-m (Normal 12-21 mL/sq-m)

LV SV: 51mL/sq-m (Normal 26-56 mL/sq-m)

CI: 3.2L/min/sq-m (Normal 1.8-3.8 L/min/sq-m)

Right ventricle: Mild dilatation with normal systolic function

RV EF: 55% (Normal 47-80%)

Absolute volumes:

RV EDV: 188mL (Normal 58-154 mL)

RV ESV: 85mL (Normal 12-68 mL)

RV SV: 103mL (Normal 35-98 mL)

CO: 6.5L/min (Normal 2.7-6 L/min)

Indexed volumes:

RV EDV: 96mL/sq-m (Normal 48-87 mL/sq-m)

RV ESV: 44mL/sq-m (Normal 11-28 mL/sq-m)

RV SV: 52mL/sq-m (Normal 27-57 mL/sq-m)

CI: 3.3L/min/sq-m (Normal 1.8-3.8 L/min/sq-m)

Left atrium: Mild dilatation

Right atrium: Normal size

Mitral valve: Trivial regurgitation

Aortic valve: Tricuspid.  No regurgitation

Tricuspid valve: No regurgitation

Pulmonic valve: No regurgitation

Pericardium: Small effusion measuring up to 8mm adjacent to LV
inferior wall
IMPRESSION: 1. Severe LV dilatation (EDVI 167 mL/m^2) with moderate systolic
dysfunction (EF 31%)

2.  Mild RV dilatation with normal systolic function (EF 55%)

3.  No late gadolinium enhancement

4.  Small pericardial effusion

## 2022-10-31 HISTORY — PX: HEART TRANSPLANT: SHX268

## 2023-01-08 ENCOUNTER — Telehealth (HOSPITAL_COMMUNITY): Payer: Self-pay

## 2023-01-08 NOTE — Telephone Encounter (Signed)
Pt insurance is active and benefits verified through [place]. Co-pay $0.00, DED $3,200.00/$3,200.00 met, out of pocket $4,500.00/$4,500.00 met, co-insurance 20%. No pre-authorization required. Sol/[employer], 01/08/23 @ 12:23PM, [id number]   How many CR sessions are covered? (36 sessions for TCR, 72 sessions for ICR)72 Is this a lifetime maximum or an annual maximum? Lifetime Has the member used any of these services to date? No Is there a time limit (weeks/months) on start of program and/or program completion? No     Will contact patient to see if she is interested in the Cardiac Rehab Program.

## 2023-01-08 NOTE — Telephone Encounter (Signed)
Called patient to see if she was interested in participating in the Cardiac Rehab Program. Patient stated yes. Patient will come in for orientation on 01/09/23 @ 10:30AM and will attend the 8:15AM exercise class. Went over insurance, patient verbalized understanding.

## 2023-01-08 NOTE — Telephone Encounter (Signed)
Called pt to confirm appointment for Cardiac Rehab Orientation tomorrow. Completed her CR Nursing Assessment, reviewed instructions and directions for appointment tomorrow, pt understands without assistance.

## 2023-01-09 ENCOUNTER — Encounter (HOSPITAL_COMMUNITY)
Admission: RE | Admit: 2023-01-09 | Discharge: 2023-01-09 | Disposition: A | Discharge status: Home / Self Care | Payer: BC Managed Care – PPO | Source: Ambulatory Visit | Attending: Cardiology | Admitting: Cardiology

## 2023-01-09 ENCOUNTER — Encounter (HOSPITAL_COMMUNITY): Payer: Self-pay

## 2023-01-09 VITALS — BP 130/90 | HR 90 | Ht 67.75 in | Wt 240.3 lb

## 2023-01-09 DIAGNOSIS — Z941 Heart transplant status: Secondary | ICD-10-CM

## 2023-01-09 DIAGNOSIS — Z48812 Encounter for surgical aftercare following surgery on the circulatory system: Secondary | ICD-10-CM | POA: Insufficient documentation

## 2023-01-09 NOTE — Progress Notes (Signed)
Cardiac Individual Treatment Plan  Patient Details  Name: [name] MRN: [id number] Date of Birth: Sep 15, 1996 Referring Provider:   Flowsheet Row INTENSIVE CARDIAC REHAB ORIENT from 01/09/2023 in [facility]  Referring Provider Dr. Mosetta Pigeon (Fransico Him, MD covering)       Initial Encounter Date:  [place] from 01/09/2023 in [facility]  Date 01/09/23       Visit Diagnosis: 10/31/22 Heart transplant at [facility] Medications on Admission:  Current Outpatient Medications:    acetaminophen (TYLENOL) 325 MG tablet, Take 650 mg by mouth every 6 (six) hours as needed., Disp: , Rfl:    buPROPion (WELLBUTRIN SR) 150 MG 12 hr tablet, Take 150 mg by mouth daily., Disp: , Rfl:    Calcium Carb-Cholecalciferol 600-10 MG-MCG TABS, Take 600 mcg by mouth in the morning and at bedtime., Disp: , Rfl:    diazepam (VALIUM) 5 MG tablet, Take 5 mg by mouth as needed. Prior to biopsy, Disp: , Rfl:    ELIQUIS 5 MG TABS tablet, Take 5 mg by mouth 2 (two) times daily., Disp: , Rfl:    furosemide (LASIX) 40 MG tablet, Take 1 tablet (40 mg total) by mouth daily., Disp: 30 tablet, Rfl: 6   gabapentin (NEURONTIN) 400 MG capsule, Take 400 mg by mouth 3 (three) times daily., Disp: , Rfl:    hydrOXYzine (VISTARIL) 25 MG capsule, Take 25 mg by mouth 2 (two) times daily as needed for anxiety., Disp: , Rfl:    methocarbamol (ROBAXIN) 500 MG tablet, Take 500 mg by mouth 3 (three) times daily as needed for muscle spasms., Disp: , Rfl:    nystatin (MYCOSTATIN) 100000 UNIT/ML suspension, Take 5 mLs by mouth 4 (four) times daily., Disp: , Rfl:    predniSONE (DELTASONE) 5 MG tablet, Take 12.5 mg by mouth daily with breakfast. Take as directed, Disp: , Rfl:    sertraline (ZOLOFT) 100 MG tablet, Take 100 mg by mouth daily., Disp: , Rfl:    ASPIRIN ADULT LOW  DOSE 81 MG tablet, Take 81 mg by mouth daily., Disp: , Rfl:    fluconazole (DIFLUCAN) 150 MG tablet, Take 1 tablet (150 mg total) by mouth as needed. (Patient not taking: Reported on 01/09/2023), Disp: 30 tablet, Rfl: 0   ketoconazole (NIZORAL) 2 % cream, Apply 1 Application topically daily as needed for irritation., Disp: , Rfl:    ketoconazole (NIZORAL) 2 % shampoo, Apply 1 Application topically 2 (two) times a week. As needed, Disp: , Rfl:    mycophenolate (CELLCEPT) 250 MG capsule, Take 1,000 mg by mouth 2 (two) times daily., Disp: , Rfl:    pantoprazole (PROTONIX) 40 MG tablet, Take 40 mg by mouth daily., Disp: , Rfl:    pravastatin (PRAVACHOL) 40 MG tablet, Take 40 mg by mouth daily., Disp: , Rfl:    sulfamethoxazole-trimethoprim (BACTRIM) 400-80 MG tablet, Take 1 tablet by mouth daily., Disp: , Rfl:    tacrolimus (PROGRAF) 1 MG capsule, Take 1 mg by mouth 2 (two) times daily. 5 mg at 0900 4 mg at 2100, Disp: , Rfl:    valACYclovir (VALTREX) 1000 MG tablet, Take 1,000 mg by mouth as needed for rash. (Patient not taking: Reported on 01/09/2023), Disp: , Rfl:    valGANciclovir (VALCYTE) 450 MG tablet, Take 900 mg by mouth daily., Disp: , Rfl:   Past Medical History: Past Medical  History:  Diagnosis Date   Lactose intolerance    NICM (nonischemic cardiomyopathy) (HCC)    PVC's (premature ventricular contractions)    Torsades de pointes (HCC)     Tobacco Use: Social History   Tobacco Use  Smoking Status Never  Smokeless Tobacco Never    Labs: Review Flowsheet       Latest Ref Rng & Units 09/01/2022  Labs for ITP Cardiac and Pulmonary Rehab  Bicarbonate 20.0 - 28.0 mmol/L 21.9  19.0  21.9   TCO2 22 - 32 mmol/L '23  20  23   '$ Acid-base deficit 0.0 - 2.0 mmol/L 3.0  5.0  3.0   O2 Saturation % 68  66  66     Capillary Blood Glucose: No results found for: "GLUCAP"   Exercise Target Goals: Exercise Program Goal: Individual exercise prescription set using results from initial 6  min walk test and THRR while considering  patient's activity barriers and safety.   Exercise Prescription Goal: Initial exercise prescription builds to 30-45 minutes a day of aerobic activity, 2-3 days per week.  Home exercise guidelines will be given to patient during program as part of exercise prescription that the participant will acknowledge.  Activity Barriers & Risk Stratification:  Activity Barriers & Cardiac Risk Stratification - 01/09/23 1429       Activity Barriers & Cardiac Risk Stratification   Activity Barriers Muscular Weakness;Deconditioning;Back Problems    Cardiac Risk Stratification High             6 Minute Walk:  6 Minute Walk     Row Name 01/09/23 1209         6 Minute Walk   Phase Initial     Distance 1650 feet     Walk Time 6 minutes     # of Rest Breaks 0     MPH 3.13     METS 5.81     RPE 12     Perceived Dyspnea  0     VO2 Peak 20.3     Symptoms Yes (comment)     Comments Bilateral calf tightness     Resting HR 88 bpm     Resting BP 130/90     Resting Oxygen Saturation  98 %     Exercise Oxygen Saturation  during 6 min walk 96 %     Max Ex. HR 111 bpm     Max Ex. BP 164/88     2 Minute Post BP 126/80              Oxygen Initial Assessment:   Oxygen Re-Evaluation:   Oxygen Discharge (Final Oxygen Re-Evaluation):   Initial Exercise Prescription:  Initial Exercise Prescription - 01/09/23 1400       Date of Initial Exercise RX and Referring Provider   Date 01/09/23    Referring Provider Dr. Mosetta Pigeon Fransico Him, MD covering)    Expected Discharge Date 03/23/23      Arm Ergometer   Level 1.5    Watts 25    RPM 60    Minutes 15    METs 5.8      Recumbant Elliptical   Level 2    RPM 60    Watts 90    Minutes 15    METs 5.8      Prescription Details   Frequency (times per week) 3    Duration Progress to 30 minutes of continuous aerobic without signs/symptoms of physical distress      Intensity  THRR 40-80% of  Max Heartrate 78-156    Ratings of Perceived Exertion 11-13    Perceived Dyspnea 0-4      Progression   Progression Continue progressive overload as per policy without signs/symptoms or physical distress.      Resistance Training   Training Prescription Yes    Weight 3 lbs    Reps 10-15             Perform Capillary Blood Glucose checks as needed.  Exercise Prescription Changes:   Exercise Comments:   Exercise Goals and Review:   Exercise Goals     Row Name 01/09/23 1430             Exercise Goals   Increase Physical Activity Yes       Intervention Provide advice, education, support and counseling about physical activity/exercise needs.;Develop an individualized exercise prescription for aerobic and resistive training based on initial evaluation findings, risk stratification, comorbidities and participant's personal goals.       Expected Outcomes Short Term: Attend rehab on a regular basis to increase amount of physical activity.;Long Term: Add in home exercise to make exercise part of routine and to increase amount of physical activity.;Long Term: Exercising regularly at least 3-5 days a week.       Increase Strength and Stamina Yes       Intervention Provide advice, education, support and counseling about physical activity/exercise needs.;Develop an individualized exercise prescription for aerobic and resistive training based on initial evaluation findings, risk stratification, comorbidities and participant's personal goals.       Expected Outcomes Short Term: Increase workloads from initial exercise prescription for resistance, speed, and METs.;Short Term: Perform resistance training exercises routinely during rehab and add in resistance training at home;Long Term: Improve cardiorespiratory fitness, muscular endurance and strength as measured by increased METs and functional capacity (6MWT)       Able to understand and use rate of perceived exertion (RPE) scale Yes        Intervention Provide education and explanation on how to use RPE scale       Expected Outcomes Short Term: Able to use RPE daily in rehab to express subjective intensity level;Long Term:  Able to use RPE to guide intensity level when exercising independently       Knowledge and understanding of Target Heart Rate Range (THRR) Yes       Intervention Provide education and explanation of THRR including how the numbers were predicted and where they are located for reference       Expected Outcomes Short Term: Able to state/look up THRR;Long Term: Able to use THRR to govern intensity when exercising independently;Short Term: Able to use daily as guideline for intensity in rehab       Understanding of Exercise Prescription Yes       Intervention Provide education, explanation, and written materials on patient's individual exercise prescription       Expected Outcomes Short Term: Able to explain program exercise prescription;Long Term: Able to explain home exercise prescription to exercise independently                Exercise Goals Re-Evaluation :   Discharge Exercise Prescription (Final Exercise Prescription Changes):   Nutrition:  Target Goals: Understanding of nutrition guidelines, daily intake of sodium '1500mg'$ , cholesterol '200mg'$ , calories 30% from fat and 7% or less from saturated fats, daily to have 5 or more servings of fruits and vegetables.  Biometrics:  Pre Biometrics - 01/09/23 1042  Pre Biometrics   Waist Circumference 42.5 inches    Hip Circumference 49 inches    Waist to Hip Ratio 0.87 %    Triceps Skinfold 47 mm    % Body Fat 46.4 %    Grip Strength 33 kg    Flexibility 18 in    Single Leg Stand 30 seconds              Nutrition Therapy Plan and Nutrition Goals:   Nutrition Assessments:  MEDIFICTS Score Key: ?70 Need to make dietary changes  40-70 Heart Healthy Diet ? 40 Therapeutic Level Cholesterol Diet   Flowsheet Row Cardiac Rehab from  06/22/2021 in [facility] Cardiac and Pulmonary Rehab  Picture Your Plate Total Score on Admission 78  Picture Your Plate Total Score on Discharge 73      Picture Your Plate Scores: [id number] Unhealthy dietary pattern with much room for improvement. 41-50 Dietary pattern unlikely to meet recommendations for good health and room for improvement. 51-60 More healthful dietary pattern, with some room for improvement.  >60 Healthy dietary pattern, although there may be some specific behaviors that could be improved.    Nutrition Goals Re-Evaluation:   Nutrition Goals Re-Evaluation:   Nutrition Goals Discharge (Final Nutrition Goals Re-Evaluation):   Psychosocial: Target Goals: Acknowledge presence or absence of significant depression and/or stress, maximize coping skills, provide positive support system. Participant is able to verbalize types and ability to use techniques and skills needed for reducing stress and depression.  Initial Review & Psychosocial Screening:  Initial Psych Review & Screening - 01/09/23 1448       Initial Review   Current issues with --   Pt is taking wellbutrin and Zoloft. The pt has hx of generalized anxiety. No voiced issues with depression or stress. PHQ -9 =1.     Family Dynamics   Good Support System? Yes    Comments Has mother for support      Barriers   Psychosocial barriers to participate in program There are no identifiable barriers or psychosocial needs.      Screening Interventions   Interventions Encouraged to exercise             Quality of Life Scores:  Quality of Life - 01/09/23 1437       Quality of Life   Select Quality of Life      Quality of Life Scores   Health/Function Pre 24.75 %    Socioeconomic Pre 21.36 %    Socioeconomic Post --    Socioeconomic % Change  --    Psych/Spiritual Pre 24 %    Family Pre 27.88 %    GLOBAL Pre 24.23 %            Scores of 19 and below usually indicate a poorer quality of life in these  areas.  A difference of  2-3 points is a clinically meaningful difference.  A difference of 2-3 points in the total score of the Quality of Life Index has been associated with significant improvement in overall quality of life, self-image, physical symptoms, and general health in studies assessing change in quality of life.  PHQ-9: Review Flowsheet       01/09/2023 06/22/2021 03/30/2021  Depression screen PHQ 2/9  Decreased Interest 0 0 1  Down, Depressed, Hopeless 0 1 1  PHQ - 2 Score 0 1 2  Altered sleeping 0 0 0  Tired, decreased energy 0 0 1  Change in appetite 1 0 1  Feeling  bad or failure about yourself  0 0 0  Trouble concentrating 0 0 0  Moving slowly or fidgety/restless 0 0 0  Suicidal thoughts 0 0 0  PHQ-9 Score '1 1 4  '$ Difficult doing work/chores Not difficult at all Not difficult at all Somewhat difficult   Interpretation of Total Score  Total Score Depression Severity:  1-4 = Minimal depression, 5-9 = Mild depression, 10-14 = Moderate depression, 15-19 = Moderately severe depression, 20-27 = Severe depression   Psychosocial Evaluation and Intervention:   Psychosocial Re-Evaluation:   Psychosocial Discharge (Final Psychosocial Re-Evaluation):   Vocational Rehabilitation: Provide vocational rehab assistance to qualifying candidates.   Vocational Rehab Evaluation & Intervention:  Vocational Rehab - 01/09/23 1440       Initial Vocational Rehab Evaluation & Intervention   Assessment shows need for Vocational Rehabilitation No      Vocational Rehab Re-Evaulation   Comments Pt is a registered dietican. Pt was not at her job long enough to qualify for FMLA benefits. 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             Education: Education Goals: Education classes will be provided on a weekly basis, covering required topics. Participant will state understanding/return demonstration of topics presented.     Core Videos: Exercise    Move It!   Clinical staff conducted group or individual video education with verbal and written material and guidebook.  Patient learns the recommended Pritikin exercise program. Exercise with the goal of living a long, healthy life. Some of the health benefits of exercise include controlled diabetes, healthier blood pressure levels, improved cholesterol levels, improved heart and lung capacity, improved sleep, and better body composition. Everyone should speak with their doctor before starting or changing an exercise routine.  Biomechanical Limitations Clinical staff conducted group or individual video education with verbal and written material and guidebook.  Patient learns how biomechanical limitations can impact exercise and how we can mitigate and possibly overcome limitations to have an impactful and balanced exercise routine.  Body Composition Clinical staff conducted group or individual video education with verbal and written material and guidebook.  Patient learns that body composition (ratio of muscle mass to fat mass) is a key component to assessing overall fitness, rather than body weight alone. Increased fat mass, especially visceral belly fat, can put [place] at increased risk for metabolic syndrome, type 2 diabetes, heart disease, and even death. It is recommended to combine diet and exercise (cardiovascular and resistance training) to improve your body composition. Seek guidance from your physician and exercise physiologist before implementing an exercise routine.  Exercise Action Plan Clinical staff conducted group or individual video education with verbal and written material and guidebook.  Patient learns the recommended strategies to achieve and enjoy long-term exercise adherence, including variety, self-motivation, self-efficacy, and positive decision making. Benefits of exercise include fitness, good health, weight management, more energy, better sleep, less stress, and overall  well-being.  Medical   Heart Disease Risk Reduction Clinical staff conducted group or individual video education with verbal and written material and guidebook.  Patient learns our heart is our most vital organ as it circulates oxygen, nutrients, white blood cells, and hormones throughout the entire body, and carries waste away. Data supports a plant-based eating plan like the Pritikin Program for its effectiveness in slowing progression of and reversing heart disease. The video provides a number of recommendations to address heart disease.   Metabolic Syndrome and Belly Fat  Clinical staff  conducted group or individual video education with verbal and written material and guidebook.  Patient learns what metabolic syndrome is, how it leads to heart disease, and how one can reverse it and keep it from coming back. You have metabolic syndrome if you have 3 of the following 5 criteria: abdominal obesity, high blood pressure, high triglycerides, low HDL cholesterol, and high blood sugar.  Hypertension and Heart Disease Clinical staff conducted group or individual video education with verbal and written material and guidebook.  Patient learns that high blood pressure, or hypertension, is very common in the [place]. Hypertension is largely due to excessive salt intake, but other important risk factors include being overweight, physical inactivity, drinking too much alcohol, smoking, and not eating enough potassium from fruits and vegetables. High blood pressure is a leading risk factor for heart attack, stroke, congestive heart failure, dementia, kidney failure, and premature death. Long-term effects of excessive salt intake include stiffening of the arteries and thickening of heart muscle and organ damage. Recommendations include ways to reduce hypertension and the risk of heart disease.  Diseases of Our Time - Focusing on Diabetes Clinical staff conducted group or individual video education with  verbal and written material and guidebook.  Patient learns why the best way to stop diseases of our time is prevention, through food and other lifestyle changes. Medicine (such as prescription pills and surgeries) is often only a Band-Aid on the problem, not a long-term solution. Most common diseases of our time include obesity, type 2 diabetes, hypertension, heart disease, and cancer. The Pritikin Program is recommended and has been proven to help reduce, reverse, and/or prevent the damaging effects of metabolic syndrome.  Nutrition   Overview of the Pritikin Eating Plan  Clinical staff conducted group or individual video education with verbal and written material and guidebook.  Patient learns about the [place] for disease risk reduction. The [place] emphasizes a wide variety of unrefined, minimally-processed carbohydrates, like fruits, vegetables, whole grains, and legumes. Go, Caution, and Stop food choices are explained. Plant-based and lean animal proteins are emphasized. Rationale provided for low sodium intake for blood pressure control, low added sugars for blood sugar stabilization, and low added fats and oils for coronary artery disease risk reduction and weight management.  Calorie Density  Clinical staff conducted group or individual video education with verbal and written material and guidebook.  Patient learns about calorie density and how it impacts the Pritikin Eating Plan. Knowing the characteristics of the food you choose will help you decide whether those foods will lead to weight gain or weight loss, and whether you want to consume more or less of them. Weight loss is usually a side effect of the Pritikin Eating Plan because of its focus on low calorie-dense foods.  Label Reading  Clinical staff conducted group or individual video education with verbal and written material and guidebook.  Patient learns about the Pritikin recommended label reading  guidelines and corresponding recommendations regarding calorie density, added sugars, sodium content, and whole grains.  Dining Out - Part 1  Clinical staff conducted group or individual video education with verbal and written material and guidebook.  Patient learns that restaurant meals can be sabotaging because they can be so high in calories, fat, sodium, and/or sugar. Patient learns recommended strategies on how to positively address this and avoid unhealthy pitfalls.  Facts on Fats  Clinical staff conducted group or individual video education with verbal and written material and guidebook.  Patient  learns that lifestyle modifications can be just as effective, if not more so, as many medications for lowering your risk of heart disease. A Pritikin lifestyle can help to reduce your risk of inflammation and atherosclerosis (cholesterol build-up, or plaque, in the artery walls). Lifestyle interventions such as dietary choices and physical activity address the cause of atherosclerosis. A review of the types of fats and their impact on blood cholesterol levels, along with dietary recommendations to reduce fat intake is also included.  Nutrition Action Plan  Clinical staff conducted group or individual video education with verbal and written material and guidebook.  Patient learns how to incorporate Pritikin recommendations into their lifestyle. Recommendations include planning and keeping personal health goals in mind as an important part of their success.  Healthy Mind-Set    Healthy Minds, Bodies, Hearts  Clinical staff conducted group or individual video education with verbal and written material and guidebook.  Patient learns how to identify when they are stressed. Video will discuss the impact of that stress, as well as the many benefits of stress management. Patient will also be introduced to stress management techniques. The way we think, act, and feel has an impact on our hearts.  How Our  Thoughts Can Heal Our Hearts  Clinical staff conducted group or individual video education with verbal and written material and guidebook.  Patient learns that negative thoughts can cause depression and anxiety. This can result in negative lifestyle behavior and serious health problems. Cognitive behavioral therapy is an effective method to help control our thoughts in order to change and improve our emotional outlook.  Additional Videos:  Exercise    Improving Performance  Clinical staff conducted group or individual video education with verbal and written material and guidebook.  Patient learns to use a non-linear approach by alternating intensity levels and lengths of time spent exercising to help burn more calories and lose more body fat. Cardiovascular exercise helps improve heart health, metabolism, hormonal balance, blood sugar control, and recovery from fatigue. Resistance training improves strength, endurance, balance, coordination, reaction time, metabolism, and muscle mass. Flexibility exercise improves circulation, posture, and balance. Seek guidance from your physician and exercise physiologist before implementing an exercise routine and learn your capabilities and proper form for all exercise.  Introduction to Yoga  Clinical staff conducted group or individual video education with verbal and written material and guidebook.  Patient learns about yoga, a discipline of the coming together of mind, breath, and body. The benefits of yoga include improved flexibility, improved range of motion, better posture and core strength, increased lung function, weight loss, and positive self-image. Yoga's heart health benefits include lowered blood pressure, healthier heart rate, decreased cholesterol and triglyceride levels, improved immune function, and reduced stress. Seek guidance from your physician and exercise physiologist before implementing an exercise routine and learn your capabilities and  proper form for all exercise.  Medical   Aging: Enhancing Your Quality of Life  Clinical staff conducted group or individual video education with verbal and written material and guidebook.  Patient learns key strategies and recommendations to stay in good physical health and enhance quality of life, such as prevention strategies, having an advocate, securing a [place], and keeping a list of medications and system for tracking them. It also discusses how to avoid risk for bone loss.  Biology of Weight Control  Clinical staff conducted group or individual video education with verbal and written material and guidebook.  Patient learns that weight  gain occurs because we consume more calories than we burn (eating more, moving less). Even if your body weight is normal, you may have higher ratios of fat compared to muscle mass. Too much body fat puts you at increased risk for cardiovascular disease, heart attack, stroke, type 2 diabetes, and obesity-related cancers. In addition to exercise, following the [place] can help reduce your risk.  Decoding Lab Results  Clinical staff conducted group or individual video education with verbal and written material and guidebook.  Patient learns that lab test reflects one measurement whose values change over time and are influenced by many factors, including medication, stress, sleep, exercise, food, hydration, pre-existing medical conditions, and more. It is recommended to use the knowledge from this video to become more involved with your lab results and evaluate your numbers to speak with your doctor.   Diseases of Our Time - Overview  Clinical staff conducted group or individual video education with verbal and written material and guidebook.  Patient learns that according to the CDC, 50% to 70% of chronic diseases (such as obesity, type 2 diabetes, elevated lipids, hypertension, and heart disease) are avoidable through  lifestyle improvements including healthier food choices, listening to satiety cues, and increased physical activity.  Sleep Disorders Clinical staff conducted group or individual video education with verbal and written material and guidebook.  Patient learns how good quality and duration of sleep are important to overall health and well-being. Patient also learns about sleep disorders and how they impact health along with recommendations to address them, including discussing with a physician.  Nutrition  Dining Out - Part 2 Clinical staff conducted group or individual video education with verbal and written material and guidebook.  Patient learns how to plan ahead and communicate in order to maximize their dining experience in a healthy and nutritious manner. Included are recommended food choices based on the type of restaurant the patient is visiting.   Fueling a Best boy conducted group or individual video education with verbal and written material and guidebook.  There is a strong connection between our food choices and our health. Diseases like obesity and type 2 diabetes are very prevalent and are in large-part due to lifestyle choices. The Pritikin Eating Plan provides plenty of food and hunger-curbing satisfaction. It is easy to follow, affordable, and helps reduce health risks.  Menu Workshop  Clinical staff conducted group or individual video education with verbal and written material and guidebook.  Patient learns that restaurant meals can sabotage health goals because they are often packed with calories, fat, sodium, and sugar. Recommendations include strategies to plan ahead and to communicate with the manager, chef, or server to help order a healthier meal.  Planning Your Eating Strategy  Clinical staff conducted group or individual video education with verbal and written material and guidebook.  Patient learns about the [place] and its benefit of  reducing the risk of disease. The [place] does not focus on calories. Instead, it emphasizes high-quality, nutrient-rich foods. By knowing the characteristics of the foods, we choose, we can determine their calorie density and make informed decisions.  Targeting Your Nutrition Priorities  Clinical staff conducted group or individual video education with verbal and written material and guidebook.  Patient learns that lifestyle habits have a tremendous impact on disease risk and progression. This video provides eating and physical activity recommendations based on your personal health goals, such as reducing LDL cholesterol, losing weight, preventing or controlling  type 2 diabetes, and reducing high blood pressure.  Vitamins and Minerals  Clinical staff conducted group or individual video education with verbal and written material and guidebook.  Patient learns different ways to obtain key vitamins and minerals, including through a recommended healthy diet. It is important to discuss all supplements you take with your doctor.   Healthy Mind-Set    Smoking Cessation  Clinical staff conducted group or individual video education with verbal and written material and guidebook.  Patient learns that cigarette smoking and tobacco addiction pose a serious health risk which affects millions of people. Stopping smoking will significantly reduce the risk of heart disease, lung disease, and many forms of cancer. Recommended strategies for quitting are covered, including working with your doctor to develop a successful plan.  Culinary   Becoming a Financial trader conducted group or individual video education with verbal and written material and guidebook.  Patient learns that cooking at home can be healthy, cost-effective, quick, and puts them in control. Keys to cooking healthy recipes will include looking at your recipe, assessing your equipment needs, planning ahead, making it  simple, choosing cost-effective seasonal ingredients, and limiting the use of added fats, salts, and sugars.  Cooking - Breakfast and Snacks  Clinical staff conducted group or individual video education with verbal and written material and guidebook.  Patient learns how important breakfast is to satiety and nutrition through the entire day. Recommendations include key foods to eat during breakfast to help stabilize blood sugar levels and to prevent overeating at meals later in the day. Planning ahead is also a key component.  Cooking - Human resources officer conducted group or individual video education with verbal and written material and guidebook.  Patient learns eating strategies to improve overall health, including an approach to cook more at home. Recommendations include thinking of animal protein as a side on your plate rather than center stage and focusing instead on lower calorie dense options like vegetables, fruits, whole grains, and plant-based proteins, such as beans. Making sauces in large quantities to freeze for later and leaving the skin on your vegetables are also recommended to maximize your experience.  Cooking - Healthy Salads and Dressing Clinical staff conducted group or individual video education with verbal and written material and guidebook.  Patient learns that vegetables, fruits, whole grains, and legumes are the foundations of the [place]. Recommendations include how to incorporate each of these in flavorful and healthy salads, and how to create homemade salad dressings. Proper handling of ingredients is also covered. Cooking - Soups and [employer] - Soups and Desserts Clinical staff conducted group or individual video education with verbal and written material and guidebook.  Patient learns that Pritikin soups and desserts make for easy, nutritious, and delicious snacks and meal components that are low in sodium, fat, sugar, and calorie  density, while high in vitamins, minerals, and filling fiber. Recommendations include simple and healthy ideas for soups and desserts.   Overview     The Pritikin Solution Program Overview Clinical staff conducted group or individual video education with verbal and written material and guidebook.  Patient learns that the results of the [place] Program have been documented in more than 100 articles published in peer-reviewed journals, and the benefits include reducing risk factors for (and, in some cases, even reversing) high cholesterol, high blood pressure, type 2 diabetes, obesity, and more! An overview of the three key pillars of the Pritikin Program  will be covered: eating well, doing regular exercise, and having a healthy mind-set.  WORKSHOPS  Exercise: Exercise Basics: Building Your Action Plan Clinical staff led group instruction and group discussion with PowerPoint presentation and patient guidebook. To enhance the learning environment the use of posters, models and videos may be added. At the conclusion of this workshop, patients will comprehend the difference between physical activity and exercise, as well as the benefits of incorporating both, into their routine. Patients will understand the FITT (Frequency, Intensity, Time, and Type) principle and how to use it to build an exercise action plan. In addition, safety concerns and other considerations for exercise and cardiac rehab will be addressed by the presenter. The purpose of this lesson is to promote a comprehensive and effective weekly exercise routine in order to improve patients' overall level of fitness.   Managing Heart Disease: Your Path to a Healthier Heart Clinical staff led group instruction and group discussion with PowerPoint presentation and patient guidebook. To enhance the learning environment the use of posters, models and videos may be added.At the conclusion of this workshop, patients will understand the  anatomy and physiology of the heart. Additionally, they will understand how Pritikin's three pillars impact the risk factors, the progression, and the management of heart disease.  The purpose of this lesson is to provide a high-level overview of the heart, heart disease, and how the Pritikin lifestyle positively impacts risk factors.  Exercise Biomechanics Clinical staff led group instruction and group discussion with PowerPoint presentation and patient guidebook. To enhance the learning environment the use of posters, models and videos may be added. Patients will learn how the structural parts of their bodies function and how these functions impact their daily activities, movement, and exercise. Patients will learn how to promote a neutral spine, learn how to manage pain, and identify ways to improve their physical movement in order to promote healthy living. The purpose of this lesson is to expose patients to common physical limitations that impact physical activity. Participants will learn practical ways to adapt and manage aches and pains, and to minimize their effect on regular exercise. Patients will learn how to maintain good posture while sitting, walking, and lifting.  Balance Training and Fall Prevention  Clinical staff led group instruction and group discussion with PowerPoint presentation and patient guidebook. To enhance the learning environment the use of posters, models and videos may be added. At the conclusion of this workshop, patients will understand the importance of their sensorimotor skills (vision, proprioception, and the vestibular system) in maintaining their ability to balance as they age. Patients will apply a variety of balancing exercises that are appropriate for their current level of function. Patients will understand the common causes for poor balance, possible solutions to these problems, and ways to modify their physical environment in order to minimize their  fall risk. The purpose of this lesson is to teach patients about the importance of maintaining balance as they age and ways to minimize their risk of falling.  WORKSHOPS   Nutrition:  Fueling a Scientist, research (physical sciences) led group instruction and group discussion with PowerPoint presentation and patient guidebook. To enhance the learning environment the use of posters, models and videos may be added. Patients will review the foundational principles of the [place] and understand what constitutes a serving size in each of the food groups. Patients will also learn Pritikin-friendly foods that are better choices when away from home and review make-ahead meal and snack options. Calorie  density will be reviewed and applied to three nutrition priorities: weight maintenance, weight loss, and weight gain. The purpose of this lesson is to reinforce (in a group setting) the key concepts around what patients are recommended to eat and how to apply these guidelines when away from home by planning and selecting Pritikin-friendly options. Patients will understand how calorie density may be adjusted for different weight management goals.  Mindful Eating  Clinical staff led group instruction and group discussion with PowerPoint presentation and patient guidebook. To enhance the learning environment the use of posters, models and videos may be added. Patients will briefly review the concepts of the [place] and the importance of low-calorie dense foods. The concept of mindful eating will be introduced as well as the importance of paying attention to internal hunger signals. Triggers for non-hunger eating and techniques for dealing with triggers will be explored. The purpose of this lesson is to provide patients with the opportunity to review the basic principles of the [place], discuss the value of eating mindfully and how to measure internal cues of hunger and fullness using the  Hunger Scale. Patients will also discuss reasons for non-hunger eating and learn strategies to use for controlling emotional eating.  Targeting Your Nutrition Priorities Clinical staff led group instruction and group discussion with PowerPoint presentation and patient guidebook. To enhance the learning environment the use of posters, models and videos may be added. Patients will learn how to determine their genetic susceptibility to disease by reviewing their family history. Patients will gain insight into the importance of diet as part of an overall healthy lifestyle in mitigating the impact of genetics and other environmental insults. The purpose of this lesson is to provide patients with the opportunity to assess their personal nutrition priorities by looking at their family history, their own health history and current risk factors. Patients will also be able to discuss ways of prioritizing and modifying the [place] for their highest risk areas  Menu  Clinical staff led group instruction and group discussion with PowerPoint presentation and patient guidebook. To enhance the learning environment the use of posters, models and videos may be added. Using menus brought in from [employer], or printed from [employer], patients will apply the [place] dining out guidelines that were presented in the [employer] video. Patients will also be able to practice these guidelines in a variety of provided scenarios. The purpose of this lesson is to provide patients with the opportunity to practice hands-on learning of the [place] with actual menus and practice scenarios.  Label Reading Clinical staff led group instruction and group discussion with PowerPoint presentation and patient guidebook. To enhance the learning environment the use of posters, models and videos may be added. Patients will review and discuss the Pritikin label reading guidelines  presented in Pritikin's Label Reading Educational series video. Using fool labels brought in from local grocery stores and markets, patients will apply the label reading guidelines and determine if the packaged food meet the Pritikin guidelines. The purpose of this lesson is to provide patients with the opportunity to review, discuss, and practice hands-on learning of the Pritikin Label Reading guidelines with actual packaged food labels. [place] Workshops are designed to teach patients ways to prepare quick, simple, and affordable recipes at home. The importance of nutrition's role in chronic disease risk reduction is reflected in its emphasis in the overall Pritikin program. By learning  how to prepare essential core Pritikin Eating Plan recipes, patients will increase control over what they eat; be able to customize the flavor of foods without the use of added salt, sugar, or fat; and improve the quality of the food they consume. By learning a set of core recipes which are easily assembled, quickly prepared, and affordable, patients are more likely to prepare more healthy foods at home. These workshops focus on convenient breakfasts, simple entres, side dishes, and desserts which can be prepared with minimal effort and are consistent with nutrition recommendations for cardiovascular risk reduction. Cooking [employer] are taught by a Engineer, materials (RD) who has been trained by the [employer]. The chef or RD has a clear understanding of the importance of minimizing - if not completely eliminating - added fat, sugar, and sodium in recipes. Throughout the series of [place] Workshop sessions, patients will learn about healthy ingredients and efficient methods of cooking to build confidence in their capability to prepare    Cooking School weekly topics:  Adding Flavor- Sodium-Free  Fast and Healthy Breakfasts  Powerhouse Plant-Based  Proteins  Satisfying Salads and Dressings  Simple Sides and Sauces  International Cuisine-Spotlight on the [place] Zones  Delicious Desserts  Savory Soups  Efficiency Cooking - Meals in a Snap  Tasty Appetizers and Snacks  Comforting Weekend Breakfasts  One-Pot Wonders   Fast Evening Meals  Easy [place] (Psychosocial): New Thoughts, New Behaviors Clinical staff led group instruction and group discussion with PowerPoint presentation and patient guidebook. To enhance the learning environment the use of posters, models and videos may be added. Patients will learn and practice techniques for developing effective health and lifestyle goals. Patients will be able to effectively apply the goal setting process learned to develop at least one new personal goal.  The purpose of this lesson is to expose patients to a new skill set of behavior modification techniques such as techniques setting SMART goals, overcoming barriers, and achieving new thoughts and new behaviors.  Managing Moods and Relationships Clinical staff led group instruction and group discussion with PowerPoint presentation and patient guidebook. To enhance the learning environment the use of posters, models and videos may be added. Patients will learn how emotional and chronic stress factors can impact their health and relationships. They will learn healthy ways to manage their moods and utilize positive coping mechanisms. In addition, ICR patients will learn ways to improve communication skills. The purpose of this lesson is to expose patients to ways of understanding how one's mood and health are intimately connected. Developing a healthy outlook can help build positive relationships and connections with others. Patients will understand the importance of utilizing effective communication skills that include actively listening and being heard. They will learn and  understand the importance of the "4 Cs" and especially Connections in fostering of a Healthy Mind-Set.  Healthy Sleep for a Healthy Heart Clinical staff led group instruction and group discussion with PowerPoint presentation and patient guidebook. To enhance the learning environment the use of posters, models and videos may be added. At the conclusion of this workshop, patients will be able to demonstrate knowledge of the importance of sleep to overall health, well-being, and quality of life. They will understand the symptoms of, and treatments for, common sleep disorders. Patients will also be able to identify daytime and nighttime behaviors which impact sleep, and they will be able to apply these tools  to help manage sleep-related challenges. The purpose of this lesson is to provide patients with a general overview of sleep and outline the importance of quality sleep. Patients will learn about a few of the most common sleep disorders. Patients will also be introduced to the concept of "sleep hygiene," and discover ways to self-manage certain sleeping problems through simple daily behavior changes. Finally, the workshop will motivate patients by clarifying the links between quality sleep and their goals of heart-healthy living.   Recognizing and Reducing Stress Clinical staff led group instruction and group discussion with PowerPoint presentation and patient guidebook. To enhance the learning environment the use of posters, models and videos may be added. At the conclusion of this workshop, patients will be able to understand the types of stress reactions, differentiate between acute and chronic stress, and recognize the impact that chronic stress has on their health. They will also be able to apply different coping mechanisms, such as reframing negative self-talk. Patients will have the opportunity to practice a variety of stress management techniques, such as deep abdominal breathing, progressive muscle  relaxation, and/or guided imagery.  The purpose of this lesson is to educate patients on the role of stress in their lives and to provide healthy techniques for coping with it.  Learning Barriers/Preferences:  Learning Barriers/Preferences - 01/09/23 1439       Learning Barriers/Preferences   Learning Barriers None    Learning Preferences Written Material;Computer/Internet;Skilled Demonstration;Individual Instruction;Group Instruction             Education Topics:  Knowledge Questionnaire Score:  Knowledge Questionnaire Score - 01/09/23 1439       Knowledge Questionnaire Score   Pre Score 22/24             Core Components/Risk Factors/Patient Goals at Admission:  Personal Goals and Risk Factors at Admission - 01/09/23 1525       Core Components/Risk Factors/Patient Goals on Admission    Weight Management Yes;Weight Loss    Intervention Weight Management: Develop a combined nutrition and exercise program designed to reach desired caloric intake, while maintaining appropriate intake of nutrient and fiber, sodium and fats, and appropriate energy expenditure required for the weight goal.;Weight Management: Provide education and appropriate resources to help participant work on and attain dietary goals.;Weight Management/Obesity: Establish reasonable short term and long term weight goals.;Obesity: Provide education and appropriate resources to help participant work on and attain dietary goals.    Admit Weight 240 lb 4.8 oz (109 kg)    Expected Outcomes Short Term: Continue to assess and modify interventions until short term weight is achieved;Long Term: Adherence to nutrition and physical activity/exercise program aimed toward attainment of established weight goal;Weight Loss: Understanding of general recommendations for a balanced deficit meal plan, which promotes 1-2 lb weight loss per week and includes a negative energy balance of [phone] kcal/d;Understanding recommendations for  meals to include 15-35% energy as protein, 25-35% energy from fat, 35-60% energy from carbohydrates, less than '200mg'$  of dietary cholesterol, 20-35 gm of total fiber daily;Understanding of distribution of calorie intake throughout the day with the consumption of 4-5 meals/snacks    Hypertension Yes    Intervention Provide education on lifestyle modifcations including regular physical activity/exercise, weight management, moderate sodium restriction and increased consumption of fresh fruit, vegetables, and low fat dairy, alcohol moderation, and smoking cessation.;Monitor prescription use compliance.    Expected Outcomes Short Term: Continued assessment and intervention until BP is < 140/40m HG in hypertensive participants. < 130/863mHG in hypertensive participants  with diabetes, heart failure or chronic kidney disease.;Long Term: Maintenance of blood pressure at goal levels.             Core Components/Risk Factors/Patient Goals Review:    Core Components/Risk Factors/Patient Goals at Discharge (Final Review):    ITP Comments:  ITP Comments     Row Name 01/09/23 1359           ITP Comments Fransico Him, MD - Medical Director. Introduction to the Pritikin Education Program/Intensive Cardiac Rehab. Initial orientation packet reviewed with the patient                Comments: Participant attended orientation for the cardiac rehabilitation program on  01/09/2023  to perform initial intake and exercise walk test. Patient introduced to the [place] education and orientation packet was reviewed. Completed 6-minute walk test, measurements, initial ITP, and exercise prescription. Vital signs stable. Telemetry-normal sinus rhythm, RBBB,  asymptomatic.   Service time was from 10:22 to 12:52 .

## 2023-01-09 NOTE — Progress Notes (Signed)
Cardiac Rehab Medication Review   Does the patient  feel that his/her medications are working for him/her?  yes  Has the patient been experiencing any side effects to the medications prescribed?  yes  Does the patient measure his/her own blood pressure or blood glucose at home?  no   Does the patient have any problems obtaining medications due to transportation or finances?   no  Understanding of regimen: excellent Understanding of indications: excellent Potential of compliance: excellent    Comments: Pt has good understanding of her medications. Pt was checking her BP after discharge from the hospital but in no longer doing that as she has been feeling well. Pt voices feeling shaky due to the tacrolimus. Pt also voices the swelling in her face and neck "moon face" from the prednisone.     Lesly Rubenstein 01/09/2023 2:43 PM

## 2023-01-15 ENCOUNTER — Encounter (HOSPITAL_COMMUNITY): Payer: BC Managed Care – PPO

## 2023-01-17 ENCOUNTER — Encounter (HOSPITAL_COMMUNITY)
Admission: RE | Admit: 2023-01-17 | Discharge: 2023-01-17 | Disposition: A | Discharge status: Home / Self Care | Payer: BC Managed Care – PPO | Source: Ambulatory Visit | Attending: Cardiology | Admitting: Cardiology

## 2023-01-17 DIAGNOSIS — Z941 Heart transplant status: Secondary | ICD-10-CM

## 2023-01-17 NOTE — Progress Notes (Signed)
Daily Session Note  Patient Details  Name: [name] MRN: [id number] Date of Birth: 05-01-1996 Referring Provider:   Flowsheet Row INTENSIVE CARDIAC REHAB ORIENT from 01/09/2023 in [facility]  Referring Provider Dr. Mosetta Pigeon Fransico Him, MD covering)       Encounter Date: 01/17/2023  Check In:  Session Check In - 01/17/23 0916       Check-In   Supervising physician immediately available to respond to emergencies [facility] MD immediately available    Physician(s) Ambrose Pancoast, NP    Location [facility]    Staff Present Lesly Rubenstein, MS, ACSM-CEP, CCRP, Exercise Physiologist;Bernyce Brimley, RN, Deland Pretty, MS, ACSM-CEP, Exercise Physiologist;Jetta Gilford Rile BS, ACSM-CEP, Exercise Physiologist;Johnny Starleen Blue, MS, Exercise Physiologist    Virtual Visit No    Medication changes reported     Yes    Comments Lasix discontinued, Prednisone decreased to 10 mg.    Fall or balance concerns reported    No    Tobacco Cessation No Change    Warm-up and Cool-down Performed as group-led instruction    Resistance Training Performed No    VAD Patient? No    PAD/SET Patient? No      Pain Assessment   Currently in Pain? No/denies    Pain Score 0-No pain    Multiple Pain Sites No             Capillary Blood Glucose: No results found for this or any previous visit (from the past 24 hour(s)).   Exercise Prescription Changes - 01/17/23 1200       Response to Exercise   Blood Pressure (Admit) 124/80    Blood Pressure (Exercise) 138/88    Blood Pressure (Exit) 110/80    Heart Rate (Admit) 92 bpm    Heart Rate (Exercise) 117 bpm    Heart Rate (Exit) 101 bpm    Rating of Perceived Exertion (Exercise) 13    Symptoms None    Comments Pt's first day in the [facility] program    Duration Continue with 30 min of aerobic exercise without signs/symptoms of physical distress.    Intensity THRR unchanged       Progression   Progression Continue to progress workloads to maintain intensity without signs/symptoms of physical distress.    Average METs 2.45      Resistance Training   Training Prescription No    Weight No weights on Wednesdays      Interval Training   Interval Training No      Arm Ergometer   Level 1.5    RPM 6    Minutes 15    METs 1.9      Recumbant Elliptical   Level 2    RPM 64    Watts 86    Minutes 15    METs 3             Social History   Tobacco Use  Smoking Status Never  Smokeless Tobacco Never    Goals Met:  Exercise tolerated well No report of concerns or symptoms today  Goals Unmet:  Not Applicable  Comments: Pt started cardiac rehab today.  Pt tolerated light exercise without difficulty. VSS, telemetry-Sinus Rhythm, asymptomatic.  Medication list reconciled. Pt denies barriers to medicaiton compliance.  PSYCHOSOCIAL ASSESSMENT:  PHQ-1. Pt exhibits positive coping skills, hopeful outlook with supportive family. No psychosocial needs identified at this time, no psychosocial interventions necessary.    Pt enjoys volleyball, cooking,  reading, outdoors, hiking and meditation.   Pt oriented to exercise equipment and routine.    Understanding verbalized. Harrell Gave RN BSN    Dr. Fransico Him is Medical Director for Cardiac Rehab at [facility].

## 2023-01-19 ENCOUNTER — Encounter (HOSPITAL_COMMUNITY)
Admission: RE | Admit: 2023-01-19 | Discharge: 2023-01-19 | Disposition: A | Discharge status: Home / Self Care | Payer: BC Managed Care – PPO | Source: Ambulatory Visit | Attending: Cardiology | Admitting: Cardiology

## 2023-01-19 DIAGNOSIS — Z941 Heart transplant status: Secondary | ICD-10-CM | POA: Diagnosis not present

## 2023-01-19 DIAGNOSIS — I5022 Chronic systolic (congestive) heart failure: Secondary | ICD-10-CM

## 2023-01-22 ENCOUNTER — Encounter (HOSPITAL_COMMUNITY)
Admission: RE | Admit: 2023-01-22 | Discharge: 2023-01-22 | Disposition: A | Discharge status: Home / Self Care | Payer: BC Managed Care – PPO | Source: Ambulatory Visit | Attending: Cardiology | Admitting: Cardiology

## 2023-01-22 DIAGNOSIS — Z941 Heart transplant status: Secondary | ICD-10-CM

## 2023-01-22 DIAGNOSIS — I5022 Chronic systolic (congestive) heart failure: Secondary | ICD-10-CM

## 2023-01-24 ENCOUNTER — Encounter (HOSPITAL_COMMUNITY)
Admission: RE | Admit: 2023-01-24 | Discharge: 2023-01-24 | Disposition: A | Discharge status: Home / Self Care | Payer: BC Managed Care – PPO | Source: Ambulatory Visit | Attending: Cardiology | Admitting: Cardiology

## 2023-01-24 DIAGNOSIS — Z941 Heart transplant status: Secondary | ICD-10-CM

## 2023-01-24 DIAGNOSIS — I5022 Chronic systolic (congestive) heart failure: Secondary | ICD-10-CM

## 2023-01-24 NOTE — Progress Notes (Signed)
Cardiac Individual Treatment Plan  Patient Details  Name: [name] MRN: [id number] Date of Birth: [birth year] Referring Provider:   Flowsheet Row INTENSIVE CARDIAC REHAB ORIENT from 01/09/2023 in [facility]  Referring Provider Dr. Mosetta Pigeon (Fransico Him, MD covering)       Initial Encounter Date:  [place] from 01/09/2023 in [facility], V[facility], & [place]  Date 01/09/23       Visit Diagnosis: 10/31/22 Heart transplant at [place] failure, chronic systolic ([place])  Patient's Home Medications on Admission:  Current Outpatient Medications:    acetaminophen (TYLENOL) 325 MG tablet, Take 650 mg by mouth every 6 (six) hours as needed., Disp: , Rfl:    ASPIRIN ADULT LOW DOSE 81 MG tablet, Take 81 mg by mouth daily., Disp: , Rfl:    buPROPion (WELLBUTRIN SR) 150 MG 12 hr tablet, Take 150 mg by mouth daily., Disp: , Rfl:    Calcium Carb-Cholecalciferol 600-10 MG-MCG TABS, Take 600 mcg by mouth in the morning and at bedtime., Disp: , Rfl:    diazepam (VALIUM) 5 MG tablet, Take 5 mg by mouth as needed. Prior to biopsy, Disp: , Rfl:    ELIQUIS 5 MG TABS tablet, Take 5 mg by mouth 2 (two) times daily., Disp: , Rfl:    fluconazole (DIFLUCAN) 150 MG tablet, Take 1 tablet (150 mg total) by mouth as needed. (Patient not taking: Reported on 01/09/2023), Disp: 30 tablet, Rfl: 0   furosemide (LASIX) 40 MG tablet, Take 1 tablet (40 mg total) by mouth daily., Disp: 30 tablet, Rfl: 6   gabapentin (NEURONTIN) 400 MG capsule, Take 400 mg by mouth 3 (three) times daily., Disp: , Rfl:    hydrOXYzine (VISTARIL) 25 MG capsule, Take 25 mg by mouth 2 (two) times daily as needed for anxiety., Disp: , Rfl:    ketoconazole (NIZORAL) 2 % cream, Apply 1 Application topically daily as needed for irritation., Disp: , Rfl:    ketoconazole (NIZORAL) 2 % shampoo, Apply 1 Application  topically 2 (two) times a week. As needed, Disp: , Rfl:    methocarbamol (ROBAXIN) 500 MG tablet, Take 500 mg by mouth 3 (three) times daily as needed for muscle spasms., Disp: , Rfl:    mycophenolate (CELLCEPT) 250 MG capsule, Take 1,000 mg by mouth 2 (two) times daily., Disp: , Rfl:    nystatin (MYCOSTATIN) 100000 UNIT/ML suspension, Take 5 mLs by mouth 4 (four) times daily., Disp: , Rfl:    pantoprazole (PROTONIX) 40 MG tablet, Take 40 mg by mouth daily., Disp: , Rfl:    pravastatin (PRAVACHOL) 40 MG tablet, Take 40 mg by mouth daily., Disp: , Rfl:    predniSONE (DELTASONE) 5 MG tablet, Take 10 mg by mouth daily with breakfast. Take as directed, Disp: , Rfl:    sertraline (ZOLOFT) 100 MG tablet, Take 100 mg by mouth daily., Disp: , Rfl:    sulfamethoxazole-trimethoprim (BACTRIM) 400-80 MG tablet, Take 1 tablet by mouth daily., Disp: , Rfl:    tacrolimus (PROGRAF) 1 MG capsule, Take 1 mg by mouth 2 (two) times daily. 5 mg at 0900 4 mg at 2100, Disp: , Rfl:    valACYclovir (VALTREX) 1000 MG tablet, Take 1,000 mg by mouth as needed for rash. (Patient not taking: Reported on 01/09/2023), Disp: , Rfl:    valGANciclovir (VALCYTE) 450 MG tablet, Take 900 mg by mouth daily., Disp: , Rfl:  Past Medical History: Past Medical History:  Diagnosis Date   Lactose intolerance    NICM (nonischemic cardiomyopathy) ([place])    PVC's (premature ventricular contractions)    Torsades de pointes (HCC)     Tobacco Use: Social History   Tobacco Use  Smoking Status Never  Smokeless Tobacco Never    Labs: Review Flowsheet       Latest Ref Rng & Units 09/01/2022  Labs for ITP Cardiac and Pulmonary Rehab  Bicarbonate 20.0 - 28.0 mmol/L 21.9  19.0  21.9   TCO2 22 - 32 mmol/L 23  20  23    Acid-base deficit 0.0 - 2.0 mmol/L 3.0  5.0  3.0   O2 Saturation % 68  66  66     Capillary Blood Glucose: No results found for: "GLUCAP"   Exercise Target Goals: Exercise Program Goal: Individual exercise  prescription set using results from initial 6 min walk test and THRR while considering  patient's activity barriers and safety.   Exercise Prescription Goal: Initial exercise prescription builds to 30-45 minutes a day of aerobic activity, 2-3 days per week.  Home exercise guidelines will be given to patient during program as part of exercise prescription that the participant will acknowledge.  Activity Barriers & Risk Stratification:  Activity Barriers & Cardiac Risk Stratification - 01/09/23 1429       Activity Barriers & Cardiac Risk Stratification   Activity Barriers Muscular Weakness;Deconditioning;Back Problems    Cardiac Risk Stratification High             6 Minute Walk:  6 Minute Walk     Row Name 01/09/23 1209         6 Minute Walk   Phase Initial     Distance 1650 feet     Walk Time 6 minutes     # of Rest Breaks 0     MPH 3.13     METS 5.81     RPE 12     Perceived Dyspnea  0     VO2 Peak 20.3     Symptoms Yes (comment)     Comments Bilateral calf tightness     Resting HR 88 bpm     Resting BP 130/90     Resting Oxygen Saturation  98 %     Exercise Oxygen Saturation  during 6 min walk 96 %     Max Ex. HR 111 bpm     Max Ex. BP 164/88     2 Minute Post BP 126/80              Oxygen Initial Assessment:   Oxygen Re-Evaluation:   Oxygen Discharge (Final Oxygen Re-Evaluation):   Initial Exercise Prescription:  Initial Exercise Prescription - 01/09/23 1400       Date of Initial Exercise RX and Referring Provider   Date 01/09/23    Referring Provider Dr. Mosetta Pigeon Fransico Him, MD covering)    Expected Discharge Date 03/23/23      Arm Ergometer   Level 1.5    Watts 25    RPM 60    Minutes 15    METs 5.8      Recumbant Elliptical   Level 2    RPM 60    Watts 90    Minutes 15    METs 5.8      Prescription Details   Frequency (times per week) 3    Duration Progress to 30 minutes of continuous aerobic without signs/symptoms of  physical distress  Intensity   THRR 40-80% of Max Heartrate 78-156    Ratings of Perceived Exertion 11-13    Perceived Dyspnea 0-4      Progression   Progression Continue progressive overload as per policy without signs/symptoms or physical distress.      Resistance Training   Training Prescription Yes    Weight 3 lbs    Reps 10-15             Perform Capillary Blood Glucose checks as needed.  Exercise Prescription Changes:   Exercise Prescription Changes     Row Name 01/17/23 1200             Response to Exercise   Blood Pressure (Admit) 124/80       Blood Pressure (Exercise) 138/88       Blood Pressure (Exit) 110/80       Heart Rate (Admit) 92 bpm       Heart Rate (Exercise) 117 bpm       Heart Rate (Exit) 101 bpm       Rating of Perceived Exertion (Exercise) 13       Symptoms None       Comments Pt's first day in the CRP2 program       Duration Continue with 30 min of aerobic exercise without signs/symptoms of physical distress.       Intensity THRR unchanged         Progression   Progression Continue to progress workloads to maintain intensity without signs/symptoms of physical distress.       Average METs 2.45         Resistance Training   Training Prescription No       Weight No weights on Wednesdays         Interval Training   Interval Training No         Arm Ergometer   Level 1.5       RPM 6       Minutes 15       METs 1.9         Recumbant Elliptical   Level 2       RPM 64       Watts 86       Minutes 15       METs 3                Exercise Comments:   Exercise Comments     Row Name 01/17/23 1218           Exercise Comments Pt's first day in the cRP2 program. Pt exercised with no complaints.                Exercise Goals and Review:   Exercise Goals     Row Name 01/09/23 1430             Exercise Goals   Increase Physical Activity Yes       Intervention Provide advice, education, support and  counseling about physical activity/exercise needs.;Develop an individualized exercise prescription for aerobic and resistive training based on initial evaluation findings, risk stratification, comorbidities and participant's personal goals.       Expected Outcomes Short Term: Attend rehab on a regular basis to increase amount of physical activity.;Long Term: Add in home exercise to make exercise part of routine and to increase amount of physical activity.;Long Term: Exercising regularly at least 3-5 days a week.       Increase Strength and Stamina Yes  Intervention Provide advice, education, support and counseling about physical activity/exercise needs.;Develop an individualized exercise prescription for aerobic and resistive training based on initial evaluation findings, risk stratification, comorbidities and participant's personal goals.       Expected Outcomes Short Term: Increase workloads from initial exercise prescription for resistance, speed, and METs.;Short Term: Perform resistance training exercises routinely during rehab and add in resistance training at home;Long Term: Improve cardiorespiratory fitness, muscular endurance and strength as measured by increased METs and functional capacity (6MWT)       Able to understand and use rate of perceived exertion (RPE) scale Yes       Intervention Provide education and explanation on how to use RPE scale       Expected Outcomes Short Term: Able to use RPE daily in rehab to express subjective intensity level;Long Term:  Able to use RPE to guide intensity level when exercising independently       Knowledge and understanding of Target Heart Rate Range (THRR) Yes       Intervention Provide education and explanation of THRR including how the numbers were predicted and where they are located for reference       Expected Outcomes Short Term: Able to state/look up THRR;Long Term: Able to use THRR to govern intensity when exercising independently;Short Term:  Able to use daily as guideline for intensity in rehab       Understanding of Exercise Prescription Yes       Intervention Provide education, explanation, and written materials on patient's individual exercise prescription       Expected Outcomes Short Term: Able to explain program exercise prescription;Long Term: Able to explain home exercise prescription to exercise independently                Exercise Goals Re-Evaluation :  Exercise Goals Re-Evaluation     Row Name 01/17/23 1217             Exercise Goal Re-Evaluation   Exercise Goals Review Increase Physical Activity;Increase Strength and Stamina;Able to understand and use rate of perceived exertion (RPE) scale;Knowledge and understanding of Target Heart Rate Range (THRR);Understanding of Exercise Prescription       Comments Pt's first day in the CRP2 program. Pt understnads the exercise Rx, RPE scale and THRR.       Expected Outcomes Will continue to montior patient and progress exercise workloads as tolerated.                Discharge Exercise Prescription (Final Exercise Prescription Changes):  Exercise Prescription Changes - 01/17/23 1200       Response to Exercise   Blood Pressure (Admit) 124/80    Blood Pressure (Exercise) 138/88    Blood Pressure (Exit) 110/80    Heart Rate (Admit) 92 bpm    Heart Rate (Exercise) 117 bpm    Heart Rate (Exit) 101 bpm    Rating of Perceived Exertion (Exercise) 13    Symptoms None    Comments Pt's first day in the CRP2 program    Duration Continue with 30 min of aerobic exercise without signs/symptoms of physical distress.    Intensity THRR unchanged      Progression   Progression Continue to progress workloads to maintain intensity without signs/symptoms of physical distress.    Average METs 2.45      Resistance Training   Training Prescription No    Weight No weights on Wednesdays      Interval Training   Interval Training No  Arm Ergometer   Level 1.5    RPM  6    Minutes 15    METs 1.9      Recumbant Elliptical   Level 2    RPM 64    Watts 86    Minutes 15    METs 3             Nutrition:  Target Goals: Understanding of nutrition guidelines, daily intake of sodium 1500mg , cholesterol 200mg , calories 30% from fat and 7% or less from saturated fats, daily to have 5 or more servings of fruits and vegetables.  Biometrics:  Pre Biometrics - 01/09/23 1042       Pre Biometrics   Waist Circumference 42.5 inches    Hip Circumference 49 inches    Waist to Hip Ratio 0.87 %    Triceps Skinfold 47 mm    % Body Fat 46.4 %    Grip Strength 33 kg    Flexibility 18 in    Single Leg Stand 30 seconds              Nutrition Therapy Plan and Nutrition Goals:  Nutrition Therapy & Goals - 01/17/23 1001       Nutrition Therapy   Diet Heart Healthy Diet    Drug/Food Interactions Statins/Certain Fruits      Personal Nutrition Goals   Nutrition Goal Patient to identify strategies for reducing cardiovascular risk by attending the Pritikin education and nutrition series weekly.    Personal Goal #2 Patient to improve diet quality by using the plate method as a guide for meal planning to include lean protein/plant protein, fruits, vegetables, whole grains, nonfat dairy as part of a well-balanced diet.    Personal Goal #3 Patient to identify strategies for weight loss of 0.5-2.0# per week.    Comments Kaycia reports weight gain of ~80# over the last year and she is motivated to lose weight. Per transplant notes, she will begin to wean off prednisone. 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 Shadonna will benefit from participation in intensive cardiac rehab for nutrition, exercise, and lifestyle modification.      Intervention Plan   Intervention Prescribe, educate and counsel regarding individualized specific dietary modifications aiming towards targeted core components such as weight, hypertension, lipid  management, diabetes, heart failure and other comorbidities.;Nutrition handout(s) given to patient.    Expected Outcomes Short Term Goal: Understand basic principles of dietary content, such as calories, fat, sodium, cholesterol and nutrients.;Long Term Goal: Adherence to prescribed nutrition plan.             Nutrition Assessments:  MEDIFICTS Score Key: ?70 Need to make dietary changes  40-70 Heart Healthy Diet ? 40 Therapeutic Level Cholesterol Diet   Flowsheet Row Cardiac Rehab from 06/22/2021 in [facility]  Picture Your Plate Total Score on Admission 78  Picture Your Plate Total Score on Discharge 73      Picture Your Plate Scores: [id number] Unhealthy dietary pattern with much room for improvement. 41-50 Dietary pattern unlikely to meet recommendations for good health and room for improvement. 51-60 More healthful dietary pattern, with some room for improvement.  >60 Healthy dietary pattern, although there may be some specific behaviors that could be improved.    Nutrition Goals Re-Evaluation:  Nutrition Goals Re-Evaluation     [place] Name 01/17/23 1001             Goals   Current Weight 242 lb 8.1 oz (110  kg)       Comment labs WNL       Expected Outcome Zanaiyah reports weight gain of ~80# over the last year and she is motivated to lose weight. Per transplant notes, she will begin to wean off prednisone. 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 Senta will benefit from participation in intensive cardiac rehab for nutrition, exercise, and lifestyle modification.                Nutrition Goals Re-Evaluation:  Nutrition Goals Re-Evaluation     [place] Name 01/17/23 1001             Goals   Current Weight 242 lb 8.1 oz (110 kg)       Comment labs WNL       Expected Outcome Davetta reports weight gain of ~80# over the last year and she is motivated to lose weight. Per transplant notes, she will begin to wean  off prednisone. 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 Josselynn will benefit from participation in intensive cardiac rehab for nutrition, exercise, and lifestyle modification.                Nutrition Goals Discharge (Final Nutrition Goals Re-Evaluation):  Nutrition Goals Re-Evaluation - 01/17/23 1001       Goals   Current Weight 242 lb 8.1 oz (110 kg)    Comment labs WNL    Expected Outcome Ameiah reports weight gain of ~80# over the last year and she is motivated to lose weight. Per transplant notes, she will begin to wean off prednisone. 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 Coriann will benefit from participation in intensive cardiac rehab for nutrition, exercise, and lifestyle modification.             Psychosocial: Target Goals: Acknowledge presence or absence of significant depression and/or stress, maximize coping skills, provide positive support system. Participant is able to verbalize types and ability to use techniques and skills needed for reducing stress and depression.  Initial Review & Psychosocial Screening:  Initial Psych Review & Screening - 01/09/23 1448       Initial Review   Current issues with --   Pt is taking wellbutrin and Zoloft. The pt has hx of generalized anxiety. No voiced issues with depression or stress. PHQ -9 =1.     Family Dynamics   Good Support System? Yes    Comments Has mother for support      Barriers   Psychosocial barriers to participate in program There are no identifiable barriers or psychosocial needs.      Screening Interventions   Interventions Encouraged to exercise             Quality of Life Scores:  Quality of Life - 01/09/23 1437       Quality of Life   Select Quality of Life      Quality of Life Scores   Health/Function Pre 24.75 %    Socioeconomic Pre 21.36 %    Socioeconomic Post --    Socioeconomic % Change  --    Psych/Spiritual  Pre 24 %    Family Pre 27.88 %    GLOBAL Pre 24.23 %            Scores of 19 and below usually indicate a poorer quality of life in these areas.  A difference of  2-3 points is a clinically meaningful difference.  A difference of 2-3  points in the total score of the Quality of Life Index has been associated with significant improvement in overall quality of life, self-image, physical symptoms, and general health in studies assessing change in quality of life.  PHQ-9: Review Flowsheet       01/09/2023 06/22/2021 03/30/2021  Depression screen PHQ 2/9  Decreased Interest 0 0 1  Down, Depressed, Hopeless 0 1 1  PHQ - 2 Score 0 1 2  Altered sleeping 0 0 0  Tired, decreased energy 0 0 1  Change in appetite 1 0 1  Feeling bad or failure about yourself  0 0 0  Trouble concentrating 0 0 0  Moving slowly or fidgety/restless 0 0 0  Suicidal thoughts 0 0 0  PHQ-9 Score 1 1 4   Difficult doing work/chores Not difficult at all Not difficult at all Somewhat difficult   Interpretation of Total Score  Total Score Depression Severity:  1-4 = Minimal depression, 5-9 = Mild depression, 10-14 = Moderate depression, 15-19 = Moderately severe depression, 20-27 = Severe depression   Psychosocial Evaluation and Intervention:   Psychosocial Re-Evaluation:  Psychosocial Re-Evaluation     Row Name 01/17/23 1418 01/24/23 0920           Psychosocial Re-Evaluation   Current issues with None Identified None Identified      Interventions Encouraged to attend Cardiac Rehabilitation for the exercise Encouraged to attend Cardiac Rehabilitation for the exercise      Continue Psychosocial Services  No Follow up required No Follow up required               Psychosocial Discharge (Final Psychosocial Re-Evaluation):  Psychosocial Re-Evaluation - 01/24/23 0920       Psychosocial Re-Evaluation   Current issues with None Identified    Interventions Encouraged to attend Cardiac Rehabilitation for the  exercise    Continue Psychosocial Services  No Follow up required             Vocational Rehabilitation: Provide vocational rehab assistance to qualifying candidates.   Vocational Rehab Evaluation & Intervention:  Vocational Rehab - 01/09/23 1440       Initial Vocational Rehab Evaluation & Intervention   Assessment shows need for Vocational Rehabilitation No      Vocational Rehab Re-Evaulation   Comments Pt is a registered dietican. Pt was not at her job long enough to qualify for FMLA benefits. 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             Education: Education Goals: Education classes will be provided on a weekly basis, covering required topics. Participant will state understanding/return demonstration of topics presented.    Education     Row Name 01/17/23 1000     Education   Cardiac Education Topics [employer]   Educator Dietitian   Weekly Topic Fast and Healthy Breakfasts   Instruction Review Code 1- Verbalizes Understanding   Class Start Time 0813   Class Stop Time 0856   Class Time Calculation (min) 43 min    [place] Name 01/19/23 0900     Education   Cardiac Education Topics Pritikin   Select Core Videos     Core Videos   Educator Dietitian   Select Nutrition   Instruction Review Code 1- Verbalizes Understanding   Class Start Time 0815   Class Stop Time 0905   Class Time Calculation (min) 50 min    [place] Name 01/22/23  0900     Education   Cardiac Education Topics Pritikin   Environmental consultant Psychosocial   Psychosocial Workshop Recognizing and Reducing Stress   Instruction Review Code 1- Verbalizes Understanding   Class Start Time [phone]   Class Stop Time [phone]   Class Time Calculation (min) 41 min            Core Videos: Exercise    Move It!  Clinical staff conducted group or individual video education with verbal and  written material and guidebook.  Patient learns the recommended Pritikin exercise program. Exercise with the goal of living a long, healthy life. Some of the health benefits of exercise include controlled diabetes, healthier blood pressure levels, improved cholesterol levels, improved heart and lung capacity, improved sleep, and better body composition. Everyone should speak with their doctor before starting or changing an exercise routine.  Biomechanical Limitations Clinical staff conducted group or individual video education with verbal and written material and guidebook.  Patient learns how biomechanical limitations can impact exercise and how we can mitigate and possibly overcome limitations to have an impactful and balanced exercise routine.  Body Composition Clinical staff conducted group or individual video education with verbal and written material and guidebook.  Patient learns that body composition (ratio of muscle mass to fat mass) is a key component to assessing overall fitness, rather than body weight alone. Increased fat mass, especially visceral belly fat, can put [place] at increased risk for metabolic syndrome, type 2 diabetes, heart disease, and even death. It is recommended to combine diet and exercise (cardiovascular and resistance training) to improve your body composition. Seek guidance from your physician and exercise physiologist before implementing an exercise routine.  Exercise Action Plan Clinical staff conducted group or individual video education with verbal and written material and guidebook.  Patient learns the recommended strategies to achieve and enjoy long-term exercise adherence, including variety, self-motivation, self-efficacy, and positive decision making. Benefits of exercise include fitness, good health, weight management, more energy, better sleep, less stress, and overall well-being.  Medical   Heart Disease Risk Reduction Clinical staff conducted group or  individual video education with verbal and written material and guidebook.  Patient learns our heart is our most vital organ as it circulates oxygen, nutrients, white blood cells, and hormones throughout the entire body, and carries waste away. Data supports a plant-based eating plan like the Pritikin Program for its effectiveness in slowing progression of and reversing heart disease. The video provides a number of recommendations to address heart disease.   Metabolic Syndrome and Belly Fat  Clinical staff conducted group or individual video education with verbal and written material and guidebook.  Patient learns what metabolic syndrome is, how it leads to heart disease, and how one can reverse it and keep it from coming back. You have metabolic syndrome if you have 3 of the following 5 criteria: abdominal obesity, high blood pressure, high triglycerides, low HDL cholesterol, and high blood sugar.  Hypertension and Heart Disease Clinical staff conducted group or individual video education with verbal and written material and guidebook.  Patient learns that high blood pressure, or hypertension, is very common in the [place]. Hypertension is largely due to excessive salt intake, but other important risk factors include being overweight, physical inactivity, drinking too much alcohol, smoking, and not eating enough potassium from fruits and vegetables. High blood pressure is a leading risk factor for heart attack, stroke,  congestive heart failure, dementia, kidney failure, and premature death. Long-term effects of excessive salt intake include stiffening of the arteries and thickening of heart muscle and organ damage. Recommendations include ways to reduce hypertension and the risk of heart disease.  Diseases of Our Time - Focusing on Diabetes Clinical staff conducted group or individual video education with verbal and written material and guidebook.  Patient learns why the best way to stop diseases  of our time is prevention, through food and other lifestyle changes. Medicine (such as prescription pills and surgeries) is often only a Band-Aid on the problem, not a long-term solution. Most common diseases of our time include obesity, type 2 diabetes, hypertension, heart disease, and cancer. The Pritikin Program is recommended and has been proven to help reduce, reverse, and/or prevent the damaging effects of metabolic syndrome.  Nutrition   Overview of the Pritikin Eating Plan  Clinical staff conducted group or individual video education with verbal and written material and guidebook.  Patient learns about the [place] for disease risk reduction. The [place] emphasizes a wide variety of unrefined, minimally-processed carbohydrates, like fruits, vegetables, whole grains, and legumes. Go, Caution, and Stop food choices are explained. Plant-based and lean animal proteins are emphasized. Rationale provided for low sodium intake for blood pressure control, low added sugars for blood sugar stabilization, and low added fats and oils for coronary artery disease risk reduction and weight management.  Calorie Density  Clinical staff conducted group or individual video education with verbal and written material and guidebook.  Patient learns about calorie density and how it impacts the Pritikin Eating Plan. Knowing the characteristics of the food you choose will help you decide whether those foods will lead to weight gain or weight loss, and whether you want to consume more or less of them. Weight loss is usually a side effect of the Pritikin Eating Plan because of its focus on low calorie-dense foods.  Label Reading  Clinical staff conducted group or individual video education with verbal and written material and guidebook.  Patient learns about the Pritikin recommended label reading guidelines and corresponding recommendations regarding calorie density, added sugars, sodium content,  and whole grains.  Dining Out - Part 1  Clinical staff conducted group or individual video education with verbal and written material and guidebook.  Patient learns that restaurant meals can be sabotaging because they can be so high in calories, fat, sodium, and/or sugar. Patient learns recommended strategies on how to positively address this and avoid unhealthy pitfalls.  Facts on Fats  Clinical staff conducted group or individual video education with verbal and written material and guidebook.  Patient learns that lifestyle modifications can be just as effective, if not more so, as many medications for lowering your risk of heart disease. A Pritikin lifestyle can help to reduce your risk of inflammation and atherosclerosis (cholesterol build-up, or plaque, in the artery walls). Lifestyle interventions such as dietary choices and physical activity address the cause of atherosclerosis. A review of the types of fats and their impact on blood cholesterol levels, along with dietary recommendations to reduce fat intake is also included.  Nutrition Action Plan  Clinical staff conducted group or individual video education with verbal and written material and guidebook.  Patient learns how to incorporate Pritikin recommendations into their lifestyle. Recommendations include planning and keeping personal health goals in mind as an important part of their success.  Healthy Mind-Set    Healthy Minds, Bodies, Hearts  Clinical staff conducted  group or individual video education with verbal and written material and guidebook.  Patient learns how to identify when they are stressed. Video will discuss the impact of that stress, as well as the many benefits of stress management. Patient will also be introduced to stress management techniques. The way we think, act, and feel has an impact on our hearts.  How Our Thoughts Can Heal Our Hearts  Clinical staff conducted group or individual video education with verbal  and written material and guidebook.  Patient learns that negative thoughts can cause depression and anxiety. This can result in negative lifestyle behavior and serious health problems. Cognitive behavioral therapy is an effective method to help control our thoughts in order to change and improve our emotional outlook.  Additional Videos:  Exercise    Improving Performance  Clinical staff conducted group or individual video education with verbal and written material and guidebook.  Patient learns to use a non-linear approach by alternating intensity levels and lengths of time spent exercising to help burn more calories and lose more body fat. Cardiovascular exercise helps improve heart health, metabolism, hormonal balance, blood sugar control, and recovery from fatigue. Resistance training improves strength, endurance, balance, coordination, reaction time, metabolism, and muscle mass. Flexibility exercise improves circulation, posture, and balance. Seek guidance from your physician and exercise physiologist before implementing an exercise routine and learn your capabilities and proper form for all exercise.  Introduction to Yoga  Clinical staff conducted group or individual video education with verbal and written material and guidebook.  Patient learns about yoga, a discipline of the coming together of mind, breath, and body. The benefits of yoga include improved flexibility, improved range of motion, better posture and core strength, increased lung function, weight loss, and positive self-image. Yoga's heart health benefits include lowered blood pressure, healthier heart rate, decreased cholesterol and triglyceride levels, improved immune function, and reduced stress. Seek guidance from your physician and exercise physiologist before implementing an exercise routine and learn your capabilities and proper form for all exercise.  Medical   Aging: Enhancing Your Quality of Life  Clinical staff conducted  group or individual video education with verbal and written material and guidebook.  Patient learns key strategies and recommendations to stay in good physical health and enhance quality of life, such as prevention strategies, having an advocate, securing a [place], and keeping a list of medications and system for tracking them. It also discusses how to avoid risk for bone loss.  Biology of Weight Control  Clinical staff conducted group or individual video education with verbal and written material and guidebook.  Patient learns that weight gain occurs because we consume more calories than we burn (eating more, moving less). Even if your body weight is normal, you may have higher ratios of fat compared to muscle mass. Too much body fat puts you at increased risk for cardiovascular disease, heart attack, stroke, type 2 diabetes, and obesity-related cancers. In addition to exercise, following the [place] can help reduce your risk.  Decoding Lab Results  Clinical staff conducted group or individual video education with verbal and written material and guidebook.  Patient learns that lab test reflects one measurement whose values change over time and are influenced by many factors, including medication, stress, sleep, exercise, food, hydration, pre-existing medical conditions, and more. It is recommended to use the knowledge from this video to become more involved with your lab results and evaluate your numbers to speak with your  doctor.   Diseases of Our Time - Overview  Clinical staff conducted group or individual video education with verbal and written material and guidebook.  Patient learns that according to the CDC, 50% to 70% of chronic diseases (such as obesity, type 2 diabetes, elevated lipids, hypertension, and heart disease) are avoidable through lifestyle improvements including healthier food choices, listening to satiety cues, and increased physical  activity.  Sleep Disorders Clinical staff conducted group or individual video education with verbal and written material and guidebook.  Patient learns how good quality and duration of sleep are important to overall health and well-being. Patient also learns about sleep disorders and how they impact health along with recommendations to address them, including discussing with a physician.  Nutrition  Dining Out - Part 2 Clinical staff conducted group or individual video education with verbal and written material and guidebook.  Patient learns how to plan ahead and communicate in order to maximize their dining experience in a healthy and nutritious manner. Included are recommended food choices based on the type of restaurant the patient is visiting.   Fueling a Best boy conducted group or individual video education with verbal and written material and guidebook.  There is a strong connection between our food choices and our health. Diseases like obesity and type 2 diabetes are very prevalent and are in large-part due to lifestyle choices. The Pritikin Eating Plan provides plenty of food and hunger-curbing satisfaction. It is easy to follow, affordable, and helps reduce health risks.  Menu Workshop  Clinical staff conducted group or individual video education with verbal and written material and guidebook.  Patient learns that restaurant meals can sabotage health goals because they are often packed with calories, fat, sodium, and sugar. Recommendations include strategies to plan ahead and to communicate with the manager, chef, or server to help order a healthier meal.  Planning Your Eating Strategy  Clinical staff conducted group or individual video education with verbal and written material and guidebook.  Patient learns about the [place] and its benefit of reducing the risk of disease. The [place] does not focus on calories. Instead, it emphasizes  high-quality, nutrient-rich foods. By knowing the characteristics of the foods, we choose, we can determine their calorie density and make informed decisions.  Targeting Your Nutrition Priorities  Clinical staff conducted group or individual video education with verbal and written material and guidebook.  Patient learns that lifestyle habits have a tremendous impact on disease risk and progression. This video provides eating and physical activity recommendations based on your personal health goals, such as reducing LDL cholesterol, losing weight, preventing or controlling type 2 diabetes, and reducing high blood pressure.  Vitamins and Minerals  Clinical staff conducted group or individual video education with verbal and written material and guidebook.  Patient learns different ways to obtain key vitamins and minerals, including through a recommended healthy diet. It is important to discuss all supplements you take with your doctor.   Healthy Mind-Set    Smoking Cessation  Clinical staff conducted group or individual video education with verbal and written material and guidebook.  Patient learns that cigarette smoking and tobacco addiction pose a serious health risk which affects millions of people. Stopping smoking will significantly reduce the risk of heart disease, lung disease, and many forms of cancer. Recommended strategies for quitting are covered, including working with your doctor to develop a successful plan.  Culinary   Becoming a Financial trader  conducted group or individual video education with verbal and written material and guidebook.  Patient learns that cooking at home can be healthy, cost-effective, quick, and puts them in control. Keys to cooking healthy recipes will include looking at your recipe, assessing your equipment needs, planning ahead, making it simple, choosing cost-effective seasonal ingredients, and limiting the use of added fats, salts, and  sugars.  Cooking - Breakfast and Snacks  Clinical staff conducted group or individual video education with verbal and written material and guidebook.  Patient learns how important breakfast is to satiety and nutrition through the entire day. Recommendations include key foods to eat during breakfast to help stabilize blood sugar levels and to prevent overeating at meals later in the day. Planning ahead is also a key component.  Cooking - Human resources officer conducted group or individual video education with verbal and written material and guidebook.  Patient learns eating strategies to improve overall health, including an approach to cook more at home. Recommendations include thinking of animal protein as a side on your plate rather than center stage and focusing instead on lower calorie dense options like vegetables, fruits, whole grains, and plant-based proteins, such as beans. Making sauces in large quantities to freeze for later and leaving the skin on your vegetables are also recommended to maximize your experience.  Cooking - Healthy Salads and Dressing Clinical staff conducted group or individual video education with verbal and written material and guidebook.  Patient learns that vegetables, fruits, whole grains, and legumes are the foundations of the [place]. Recommendations include how to incorporate each of these in flavorful and healthy salads, and how to create homemade salad dressings. Proper handling of ingredients is also covered. Cooking - Soups and [employer] - Soups and Desserts Clinical staff conducted group or individual video education with verbal and written material and guidebook.  Patient learns that Pritikin soups and desserts make for easy, nutritious, and delicious snacks and meal components that are low in sodium, fat, sugar, and calorie density, while high in vitamins, minerals, and filling fiber. Recommendations include simple and healthy  ideas for soups and desserts.   Overview     The Pritikin Solution Program Overview Clinical staff conducted group or individual video education with verbal and written material and guidebook.  Patient learns that the results of the [place] Program have been documented in more than 100 articles published in peer-reviewed journals, and the benefits include reducing risk factors for (and, in some cases, even reversing) high cholesterol, high blood pressure, type 2 diabetes, obesity, and more! An overview of the three key pillars of the Pritikin Program will be covered: eating well, doing regular exercise, and having a healthy mind-set.  WORKSHOPS  Exercise: Exercise Basics: Building Your Action Plan Clinical staff led group instruction and group discussion with PowerPoint presentation and patient guidebook. To enhance the learning environment the use of posters, models and videos may be added. At the conclusion of this workshop, patients will comprehend the difference between physical activity and exercise, as well as the benefits of incorporating both, into their routine. Patients will understand the FITT (Frequency, Intensity, Time, and Type) principle and how to use it to build an exercise action plan. In addition, safety concerns and other considerations for exercise and cardiac rehab will be addressed by the presenter. The purpose of this lesson is to promote a comprehensive and effective weekly exercise routine in order to improve patients' overall level of fitness.  Managing Heart Disease: Your Path to a Healthier Heart Clinical staff led group instruction and group discussion with PowerPoint presentation and patient guidebook. To enhance the learning environment the use of posters, models and videos may be added.At the conclusion of this workshop, patients will understand the anatomy and physiology of the heart. Additionally, they will understand how Pritikin's three pillars impact the  risk factors, the progression, and the management of heart disease.  The purpose of this lesson is to provide a high-level overview of the heart, heart disease, and how the Pritikin lifestyle positively impacts risk factors.  Exercise Biomechanics Clinical staff led group instruction and group discussion with PowerPoint presentation and patient guidebook. To enhance the learning environment the use of posters, models and videos may be added. Patients will learn how the structural parts of their bodies function and how these functions impact their daily activities, movement, and exercise. Patients will learn how to promote a neutral spine, learn how to manage pain, and identify ways to improve their physical movement in order to promote healthy living. The purpose of this lesson is to expose patients to common physical limitations that impact physical activity. Participants will learn practical ways to adapt and manage aches and pains, and to minimize their effect on regular exercise. Patients will learn how to maintain good posture while sitting, walking, and lifting.  Balance Training and Fall Prevention  Clinical staff led group instruction and group discussion with PowerPoint presentation and patient guidebook. To enhance the learning environment the use of posters, models and videos may be added. At the conclusion of this workshop, patients will understand the importance of their sensorimotor skills (vision, proprioception, and the vestibular system) in maintaining their ability to balance as they age. Patients will apply a variety of balancing exercises that are appropriate for their current level of function. Patients will understand the common causes for poor balance, possible solutions to these problems, and ways to modify their physical environment in order to minimize their fall risk. The purpose of this lesson is to teach patients about the importance of maintaining balance as they age  and ways to minimize their risk of falling.  WORKSHOPS   Nutrition:  Fueling a Scientist, research (physical sciences) led group instruction and group discussion with PowerPoint presentation and patient guidebook. To enhance the learning environment the use of posters, models and videos may be added. Patients will review the foundational principles of the [place] and understand what constitutes a serving size in each of the food groups. Patients will also learn Pritikin-friendly foods that are better choices when away from home and review make-ahead meal and snack options. Calorie density will be reviewed and applied to three nutrition priorities: weight maintenance, weight loss, and weight gain. The purpose of this lesson is to reinforce (in a group setting) the key concepts around what patients are recommended to eat and how to apply these guidelines when away from home by planning and selecting Pritikin-friendly options. Patients will understand how calorie density may be adjusted for different weight management goals.  Mindful Eating  Clinical staff led group instruction and group discussion with PowerPoint presentation and patient guidebook. To enhance the learning environment the use of posters, models and videos may be added. Patients will briefly review the concepts of the [place] and the importance of low-calorie dense foods. The concept of mindful eating will be introduced as well as the importance of paying attention to internal hunger signals. Triggers for non-hunger eating and  techniques for dealing with triggers will be explored. The purpose of this lesson is to provide patients with the opportunity to review the basic principles of the [place], discuss the value of eating mindfully and how to measure internal cues of hunger and fullness using the Hunger Scale. Patients will also discuss reasons for non-hunger eating and learn strategies to use for controlling  emotional eating.  Targeting Your Nutrition Priorities Clinical staff led group instruction and group discussion with PowerPoint presentation and patient guidebook. To enhance the learning environment the use of posters, models and videos may be added. Patients will learn how to determine their genetic susceptibility to disease by reviewing their family history. Patients will gain insight into the importance of diet as part of an overall healthy lifestyle in mitigating the impact of genetics and other environmental insults. The purpose of this lesson is to provide patients with the opportunity to assess their personal nutrition priorities by looking at their family history, their own health history and current risk factors. Patients will also be able to discuss ways of prioritizing and modifying the [place] for their highest risk areas  Menu  Clinical staff led group instruction and group discussion with PowerPoint presentation and patient guidebook. To enhance the learning environment the use of posters, models and videos may be added. Using menus brought in from [employer], or printed from [employer], patients will apply the [place] dining out guidelines that were presented in the [employer] video. Patients will also be able to practice these guidelines in a variety of provided scenarios. The purpose of this lesson is to provide patients with the opportunity to practice hands-on learning of the [place] with actual menus and practice scenarios.  Label Reading Clinical staff led group instruction and group discussion with PowerPoint presentation and patient guidebook. To enhance the learning environment the use of posters, models and videos may be added. Patients will review and discuss the Pritikin label reading guidelines presented in Pritikin's Label Reading Educational series video. Using fool labels brought in from local grocery stores  and markets, patients will apply the label reading guidelines and determine if the packaged food meet the Pritikin guidelines. The purpose of this lesson is to provide patients with the opportunity to review, discuss, and practice hands-on learning of the Pritikin Label Reading guidelines with actual packaged food labels. [place] Workshops are designed to teach patients ways to prepare quick, simple, and affordable recipes at home. The importance of nutrition's role in chronic disease risk reduction is reflected in its emphasis in the overall Pritikin program. By learning how to prepare essential core Pritikin Eating Plan recipes, patients will increase control over what they eat; be able to customize the flavor of foods without the use of added salt, sugar, or fat; and improve the quality of the food they consume. By learning a set of core recipes which are easily assembled, quickly prepared, and affordable, patients are more likely to prepare more healthy foods at home. These workshops focus on convenient breakfasts, simple entres, side dishes, and desserts which can be prepared with minimal effort and are consistent with nutrition recommendations for cardiovascular risk reduction. Cooking [employer] are taught by a Engineer, materials (RD) who has been trained by the [employer]. The chef or RD has a clear understanding of the importance of minimizing - if not completely eliminating - added fat, sugar, and sodium in  recipes. Throughout the series of [place] Workshop sessions, patients will learn about healthy ingredients and efficient methods of cooking to build confidence in their capability to prepare    Cooking School weekly topics:  Adding Flavor- Sodium-Free  Fast and Healthy Breakfasts  Powerhouse Plant-Based Proteins  Satisfying Salads and Dressings  Simple Sides and Sauces  International Cuisine-Spotlight on the [place]  Zones  Delicious Desserts  Savory Soups  [employer] - Meals in a Agricultural consultant Appetizers and Snacks  Comforting Weekend Breakfasts  One-Pot Wonders   Fast Evening Meals  Contractor Your Pritikin Plate  WORKSHOPS   Healthy Mindset (Psychosocial):  Focused Goals, Sustainable Changes Clinical staff led group instruction and group discussion with PowerPoint presentation and patient guidebook. To enhance the learning environment the use of posters, models and videos may be added. Patients will be able to apply effective goal setting strategies to establish at least one personal goal, and then take consistent, meaningful action toward that goal. They will learn to identify common barriers to achieving personal goals and develop strategies to overcome them. Patients will also gain an understanding of how our mind-set can impact our ability to achieve goals and the importance of cultivating a positive and growth-oriented mind-set. The purpose of this lesson is to provide patients with a deeper understanding of how to set and achieve personal goals, as well as the tools and strategies needed to overcome common obstacles which may arise along the way.  From Head to Heart: The Power of a Healthy Outlook  Clinical staff led group instruction and group discussion with PowerPoint presentation and patient guidebook. To enhance the learning environment the use of posters, models and videos may be added. Patients will be able to recognize and describe the impact of emotions and mood on physical health. They will discover the importance of self-care and explore self-care practices which may work for them. Patients will also learn how to utilize the 4 C's to cultivate a healthier outlook and better manage stress and challenges. The purpose of this lesson is to demonstrate to patients how a healthy outlook is an essential part of maintaining good health, especially as they continue their  cardiac rehab journey.  Healthy Sleep for a Healthy Heart Clinical staff led group instruction and group discussion with PowerPoint presentation and patient guidebook. To enhance the learning environment the use of posters, models and videos may be added. At the conclusion of this workshop, patients will be able to demonstrate knowledge of the importance of sleep to overall health, well-being, and quality of life. They will understand the symptoms of, and treatments for, common sleep disorders. Patients will also be able to identify daytime and nighttime behaviors which impact sleep, and they will be able to apply these tools to help manage sleep-related challenges. The purpose of this lesson is to provide patients with a general overview of sleep and outline the importance of quality sleep. Patients will learn about a few of the most common sleep disorders. Patients will also be introduced to the concept of "sleep hygiene," and discover ways to self-manage certain sleeping problems through simple daily behavior changes. Finally, the workshop will motivate patients by clarifying the links between quality sleep and their goals of heart-healthy living.   Recognizing and Reducing Stress Clinical staff led group instruction and group discussion with PowerPoint presentation and patient guidebook. To enhance the learning environment the use of posters, models and videos may be added. At the conclusion of this  workshop, patients will be able to understand the types of stress reactions, differentiate between acute and chronic stress, and recognize the impact that chronic stress has on their health. They will also be able to apply different coping mechanisms, such as reframing negative self-talk. Patients will have the opportunity to practice a variety of stress management techniques, such as deep abdominal breathing, progressive muscle relaxation, and/or guided imagery.  The purpose of this lesson is to educate  patients on the role of stress in their lives and to provide healthy techniques for coping with it.  Learning Barriers/Preferences:  Learning Barriers/Preferences - 01/09/23 1439       Learning Barriers/Preferences   Learning Barriers None    Learning Preferences Written Material;Computer/Internet;Skilled Demonstration;Individual Instruction;Group Instruction             Education Topics:  Knowledge Questionnaire Score:  Knowledge Questionnaire Score - 01/09/23 1439       Knowledge Questionnaire Score   Pre Score 22/24             Core Components/Risk Factors/Patient Goals at Admission:  Personal Goals and Risk Factors at Admission - 01/09/23 1525       Core Components/Risk Factors/Patient Goals on Admission    Weight Management Yes;Weight Loss    Intervention Weight Management: Develop a combined nutrition and exercise program designed to reach desired caloric intake, while maintaining appropriate intake of nutrient and fiber, sodium and fats, and appropriate energy expenditure required for the weight goal.;Weight Management: Provide education and appropriate resources to help participant work on and attain dietary goals.;Weight Management/Obesity: Establish reasonable short term and long term weight goals.;Obesity: Provide education and appropriate resources to help participant work on and attain dietary goals.    Admit Weight 240 lb 4.8 oz (109 kg)    Expected Outcomes Short Term: Continue to assess and modify interventions until short term weight is achieved;Long Term: Adherence to nutrition and physical activity/exercise program aimed toward attainment of established weight goal;Weight Loss: Understanding of general recommendations for a balanced deficit meal plan, which promotes 1-2 lb weight loss per week and includes a negative energy balance of [phone] kcal/d;Understanding recommendations for meals to include 15-35% energy as protein, 25-35% energy from fat, 35-60%  energy from carbohydrates, less than 200mg  of dietary cholesterol, 20-35 gm of total fiber daily;Understanding of distribution of calorie intake throughout the day with the consumption of 4-5 meals/snacks    Hypertension Yes    Intervention Provide education on lifestyle modifcations including regular physical activity/exercise, weight management, moderate sodium restriction and increased consumption of fresh fruit, vegetables, and low fat dairy, alcohol moderation, and smoking cessation.;Monitor prescription use compliance.    Expected Outcomes Short Term: Continued assessment and intervention until BP is < 140/62mm HG in hypertensive participants. < 130/34mm HG in hypertensive participants with diabetes, heart failure or chronic kidney disease.;Long Term: Maintenance of blood pressure at goal levels.             Core Components/Risk Factors/Patient Goals Review:   Goals and Risk Factor Review     Row Name 01/17/23 1418 01/24/23 0925           Core Components/Risk Factors/Patient Goals Review   Personal Goals Review Weight Management/Obesity;Hypertension Weight Management/Obesity;Hypertension      Review Shaquetta started intensive cardiac rehab on 01/17/23 and did well with exercise. Vital signs stable Oneyda started intensive cardiac rehab on 01/17/23 and is off to a good start to exercise. Vital signs have been stable. Jaqueline's weight was up on Monday.  Weight today 110.3 kg. which is down. Jaqueline did notify the transplant coordinator. No complaints or symptoms.      Expected Outcomes Jaqueline will continue to participate in intensive cardiac rehab for exercise, nutrtion and lifestyle modifications Jaqueline will continue to participate in intensive cardiac rehab for exercise, nutrtion and lifestyle modifications               Core Components/Risk Factors/Patient Goals at Discharge (Final Review):   Goals and Risk Factor Review - 01/24/23 0925       Core  Components/Risk Factors/Patient Goals Review   Personal Goals Review Weight Management/Obesity;Hypertension    Review Mallie started intensive cardiac rehab on 01/17/23 and is off to a good start to exercise. Vital signs have been stable. Jaqueline's weight was up on Monday. Weight today 110.3 kg. which is down. Jaqueline did notify the transplant coordinator. No complaints or symptoms.    Expected Outcomes Jaqueline will continue to participate in intensive cardiac rehab for exercise, nutrtion and lifestyle modifications             ITP Comments:  ITP Comments     Row Name 01/09/23 1359 01/17/23 1412 01/24/23 0919       ITP Comments Fransico Him, MD - Medical Director. Introduction to the [facility]/Intensive Cardiac Rehab. Initial orientation packet reviewed with the patient 30 Day ITP Review. Jaqueline started intensive cardiac rehab on 01/17/23 and did well with exercise 30 Day ITP Review. Jaqueline started intensive cardiac rehab on 01/17/23 and is off to a good start to exercise              Comments: See ITP comments.Harrell Gave RN BSN

## 2023-01-26 ENCOUNTER — Encounter (HOSPITAL_COMMUNITY)
Admission: RE | Admit: 2023-01-26 | Discharge: 2023-01-26 | Disposition: A | Discharge status: Home / Self Care | Payer: BC Managed Care – PPO | Source: Ambulatory Visit | Attending: Cardiology | Admitting: Cardiology

## 2023-01-26 DIAGNOSIS — Z941 Heart transplant status: Secondary | ICD-10-CM | POA: Diagnosis not present

## 2023-01-26 DIAGNOSIS — I5022 Chronic systolic (congestive) heart failure: Secondary | ICD-10-CM

## 2023-01-29 ENCOUNTER — Encounter (HOSPITAL_COMMUNITY)
Admission: RE | Admit: 2023-01-29 | Discharge: 2023-01-29 | Disposition: A | Discharge status: Home / Self Care | Payer: BC Managed Care – PPO | Source: Ambulatory Visit | Attending: Cardiology | Admitting: Cardiology

## 2023-01-29 DIAGNOSIS — Z941 Heart transplant status: Secondary | ICD-10-CM

## 2023-01-31 ENCOUNTER — Encounter (HOSPITAL_COMMUNITY)
Admission: RE | Admit: 2023-01-31 | Discharge: 2023-01-31 | Disposition: A | Discharge status: Home / Self Care | Payer: BC Managed Care – PPO | Source: Ambulatory Visit | Attending: Cardiology | Admitting: Cardiology

## 2023-01-31 DIAGNOSIS — I5022 Chronic systolic (congestive) heart failure: Secondary | ICD-10-CM

## 2023-01-31 DIAGNOSIS — Z941 Heart transplant status: Secondary | ICD-10-CM | POA: Diagnosis not present

## 2023-02-02 ENCOUNTER — Encounter (HOSPITAL_COMMUNITY)
Admission: RE | Admit: 2023-02-02 | Discharge: 2023-02-02 | Disposition: A | Discharge status: Home / Self Care | Payer: BC Managed Care – PPO | Source: Ambulatory Visit | Attending: Cardiology | Admitting: Cardiology

## 2023-02-02 DIAGNOSIS — Z941 Heart transplant status: Secondary | ICD-10-CM | POA: Diagnosis not present

## 2023-02-05 ENCOUNTER — Encounter (HOSPITAL_COMMUNITY)
Admission: RE | Admit: 2023-02-05 | Discharge: 2023-02-05 | Disposition: A | Discharge status: Home / Self Care | Payer: BC Managed Care – PPO | Source: Ambulatory Visit | Attending: Cardiology | Admitting: Cardiology

## 2023-02-05 DIAGNOSIS — Z941 Heart transplant status: Secondary | ICD-10-CM

## 2023-02-05 DIAGNOSIS — I5022 Chronic systolic (congestive) heart failure: Secondary | ICD-10-CM | POA: Diagnosis present

## 2023-02-07 ENCOUNTER — Encounter (HOSPITAL_COMMUNITY): Payer: BC Managed Care – PPO

## 2023-02-09 ENCOUNTER — Encounter (HOSPITAL_COMMUNITY): Payer: BC Managed Care – PPO

## 2023-02-12 ENCOUNTER — Encounter (HOSPITAL_COMMUNITY): Payer: BC Managed Care – PPO

## 2023-02-14 ENCOUNTER — Encounter (HOSPITAL_COMMUNITY)
Admission: RE | Admit: 2023-02-14 | Discharge: 2023-02-14 | Disposition: A | Discharge status: Home / Self Care | Payer: BC Managed Care – PPO | Source: Ambulatory Visit | Attending: Cardiology | Admitting: Cardiology

## 2023-02-14 DIAGNOSIS — Z941 Heart transplant status: Secondary | ICD-10-CM | POA: Diagnosis not present

## 2023-02-14 DIAGNOSIS — I5022 Chronic systolic (congestive) heart failure: Secondary | ICD-10-CM

## 2023-02-15 NOTE — Progress Notes (Signed)
Cardiac Individual Treatment Plan  Patient Details  Name: [name] MRN: [id number] Date of Birth: [birth year] Referring Provider:   Flowsheet Row INTENSIVE CARDIAC REHAB ORIENT from 01/09/2023 in [facility], [facility]  Referring Provider Dr. Edwena Blow (Armanda Magic, MD covering)       Initial Encounter Date:  Flowsheet Row INTENSIVE CARDIAC REHAB ORIENT from 01/09/2023 in [facility] for Heart, Vascular, & Lung [facility]  Date 01/09/23       Visit Diagnosis: 10/31/22 Heart transplant at [facility]  Patient's Home Medications on Admission:  Current Outpatient Medications:    acetaminophen (TYLENOL) 325 MG tablet, Take 650 mg by mouth every 6 (six) hours as needed., Disp: , Rfl:    ASPIRIN ADULT LOW DOSE 81 MG tablet, Take 81 mg by mouth daily., Disp: , Rfl:    buPROPion (WELLBUTRIN SR) 150 MG 12 hr tablet, Take 150 mg by mouth daily., Disp: , Rfl:    Calcium Carb-Cholecalciferol 600-10 MG-MCG TABS, Take 600 mcg by mouth in the morning and at bedtime., Disp: , Rfl:    diazepam (VALIUM) 5 MG tablet, Take 5 mg by mouth as needed. Prior to biopsy, Disp: , Rfl:    ELIQUIS 5 MG TABS tablet, Take 5 mg by mouth 2 (two) times daily., Disp: , Rfl:    fluconazole (DIFLUCAN) 150 MG tablet, Take 1 tablet (150 mg total) by mouth as needed. (Patient not taking: Reported on 01/09/2023), Disp: 30 tablet, Rfl: 0   furosemide (LASIX) 40 MG tablet, Take 1 tablet (40 mg total) by mouth daily., Disp: 30 tablet, Rfl: 6   gabapentin (NEURONTIN) 400 MG capsule, Take 400 mg by mouth 3 (three) times daily., Disp: , Rfl:    hydrOXYzine (VISTARIL) 25 MG capsule, Take 25 mg by mouth 2 (two) times daily as needed for anxiety., Disp: , Rfl:    ketoconazole (NIZORAL) 2 % cream, Apply 1 Application topically daily as needed for irritation., Disp: , Rfl:    ketoconazole (NIZORAL) 2 % shampoo, Apply 1 Application topically 2 (two) times a week. As needed,  Disp: , Rfl:    methocarbamol (ROBAXIN) 500 MG tablet, Take 500 mg by mouth 3 (three) times daily as needed for muscle spasms., Disp: , Rfl:    mycophenolate (CELLCEPT) 250 MG capsule, Take 1,000 mg by mouth 2 (two) times daily., Disp: , Rfl:    nystatin (MYCOSTATIN) 100000 UNIT/ML suspension, Take 5 mLs by mouth 4 (four) times daily., Disp: , Rfl:    pantoprazole (PROTONIX) 40 MG tablet, Take 40 mg by mouth daily., Disp: , Rfl:    pravastatin (PRAVACHOL) 40 MG tablet, Take 40 mg by mouth daily., Disp: , Rfl:    predniSONE (DELTASONE) 5 MG tablet, Take 10 mg by mouth daily with breakfast. Take as directed, Disp: , Rfl:    sertraline (ZOLOFT) 100 MG tablet, Take 100 mg by mouth daily., Disp: , Rfl:    sulfamethoxazole-trimethoprim (BACTRIM) 400-80 MG tablet, Take 1 tablet by mouth daily., Disp: , Rfl:    tacrolimus (PROGRAF) 1 MG capsule, Take 1 mg by mouth 2 (two) times daily. 5 mg at 0900 4 mg at 2100, Disp: , Rfl:    valACYclovir (VALTREX) 1000 MG tablet, Take 1,000 mg by mouth as needed for rash. (Patient not taking: Reported on 01/09/2023), Disp: , Rfl:    valGANciclovir (VALCYTE) 450 MG tablet, Take 900 mg by mouth daily., Disp: , Rfl:   Past Medical History: Past Medical  History:  Diagnosis Date   Lactose intolerance    NICM (nonischemic cardiomyopathy) (HCC)    PVC's (premature ventricular contractions)    Torsades de pointes (HCC)     Tobacco Use: Social History   Tobacco Use  Smoking Status Never  Smokeless Tobacco Never    Labs: Review Flowsheet       Latest Ref Rng & Units 09/01/2022  Labs for ITP Cardiac and Pulmonary Rehab  Bicarbonate 20.0 - 28.0 mmol/L 21.9  19.0  21.9   TCO2 22 - 32 mmol/L 23  20  23    Acid-base deficit 0.0 - 2.0 mmol/L 3.0  5.0  3.0   O2 Saturation % 68  66  66     Capillary Blood Glucose: No results found for: "GLUCAP"   Exercise Target Goals: Exercise Program Goal: Individual exercise prescription set using results from initial 6 min  walk test and THRR while considering  patient's activity barriers and safety.   Exercise Prescription Goal: Initial exercise prescription builds to 30-45 minutes a day of aerobic activity, 2-3 days per week.  Home exercise guidelines will be given to patient during program as part of exercise prescription that the participant will acknowledge.  Activity Barriers & Risk Stratification:  Activity Barriers & Cardiac Risk Stratification - 01/09/23 1429       Activity Barriers & Cardiac Risk Stratification   Activity Barriers Muscular Weakness;Deconditioning;Back Problems    Cardiac Risk Stratification High             6 Minute Walk:  6 Minute Walk     Row Name 01/09/23 1209         6 Minute Walk   Phase Initial     Distance 1650 feet     Walk Time 6 minutes     # of Rest Breaks 0     MPH 3.13     METS 5.81     RPE 12     Perceived Dyspnea  0     VO2 Peak 20.3     Symptoms Yes (comment)     Comments Bilateral calf tightness     Resting HR 88 bpm     Resting BP 130/90     Resting Oxygen Saturation  98 %     Exercise Oxygen Saturation  during 6 min walk 96 %     Max Ex. HR 111 bpm     Max Ex. BP 164/88     2 Minute Post BP 126/80              Oxygen Initial Assessment:   Oxygen Re-Evaluation:   Oxygen Discharge (Final Oxygen Re-Evaluation):   Initial Exercise Prescription:  Initial Exercise Prescription - 01/09/23 1400       Date of Initial Exercise RX and Referring Provider   Date 01/09/23    Referring Provider Dr. Edwena Blow Armanda Magic, MD covering)    Expected Discharge Date 03/23/23      Arm Ergometer   Level 1.5    Watts 25    RPM 60    Minutes 15    METs 5.8      Recumbant Elliptical   Level 2    RPM 60    Watts 90    Minutes 15    METs 5.8      Prescription Details   Frequency (times per week) 3    Duration Progress to 30 minutes of continuous aerobic without signs/symptoms of physical distress      Intensity  THRR 40-80% of Max  Heartrate 78-156    Ratings of Perceived Exertion 11-13    Perceived Dyspnea 0-4      Progression   Progression Continue progressive overload as per policy without signs/symptoms or physical distress.      Resistance Training   Training Prescription Yes    Weight 3 lbs    Reps 10-15             Perform Capillary Blood Glucose checks as needed.  Exercise Prescription Changes:   Exercise Prescription Changes     Row Name 01/17/23 1200 02/02/23 1100 02/14/23 1040         Response to Exercise   Blood Pressure (Admit) 124/80 128/88 108/78     Blood Pressure (Exercise) 138/88 136/74 148/76     Blood Pressure (Exit) 110/80 108/70 120/80     Heart Rate (Admit) 92 bpm 102 bpm 101 bpm     Heart Rate (Exercise) 117 bpm 116 bpm 114 bpm     Heart Rate (Exit) 101 bpm 103 bpm 106 bpm     Rating of Perceived Exertion (Exercise) 13 12 14      Symptoms None None None     Comments Pt's first day in the CRP2 program Reviewed METs Reviewed METs and Goals     Duration Continue with 30 min of aerobic exercise without signs/symptoms of physical distress. Continue with 30 min of aerobic exercise without signs/symptoms of physical distress. Continue with 30 min of aerobic exercise without signs/symptoms of physical distress.     Intensity THRR unchanged THRR unchanged THRR unchanged       Progression   Progression Continue to progress workloads to maintain intensity without signs/symptoms of physical distress. Continue to progress workloads to maintain intensity without signs/symptoms of physical distress. Continue to progress workloads to maintain intensity without signs/symptoms of physical distress.     Average METs 2.45 3.1 3.3       Resistance Training   Training Prescription No Yes No     Weight No weights on Wednesdays 3 lbs No weights on wednesday     Reps -- 10-15 --     Time -- 10 Minutes --       Interval Training   Interval Training No No No       Arm Ergometer   Level 1.5 1.5  1.5     Watts -- -- 19     RPM 6 -- 71     Minutes 15 15 15      METs 1.9 2.1 2.2       Recumbant Elliptical   Level 2 2 2      RPM 64 -- 77     Watts 86 121 122     Minutes 15 15 15      METs 3 4.1 4.1              Exercise Comments:   Exercise Comments     Row Name 01/17/23 1218 02/02/23 1417 02/14/23 1050       Exercise Comments Pt's first day in the cRP2 program. Pt exercised with no complaints. Reviewed METs with pt today. Pt is tolerating exercise well with an average MET level of 3.1. Pt continues to make great progress within the program and is happy with her progression. Will continue to monitor and progress workloads as tolerated without s/sx. Reviewed METs and goals. Pt feels postive about her progress and is enjoying the program and education.  Exercise Goals and Review:   Exercise Goals     Row Name 01/09/23 1430             Exercise Goals   Increase Physical Activity Yes       Intervention Provide advice, education, support and counseling about physical activity/exercise needs.;Develop an individualized exercise prescription for aerobic and resistive training based on initial evaluation findings, risk stratification, comorbidities and participant's personal goals.       Expected Outcomes Short Term: Attend rehab on a regular basis to increase amount of physical activity.;Long Term: Add in home exercise to make exercise part of routine and to increase amount of physical activity.;Long Term: Exercising regularly at least 3-5 days a week.       Increase Strength and Stamina Yes       Intervention Provide advice, education, support and counseling about physical activity/exercise needs.;Develop an individualized exercise prescription for aerobic and resistive training based on initial evaluation findings, risk stratification, comorbidities and participant's personal goals.       Expected Outcomes Short Term: Increase workloads from initial exercise  prescription for resistance, speed, and METs.;Short Term: Perform resistance training exercises routinely during rehab and add in resistance training at home;Long Term: Improve cardiorespiratory fitness, muscular endurance and strength as measured by increased METs and functional capacity ( )       Able to understand and use rate of perceived exertion (RPE) scale Yes       Intervention Provide education and explanation on how to use RPE scale       Expected Outcomes Short Term: Able to use RPE daily in rehab to express subjective intensity level;Long Term:  Able to use RPE to guide intensity level when exercising independently       Knowledge and understanding of Target Heart Rate Range (THRR) Yes       Intervention Provide education and explanation of THRR including how the numbers were predicted and where they are located for reference       Expected Outcomes Short Term: Able to state/look up THRR;Long Term: Able to use THRR to govern intensity when exercising independently;Short Term: Able to use daily as guideline for intensity in rehab       Understanding of Exercise Prescription Yes       Intervention Provide education, explanation, and written materials on patient's individual exercise prescription       Expected Outcomes Short Term: Able to explain program exercise prescription;Long Term: Able to explain home exercise prescription to exercise independently                Exercise Goals Re-Evaluation :  Exercise Goals Re-Evaluation     Row Name 01/17/23 1217 02/14/23 1050           Exercise Goal Re-Evaluation   Exercise Goals Review Increase Physical Activity;Increase Strength and Stamina;Able to understand and use rate of perceived exertion (RPE) scale;Knowledge and understanding of Target Heart Rate Range (THRR);Understanding of Exercise Prescription Increase Physical Activity;Increase Strength and Stamina;Able to understand and use rate of perceived exertion (RPE)  scale;Knowledge and understanding of Target Heart Rate Range (THRR);Understanding of Exercise Prescription      Comments Pt's first day in the CRP2 program. Pt understnads the exercise Rx, RPE scale and THRR. Reviewed METs and goals. Pt is making progress. Pt voices that she has seen imrpove in her strength and stamina as well as her upperbody strength, both patient goals. Pt also has been able to walk 2 miles which is also a  patient goal.      Expected Outcomes Will continue to montior patient and progress exercise workloads as tolerated. Will continue to montior patient and progress exercise workloads as tolerated.               Discharge Exercise Prescription (Final Exercise Prescription Changes):  Exercise Prescription Changes - 02/14/23 1040       Response to Exercise   Blood Pressure (Admit) 108/78    Blood Pressure (Exercise) 148/76    Blood Pressure (Exit) 120/80    Heart Rate (Admit) 101 bpm    Heart Rate (Exercise) 114 bpm    Heart Rate (Exit) 106 bpm    Rating of Perceived Exertion (Exercise) 14    Symptoms None    Comments Reviewed METs and Goals    Duration Continue with 30 min of aerobic exercise without signs/symptoms of physical distress.    Intensity THRR unchanged      Progression   Progression Continue to progress workloads to maintain intensity without signs/symptoms of physical distress.    Average METs 3.3      Resistance Training   Training Prescription No    Weight No weights on wednesday      Interval Training   Interval Training No      Arm Ergometer   Level 1.5    Watts 19    RPM 71    Minutes 15    METs 2.2      Recumbant Elliptical   Level 2    RPM 77    Watts 122    Minutes 15    METs 4.1             Nutrition:  Target Goals: Understanding of nutrition guidelines, daily intake of sodium 1500mg , cholesterol 200mg , calories 30% from fat and 7% or less from saturated fats, daily to have 5 or more servings of fruits and  vegetables.  Biometrics:  Pre Biometrics - 01/09/23 1042       Pre Biometrics   Waist Circumference 42.5 inches    Hip Circumference 49 inches    Waist to Hip Ratio 0.87 %    Triceps Skinfold 47 mm    % Body Fat 46.4 %    Grip Strength 33 kg    Flexibility 18 in    Single Leg Stand 30 seconds              Nutrition Therapy Plan and Nutrition Goals:  Nutrition Therapy & Goals - 02/14/23 1108       Nutrition Therapy   Diet Heart Healthy Diet    Drug/Food Interactions Statins/Certain Fruits      Personal Nutrition Goals   Nutrition Goal Patient to identify strategies for reducing cardiovascular risk by attending the [employer] education and nutrition series weekly.    Personal Goal #2 Patient to improve diet quality by using the plate method as a guide for meal planning to include lean protein/plant protein, fruits, vegetables, whole grains, nonfat dairy as part of a well-balanced diet.    Personal Goal #3 Patient to identify strategies for weight loss of 0.5-2.0# per week.    Comments Goals in progress.Shina continues to attend the [employer] education and nutrition series regularly. Liller reports weight gain of ~80# over the last year and she is motivated to lose weight. We have discussed multiple strategies for weight loss including benefits of high protein intake, reducing carbohydrate/fat intake, mindful eating, protein supplements, tracking intake, etc. She plans to discuss GLP-1s with her cardiologist.  Per transplant notes, she will begin to wean off prednisone. 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 Macaila will benefit from participation in intensive cardiac rehab for nutrition, exercise, and lifestyle modification.      Intervention Plan   Intervention Prescribe, educate and counsel regarding individualized specific dietary modifications aiming towards targeted core components such as weight, hypertension, lipid management, diabetes,  heart failure and other comorbidities.;Nutrition handout(s) given to patient.    Expected Outcomes Short Term Goal: Understand basic principles of dietary content, such as calories, fat, sodium, cholesterol and nutrients.;Long Term Goal: Adherence to prescribed nutrition plan.             Nutrition Assessments:  Nutrition Assessments - 01/30/23 1145       Rate Your Plate Scores   Pre Score 69            MEDIFICTS Score Key: ?70 Need to make dietary changes  40-70 Heart Healthy Diet ? 40 Therapeutic Level Cholesterol Diet   Flowsheet Row INTENSIVE CARDIAC REHAB from 01/29/2023 in [facility], [facility]  Picture Your Plate Total Score on Admission 69      Picture Your Plate Scores: <72 Unhealthy dietary pattern with much room for improvement. 41-50 Dietary pattern unlikely to meet recommendations for good health and room for improvement. 51-60 More healthful dietary pattern, with some room for improvement.  >60 Healthy dietary pattern, although there may be some specific behaviors that could be improved.    Nutrition Goals Re-Evaluation:  Nutrition Goals Re-Evaluation     Row Name 01/17/23 1001 02/14/23 1108           Goals   Current Weight 242 lb 8.1 oz (110 kg) 244 lb 14.9 oz (111.1 kg)      Comment labs WNL labs WNL      Expected Outcome Lekeesha reports weight gain of ~80# over the last year and she is motivated to lose weight. Per transplant notes, she will begin to wean off prednisone. 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 Freda will benefit from participation in intensive cardiac rehab for nutrition, exercise, and lifestyle modification. Goals in progress.Kalianne continues to attend the [employer] education and nutrition series regularly. Roxene reports weight gain of ~80# over the last year and she is motivated to lose weight. We have discussed multiple strategies for weight  loss including benefits of high protein intake, reducing carbohydrate/fat intake, mindful eating, protein supplements, tracking intake, etc. She plans to discuss GLP-1s with her cardiologist. Per transplant notes, she will begin to wean off prednisone. 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 Rawan will benefit from participation in intensive cardiac rehab for nutrition, exercise, and lifestyle modification.               Nutrition Goals Re-Evaluation:  Nutrition Goals Re-Evaluation     Row Name 01/17/23 1001 02/14/23 1108           Goals   Current Weight 242 lb 8.1 oz (110 kg) 244 lb 14.9 oz (111.1 kg)      Comment labs WNL labs WNL      Expected Outcome Yleana reports weight gain of ~80# over the last year and she is motivated to lose weight. Per transplant notes, she will begin to wean off prednisone. 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 Johni will benefit from participation in intensive cardiac rehab for nutrition, exercise, and lifestyle modification.  Goals in progress.Penina continues to attend the [employer] education and nutrition series regularly. Doll reports weight gain of ~80# over the last year and she is motivated to lose weight. We have discussed multiple strategies for weight loss including benefits of high protein intake, reducing carbohydrate/fat intake, mindful eating, protein supplements, tracking intake, etc. She plans to discuss GLP-1s with her cardiologist. Per transplant notes, she will begin to wean off prednisone. 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 Garry will benefit from participation in intensive cardiac rehab for nutrition, exercise, and lifestyle modification.               Nutrition Goals Discharge (Final Nutrition Goals Re-Evaluation):  Nutrition Goals Re-Evaluation - 02/14/23 1108       Goals   Current Weight 244 lb 14.9 oz (111.1 kg)     Comment labs WNL    Expected Outcome Goals in progress.Ferne continues to attend the [employer] education and nutrition series regularly. Alexanderia reports weight gain of ~80# over the last year and she is motivated to lose weight. We have discussed multiple strategies for weight loss including benefits of high protein intake, reducing carbohydrate/fat intake, mindful eating, protein supplements, tracking intake, etc. She plans to discuss GLP-1s with her cardiologist. Per transplant notes, she will begin to wean off prednisone. 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 Kachina will benefit from participation in intensive cardiac rehab for nutrition, exercise, and lifestyle modification.             Psychosocial: Target Goals: Acknowledge presence or absence of significant depression and/or stress, maximize coping skills, provide positive support system. Participant is able to verbalize types and ability to use techniques and skills needed for reducing stress and depression.  Initial Review & Psychosocial Screening:  Initial Psych Review & Screening - 01/09/23 1448       Initial Review   Current issues with --   Pt is taking wellbutrin and Zoloft. The pt has hx of generalized anxiety. No voiced issues with depression or stress. PHQ -9 =1.     Family Dynamics   Good Support System? Yes    Comments Has mother for support      Barriers   Psychosocial barriers to participate in program There are no identifiable barriers or psychosocial needs.      Screening Interventions   Interventions Encouraged to exercise             Quality of Life Scores:  Quality of Life - 01/09/23 1437       Quality of Life   Select Quality of Life      Quality of Life Scores   Health/Function Pre 24.75 %    Socioeconomic Pre 21.36 %    Socioeconomic Post --    Socioeconomic % Change  --    Psych/Spiritual Pre 24 %    Family Pre 27.88 %    GLOBAL Pre 24.23 %             Scores of 19 and below usually indicate a poorer quality of life in these areas.  A difference of  2-3 points is a clinically meaningful difference.  A difference of 2-3 points in the total score of the Quality of Life Index has been associated with significant improvement in overall quality of life, self-image, physical symptoms, and general health in studies assessing change in quality of life.  PHQ-9: Review Flowsheet       01/09/2023 06/22/2021 03/30/2021  Depression  screen PHQ 2/9  Decreased Interest 0 0 1  Down, Depressed, Hopeless 0 1 1  PHQ - 2 Score 0 1 2  Altered sleeping 0 0 0  Tired, decreased energy 0 0 1  Change in appetite 1 0 1  Feeling bad or failure about yourself  0 0 0  Trouble concentrating 0 0 0  Moving slowly or fidgety/restless 0 0 0  Suicidal thoughts 0 0 0  PHQ-9 Score 1 1 4   Difficult doing work/chores Not difficult at all Not difficult at all Somewhat difficult   Interpretation of Total Score  Total Score Depression Severity:  1-4 = Minimal depression, 5-9 = Mild depression, 10-14 = Moderate depression, 15-19 = Moderately severe depression, 20-27 = Severe depression   Psychosocial Evaluation and Intervention:   Psychosocial Re-Evaluation:  Psychosocial Re-Evaluation     Row Name 01/17/23 1418 01/24/23 0920 02/15/23 0934         Psychosocial Re-Evaluation   Current issues with None Identified None Identified None Identified     Comments -- -- No psychosocial needs identified     Interventions Encouraged to attend Cardiac Rehabilitation for the exercise Encouraged to attend Cardiac Rehabilitation for the exercise Encouraged to attend Cardiac Rehabilitation for the exercise     Continue Psychosocial Services  No Follow up required No Follow up required No Follow up required              Psychosocial Discharge (Final Psychosocial Re-Evaluation):  Psychosocial Re-Evaluation - 02/15/23 0934       Psychosocial Re-Evaluation    Current issues with None Identified    Comments No psychosocial needs identified    Interventions Encouraged to attend Cardiac Rehabilitation for the exercise    Continue Psychosocial Services  No Follow up required             Vocational Rehabilitation: Provide vocational rehab assistance to qualifying candidates.   Vocational Rehab Evaluation & Intervention:  Vocational Rehab - 01/09/23 1440       Initial Vocational Rehab Evaluation & Intervention   Assessment shows need for Vocational Rehabilitation No      Vocational Rehab Re-Evaulation   Comments Pt is a registered dietican. Pt was not at her job long enough to qualify for FMLA benefits. 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             Education: Education Goals: Education classes will be provided on a weekly basis, covering required topics. Participant will state understanding/return demonstration of topics presented.    Education     Row Name 01/17/23 1000     Education   Cardiac Education Topics [employer]   Secondary school teacher School   Educator Dietitian   Weekly Topic Fast and Healthy Breakfasts   Instruction Review Code 1- Verbalizes Understanding   Class Start Time 0813   Class Stop Time 0856   Class Time Calculation (min) 43 min    Row Name 01/19/23 0900     Education   Cardiac Education Topics Pritikin   Select Core Videos     Core Videos   Educator Dietitian   Select Nutrition   Instruction Review Code 1- Verbalizes Understanding   Class Start Time 0815   Class Stop Time 0905   Class Time Calculation (min) 50 min    Row Name 01/22/23 0900     Education   Cardiac Education Topics Pritikin   [employer]  Educator Exercise Physiologist   Select Psychosocial   Psychosocial Workshop Recognizing and Reducing Stress   Instruction Review Code 1- Verbalizes Understanding   Class Start Time [phone]   Class Stop Time 0853   Class Time Calculation  (min) 41 min    Row Name 01/24/23 1000     Education   Cardiac Education Topics Pritikin   Secondary school teacher School   Educator Dietitian   Weekly Topic Personalizing Your Pritikin Plate   Instruction Review Code 1- Verbalizes Understanding   Class Start Time [phone]   Class Stop Time 0848   Class Time Calculation (min) 38 min    Row Name 01/26/23 1100     Education   Cardiac Education Topics Pritikin   Nurse, children's   Educator Exercise Physiologist   Select Psychosocial   Psychosocial Healthy Minds, Bodies, Hearts   Instruction Review Code 1- Verbalizes Understanding   Class Start Time [phone]   Class Stop Time 0853   Class Time Calculation (min) 36 min    Row Name 01/29/23 1000     Education   Cardiac Education Topics Pritikin   Glass blower/designer Nutrition   Nutrition Workshop Label Reading   Instruction Review Code 1- Verbalizes Understanding   Class Start Time 0815   Class Stop Time 0905   Class Time Calculation (min) 50 min    Row Name 01/31/23 1100     Education   Cardiac Education Topics Pritikin   Orthoptist   Educator Dietitian   Weekly Topic [employer] Desserts   Instruction Review Code 1- Verbalizes Understanding   Class Start Time 0815   Class Stop Time 0856   Class Time Calculation (min) 41 min    Row Name 02/02/23 0900     Education   Cardiac Education Topics Pritikin   Select Core Videos     Core Videos   Educator Dietitian   Select Nutrition   Nutrition Other   Instruction Review Code 1- Verbalizes Understanding   Class Start Time 0815   Class Stop Time 0900   Class Time Calculation (min) 45 min    Row Name 02/05/23 0900     Education   Cardiac Education Topics Pritikin   Select Workshops     Workshops   Educator Exercise Physiologist   Select Exercise   Exercise Workshop Exercise Basics: Building Your Action Plan    Instruction Review Code 1- Verbalizes Understanding   Class Start Time 0815   Class Stop Time 0903   Class Time Calculation (min) 48 min    Row Name 02/14/23 0900     Education   Cardiac Education Topics Pritikin   Orthoptist   Educator Dietitian   Weekly Topic Efficiency Cooking - Meals in a Snap   Instruction Review Code 1- Verbalizes Understanding   Class Start Time 0815   Class Stop Time 0849   Class Time Calculation (min) 34 min            Core Videos: Exercise    Move It!  Clinical staff conducted group or individual video education with verbal and written material and guidebook.  Patient learns the recommended Pritikin exercise program. Exercise with the goal of living a long, healthy life. Some of the health benefits of exercise include controlled  diabetes, healthier blood pressure levels, improved cholesterol levels, improved heart and lung capacity, improved sleep, and better body composition. Everyone should speak with their doctor before starting or changing an exercise routine.  Biomechanical Limitations Clinical staff conducted group or individual video education with verbal and written material and guidebook.  Patient learns how biomechanical limitations can impact exercise and how we can mitigate and possibly overcome limitations to have an impactful and balanced exercise routine.  Body Composition Clinical staff conducted group or individual video education with verbal and written material and guidebook.  Patient learns that body composition (ratio of muscle mass to fat mass) is a key component to assessing overall fitness, rather than body weight alone. Increased fat mass, especially visceral belly fat, can put [place] at increased risk for metabolic syndrome, type 2 diabetes, heart disease, and even death. It is recommended to combine diet and exercise (cardiovascular and resistance training) to improve your body composition. Seek  guidance from your physician and exercise physiologist before implementing an exercise routine.  Exercise Action Plan Clinical staff conducted group or individual video education with verbal and written material and guidebook.  Patient learns the recommended strategies to achieve and enjoy long-term exercise adherence, including variety, self-motivation, self-efficacy, and positive decision making. Benefits of exercise include fitness, good health, weight management, more energy, better sleep, less stress, and overall well-being.  Medical   Heart Disease Risk Reduction Clinical staff conducted group or individual video education with verbal and written material and guidebook.  Patient learns our heart is our most vital organ as it circulates oxygen, nutrients, white blood cells, and hormones throughout the entire body, and carries waste away. Data supports a plant-based eating plan like the Pritikin Program for its effectiveness in slowing progression of and reversing heart disease. The video provides a number of recommendations to address heart disease.   Metabolic Syndrome and Belly Fat  Clinical staff conducted group or individual video education with verbal and written material and guidebook.  Patient learns what metabolic syndrome is, how it leads to heart disease, and how one can reverse it and keep it from coming back. You have metabolic syndrome if you have 3 of the following 5 criteria: abdominal obesity, high blood pressure, high triglycerides, low HDL cholesterol, and high blood sugar.  Hypertension and Heart Disease Clinical staff conducted group or individual video education with verbal and written material and guidebook.  Patient learns that high blood pressure, or hypertension, is very common in the [place]. Hypertension is largely due to excessive salt intake, but other important risk factors include being overweight, physical inactivity, drinking too much alcohol, smoking, and  not eating enough potassium from fruits and vegetables. High blood pressure is a leading risk factor for heart attack, stroke, congestive heart failure, dementia, kidney failure, and premature death. Long-term effects of excessive salt intake include stiffening of the arteries and thickening of heart muscle and organ damage. Recommendations include ways to reduce hypertension and the risk of heart disease.  Diseases of Our Time - Focusing on Diabetes Clinical staff conducted group or individual video education with verbal and written material and guidebook.  Patient learns why the best way to stop diseases of our time is prevention, through food and other lifestyle changes. Medicine (such as prescription pills and surgeries) is often only a Band-Aid on the problem, not a long-term solution. Most common diseases of our time include obesity, type 2 diabetes, hypertension, heart disease, and cancer. The Pritikin Program is recommended and has been  proven to help reduce, reverse, and/or prevent the damaging effects of metabolic syndrome.  Nutrition   Overview of the Pritikin Eating Plan  Clinical staff conducted group or individual video education with verbal and written material and guidebook.  Patient learns about the Pritikin Eating Plan for disease risk reduction. The Pritikin Eating Plan emphasizes a wide variety of unrefined, minimally-processed carbohydrates, like fruits, vegetables, whole grains, and legumes. Go, Caution, and Stop food choices are explained. Plant-based and lean animal proteins are emphasized. Rationale provided for low sodium intake for blood pressure control, low added sugars for blood sugar stabilization, and low added fats and oils for coronary artery disease risk reduction and weight management.  Calorie Density  Clinical staff conducted group or individual video education with verbal and written material and guidebook.  Patient learns about calorie density and how it impacts  the Pritikin Eating Plan. Knowing the characteristics of the food you choose will help you decide whether those foods will lead to weight gain or weight loss, and whether you want to consume more or less of them. Weight loss is usually a side effect of the Pritikin Eating Plan because of its focus on low calorie-dense foods.  Label Reading  Clinical staff conducted group or individual video education with verbal and written material and guidebook.  Patient learns about the Pritikin recommended label reading guidelines and corresponding recommendations regarding calorie density, added sugars, sodium content, and whole grains.  Dining Out - Part 1  Clinical staff conducted group or individual video education with verbal and written material and guidebook.  Patient learns that restaurant meals can be sabotaging because they can be so high in calories, fat, sodium, and/or sugar. Patient learns recommended strategies on how to positively address this and avoid unhealthy pitfalls.  Facts on Fats  Clinical staff conducted group or individual video education with verbal and written material and guidebook.  Patient learns that lifestyle modifications can be just as effective, if not more so, as many medications for lowering your risk of heart disease. A Pritikin lifestyle can help to reduce your risk of inflammation and atherosclerosis (cholesterol build-up, or plaque, in the artery walls). Lifestyle interventions such as dietary choices and physical activity address the cause of atherosclerosis. A review of the types of fats and their impact on blood cholesterol levels, along with dietary recommendations to reduce fat intake is also included.  Nutrition Action Plan  Clinical staff conducted group or individual video education with verbal and written material and guidebook.  Patient learns how to incorporate Pritikin recommendations into their lifestyle. Recommendations include planning and keeping personal  health goals in mind as an important part of their success.  Healthy Mind-Set    Healthy Minds, Bodies, Hearts  Clinical staff conducted group or individual video education with verbal and written material and guidebook.  Patient learns how to identify when they are stressed. Video will discuss the impact of that stress, as well as the many benefits of stress management. Patient will also be introduced to stress management techniques. The way we think, act, and feel has an impact on our hearts.  How Our Thoughts Can Heal Our Hearts  Clinical staff conducted group or individual video education with verbal and written material and guidebook.  Patient learns that negative thoughts can cause depression and anxiety. This can result in negative lifestyle behavior and serious health problems. Cognitive behavioral therapy is an effective method to help control our thoughts in order to change and improve our emotional outlook.  Additional Videos:  Exercise    Improving Performance  Clinical staff conducted group or individual video education with verbal and written material and guidebook.  Patient learns to use a non-linear approach by alternating intensity levels and lengths of time spent exercising to help burn more calories and lose more body fat. Cardiovascular exercise helps improve heart health, metabolism, hormonal balance, blood sugar control, and recovery from fatigue. Resistance training improves strength, endurance, balance, coordination, reaction time, metabolism, and muscle mass. Flexibility exercise improves circulation, posture, and balance. Seek guidance from your physician and exercise physiologist before implementing an exercise routine and learn your capabilities and proper form for all exercise.  Introduction to Yoga  Clinical staff conducted group or individual video education with verbal and written material and guidebook.  Patient learns about yoga, a discipline of the coming  together of mind, breath, and body. The benefits of yoga include improved flexibility, improved range of motion, better posture and core strength, increased lung function, weight loss, and positive self-image. Yoga's heart health benefits include lowered blood pressure, healthier heart rate, decreased cholesterol and triglyceride levels, improved immune function, and reduced stress. Seek guidance from your physician and exercise physiologist before implementing an exercise routine and learn your capabilities and proper form for all exercise.  Medical   Aging: Enhancing Your Quality of Life  Clinical staff conducted group or individual video education with verbal and written material and guidebook.  Patient learns key strategies and recommendations to stay in good physical health and enhance quality of life, such as prevention strategies, having an advocate, securing a Health Care Proxy and Power of Attorney, and keeping a list of medications and system for tracking them. It also discusses how to avoid risk for bone loss.  Biology of Weight Control  Clinical staff conducted group or individual video education with verbal and written material and guidebook.  Patient learns that weight gain occurs because we consume more calories than we burn (eating more, moving less). Even if your body weight is normal, you may have higher ratios of fat compared to muscle mass. Too much body fat puts you at increased risk for cardiovascular disease, heart attack, stroke, type 2 diabetes, and obesity-related cancers. In addition to exercise, following the Pritikin Eating Plan can help reduce your risk.  Decoding Lab Results  Clinical staff conducted group or individual video education with verbal and written material and guidebook.  Patient learns that lab test reflects one measurement whose values change over time and are influenced by many factors, including medication, stress, sleep, exercise, food, hydration,  pre-existing medical conditions, and more. It is recommended to use the knowledge from this video to become more involved with your lab results and evaluate your numbers to speak with your doctor.   Diseases of Our Time - Overview  Clinical staff conducted group or individual video education with verbal and written material and guidebook.  Patient learns that according to the CDC, 50% to 70% of chronic diseases (such as obesity, type 2 diabetes, elevated lipids, hypertension, and heart disease) are avoidable through lifestyle improvements including healthier food choices, listening to satiety cues, and increased physical activity.  Sleep Disorders Clinical staff conducted group or individual video education with verbal and written material and guidebook.  Patient learns how good quality and duration of sleep are important to overall health and well-being. Patient also learns about sleep disorders and how they impact health along with recommendations to address them, including discussing with a physician.  Nutrition  Dining  Out - Part 2 Clinical staff conducted group or individual video education with verbal and written material and guidebook.  Patient learns how to plan ahead and communicate in order to maximize their dining experience in a healthy and nutritious manner. Included are recommended food choices based on the type of restaurant the patient is visiting.   Fueling a Banker conducted group or individual video education with verbal and written material and guidebook.  There is a strong connection between our food choices and our health. Diseases like obesity and type 2 diabetes are very prevalent and are in large-part due to lifestyle choices. The Pritikin Eating Plan provides plenty of food and hunger-curbing satisfaction. It is easy to follow, affordable, and helps reduce health risks.  Menu Workshop  Clinical staff conducted group or individual video education  with verbal and written material and guidebook.  Patient learns that restaurant meals can sabotage health goals because they are often packed with calories, fat, sodium, and sugar. Recommendations include strategies to plan ahead and to communicate with the manager, chef, or server to help order a healthier meal.  Planning Your Eating Strategy  Clinical staff conducted group or individual video education with verbal and written material and guidebook.  Patient learns about the Pritikin Eating Plan and its benefit of reducing the risk of disease. The Pritikin Eating Plan does not focus on calories. Instead, it emphasizes high-quality, nutrient-rich foods. By knowing the characteristics of the foods, we choose, we can determine their calorie density and make informed decisions.  Targeting Your Nutrition Priorities  Clinical staff conducted group or individual video education with verbal and written material and guidebook.  Patient learns that lifestyle habits have a tremendous impact on disease risk and progression. This video provides eating and physical activity recommendations based on your personal health goals, such as reducing LDL cholesterol, losing weight, preventing or controlling type 2 diabetes, and reducing high blood pressure.  Vitamins and Minerals  Clinical staff conducted group or individual video education with verbal and written material and guidebook.  Patient learns different ways to obtain key vitamins and minerals, including through a recommended healthy diet. It is important to discuss all supplements you take with your doctor.   Healthy Mind-Set    Smoking Cessation  Clinical staff conducted group or individual video education with verbal and written material and guidebook.  Patient learns that cigarette smoking and tobacco addiction pose a serious health risk which affects millions of people. Stopping smoking will significantly reduce the risk of heart disease, lung disease,  and many forms of cancer. Recommended strategies for quitting are covered, including working with your doctor to develop a successful plan.  Culinary   Becoming a Set designer conducted group or individual video education with verbal and written material and guidebook.  Patient learns that cooking at home can be healthy, cost-effective, quick, and puts them in control. Keys to cooking healthy recipes will include looking at your recipe, assessing your equipment needs, planning ahead, making it simple, choosing cost-effective seasonal ingredients, and limiting the use of added fats, salts, and sugars.  Cooking - Breakfast and Snacks  Clinical staff conducted group or individual video education with verbal and written material and guidebook.  Patient learns how important breakfast is to satiety and nutrition through the entire day. Recommendations include key foods to eat during breakfast to help stabilize blood sugar levels and to prevent overeating at meals later in the day. Planning ahead is also  a key component.  Cooking - Educational psychologistDinner and Sides  Clinical staff conducted group or individual video education with verbal and written material and guidebook.  Patient learns eating strategies to improve overall health, including an approach to cook more at home. Recommendations include thinking of animal protein as a side on your plate rather than center stage and focusing instead on lower calorie dense options like vegetables, fruits, whole grains, and plant-based proteins, such as beans. Making sauces in large quantities to freeze for later and leaving the skin on your vegetables are also recommended to maximize your experience.  Cooking - Healthy Salads and Dressing Clinical staff conducted group or individual video education with verbal and written material and guidebook.  Patient learns that vegetables, fruits, whole grains, and legumes are the foundations of the Pritikin Eating Plan.  Recommendations include how to incorporate each of these in flavorful and healthy salads, and how to create homemade salad dressings. Proper handling of ingredients is also covered. Cooking - Soups and [employer] Farm[employer]esserts  Cooking - Soups and Desserts Clinical staff conducted group or individual video education with verbal and written material and guidebook.  Patient learns that Pritikin soups and desserts make for easy, nutritious, and delicious snacks and meal components that are low in sodium, fat, sugar, and calorie density, while high in vitamins, minerals, and filling fiber. Recommendations include simple and healthy ideas for soups and desserts.   Overview     The Pritikin Solution Program Overview Clinical staff conducted group or individual video education with verbal and written material and guidebook.  Patient learns that the results of the Pritikin Program have been documented in more than 100 articles published in peer-reviewed journals, and the benefits include reducing risk factors for (and, in some cases, even reversing) high cholesterol, high blood pressure, type 2 diabetes, obesity, and more! An overview of the three key pillars of the Pritikin Program will be covered: eating well, doing regular exercise, and having a healthy mind-set.  WORKSHOPS  Exercise: Exercise Basics: Building Your Action Plan Clinical staff led group instruction and group discussion with PowerPoint presentation and patient guidebook. To enhance the learning environment the use of posters, models and videos may be added. At the conclusion of this workshop, patients will comprehend the difference between physical activity and exercise, as well as the benefits of incorporating both, into their routine. Patients will understand the FITT (Frequency, Intensity, Time, and Type) principle and how to use it to build an exercise action plan. In addition, safety concerns and other considerations for exercise and cardiac rehab  will be addressed by the presenter. The purpose of this lesson is to promote a comprehensive and effective weekly exercise routine in order to improve patients' overall level of fitness.   Managing Heart Disease: Your Path to a Healthier Heart Clinical staff led group instruction and group discussion with PowerPoint presentation and patient guidebook. To enhance the learning environment the use of posters, models and videos may be added.At the conclusion of this workshop, patients will understand the anatomy and physiology of the heart. Additionally, they will understand how Pritikin's three pillars impact the risk factors, the progression, and the management of heart disease.  The purpose of this lesson is to provide a high-level overview of the heart, heart disease, and how the Pritikin lifestyle positively impacts risk factors.  Exercise Biomechanics Clinical staff led group instruction and group discussion with PowerPoint presentation and patient guidebook. To enhance the learning environment the use of posters, models and videos may  be added. Patients will learn how the structural parts of their bodies function and how these functions impact their daily activities, movement, and exercise. Patients will learn how to promote a neutral spine, learn how to manage pain, and identify ways to improve their physical movement in order to promote healthy living. The purpose of this lesson is to expose patients to common physical limitations that impact physical activity. Participants will learn practical ways to adapt and manage aches and pains, and to minimize their effect on regular exercise. Patients will learn how to maintain good posture while sitting, walking, and lifting.  Balance Training and Fall Prevention  Clinical staff led group instruction and group discussion with PowerPoint presentation and patient guidebook. To enhance the learning environment the use of posters, models and videos  may be added. At the conclusion of this workshop, patients will understand the importance of their sensorimotor skills (vision, proprioception, and the vestibular system) in maintaining their ability to balance as they age. Patients will apply a variety of balancing exercises that are appropriate for their current level of function. Patients will understand the common causes for poor balance, possible solutions to these problems, and ways to modify their physical environment in order to minimize their fall risk. The purpose of this lesson is to teach patients about the importance of maintaining balance as they age and ways to minimize their risk of falling.  WORKSHOPS   Nutrition:  Fueling a Ship broker led group instruction and group discussion with PowerPoint presentation and patient guidebook. To enhance the learning environment the use of posters, models and videos may be added. Patients will review the foundational principles of the [facility]in Eating Plan and understand what constitutes a serving size in each of the food groups. Patients will also learn Pritikin-friendly foods that are better choices when away from home and review make-ahead meal and snack options. Calorie density will be reviewed and applied to three nutrition priorities: weight maintenance, weight loss, and weight gain. The purpose of this lesson is to reinforce (in a group setting) the key concepts around what patients are recommended to eat and how to apply these guidelines when away from home by planning and selecting Pritikin-friendly options. Patients will understand how calorie density may be adjusted for different weight management goals.  Mindful Eating  Clinical staff led group instruction and group discussion with PowerPoint presentation and patient guidebook. To enhance the learning environment the use of posters, models and videos may be added. Patients will briefly review the concepts of the Pritikin  Eating Plan and the importance of low-calorie dense foods. The concept of mindful eating will be introduced as well as the importance of paying attention to internal hunger signals. Triggers for non-hunger eating and techniques for dealing with triggers will be explored. The purpose of this lesson is to provide patients with the opportunity to review the basic principles of the Pritikin Eating Plan, discuss the value of eating mindfully and how to measure internal cues of hunger and fullness using the Hunger Scale. Patients will also discuss reasons for non-hunger eating and learn strategies to use for controlling emotional eating.  Targeting Your Nutrition Priorities Clinical staff led group instruction and group discussion with PowerPoint presentation and patient guidebook. To enhance the learning environment the use of posters, models and videos may be added. Patients will learn how to determine their genetic susceptibility to disease by reviewing their family history. Patients will gain insight into the importance of diet as part of  an overall healthy lifestyle in mitigating the impact of genetics and other environmental insults. The purpose of this lesson is to provide patients with the opportunity to assess their personal nutrition priorities by looking at their family history, their own health history and current risk factors. Patients will also be able to discuss ways of prioritizing and modifying the Pritikin Eating Plan for their highest risk areas  Menu  Clinical staff led group instruction and group discussion with PowerPoint presentation and patient guidebook. To enhance the learning environment the use of posters, models and videos may be added. Using menus brought in from [employer], or printed from [employer], patients will apply the Pritikin dining out guidelines that were presented in the [employer] video. Patients will also be able to practice these guidelines  in a variety of provided scenarios. The purpose of this lesson is to provide patients with the opportunity to practice hands-on learning of the Pritikin Dining Out guidelines with actual menus and practice scenarios.  Label Reading Clinical staff led group instruction and group discussion with PowerPoint presentation and patient guidebook. To enhance the learning environment the use of posters, models and videos may be added. Patients will review and discuss the Pritikin label reading guidelines presented in [employer]'s Label Reading Educational series video. Using fool labels brought in from local grocery stores and markets, patients will apply the label reading guidelines and determine if the packaged food meet the Pritikin guidelines. The purpose of this lesson is to provide patients with the opportunity to review, discuss, and practice hands-on learning of the Pritikin Label Reading guidelines with actual packaged food labels. Cooking School  [facility]'s [employer] are designed to teach patients ways to prepare quick, simple, and affordable recipes at home. The importance of nutrition's role in chronic disease risk reduction is reflected in its emphasis in the overall Pritikin program. By learning how to prepare essential core Pritikin Eating Plan recipes, patients will increase control over what they eat; be able to customize the flavor of foods without the use of added salt, sugar, or fat; and improve the quality of the food they consume. By learning a set of core recipes which are easily assembled, quickly prepared, and affordable, patients are more likely to prepare more healthy foods at home. These workshops focus on convenient breakfasts, simple entres, side dishes, and desserts which can be prepared with minimal effort and are consistent with nutrition recommendations for cardiovascular risk reduction. Cooking [employer] are taught by a Armed forces logistics/support/administrative officer (RD) who has  been trained by the [employer]. The chef or RD has a clear understanding of the importance of minimizing - if not completely eliminating - added fat, sugar, and sodium in recipes. Throughout the series of Cooking School Workshop sessions, patients will learn about healthy ingredients and efficient methods of cooking to build confidence in their capability to prepare    Cooking School weekly topics:  Adding Flavor- Sodium-Free  Fast and Healthy Breakfasts  Powerhouse Plant-Based Proteins  Satisfying Salads and Dressings  Simple Sides and Sauces  International Cuisine-Spotlight on the [employer] Zones  Delicious Desserts  Savory Soups  [employer] - Meals in a Astronomer Appetizers and Snacks  Comforting Weekend Breakfasts  One-Pot Wonders   Fast Evening Meals  Landscape architect Your Pritikin Plate  WORKSHOPS   Healthy Mindset (Psychosocial):  Focused Goals, Sustainable Changes Clinical staff led group instruction and group discussion with PowerPoint presentation and patient  guidebook. To enhance the learning environment the use of posters, models and videos may be added. Patients will be able to apply effective goal setting strategies to establish at least one personal goal, and then take consistent, meaningful action toward that goal. They will learn to identify common barriers to achieving personal goals and develop strategies to overcome them. Patients will also gain an understanding of how our mind-set can impact our ability to achieve goals and the importance of cultivating a positive and growth-oriented mind-set. The purpose of this lesson is to provide patients with a deeper understanding of how to set and achieve personal goals, as well as the tools and strategies needed to overcome common obstacles which may arise along the way.  From Head to Heart: The Power of a Healthy Outlook  Clinical staff led group instruction and group discussion with  PowerPoint presentation and patient guidebook. To enhance the learning environment the use of posters, models and videos may be added. Patients will be able to recognize and describe the impact of emotions and mood on physical health. They will discover the importance of self-care and explore self-care practices which may work for them. Patients will also learn how to utilize the 4 C's to cultivate a healthier outlook and better manage stress and challenges. The purpose of this lesson is to demonstrate to patients how a healthy outlook is an essential part of maintaining good health, especially as they continue their cardiac rehab journey.  Healthy Sleep for a Healthy Heart Clinical staff led group instruction and group discussion with PowerPoint presentation and patient guidebook. To enhance the learning environment the use of posters, models and videos may be added. At the conclusion of this workshop, patients will be able to demonstrate knowledge of the importance of sleep to overall health, well-being, and quality of life. They will understand the symptoms of, and treatments for, common sleep disorders. Patients will also be able to identify daytime and nighttime behaviors which impact sleep, and they will be able to apply these tools to help manage sleep-related challenges. The purpose of this lesson is to provide patients with a general overview of sleep and outline the importance of quality sleep. Patients will learn about a few of the most common sleep disorders. Patients will also be introduced to the concept of "sleep hygiene," and discover ways to self-manage certain sleeping problems through simple daily behavior changes. Finally, the workshop will motivate patients by clarifying the links between quality sleep and their goals of heart-healthy living.   Recognizing and Reducing Stress Clinical staff led group instruction and group discussion with PowerPoint presentation and patient guidebook. To  enhance the learning environment the use of posters, models and videos may be added. At the conclusion of this workshop, patients will be able to understand the types of stress reactions, differentiate between acute and chronic stress, and recognize the impact that chronic stress has on their health. They will also be able to apply different coping mechanisms, such as reframing negative self-talk. Patients will have the opportunity to practice a variety of stress management techniques, such as deep abdominal breathing, progressive muscle relaxation, and/or guided imagery.  The purpose of this lesson is to educate patients on the role of stress in their lives and to provide healthy techniques for coping with it.  Learning Barriers/Preferences:  Learning Barriers/Preferences - 01/09/23 1439       Learning Barriers/Preferences   Learning Barriers None    Learning Preferences Written Material;Computer/Internet;Skilled Demonstration;Individual Instruction;Group Instruction  Education Topics:  Knowledge Questionnaire Score:  Knowledge Questionnaire Score - 01/09/23 1439       Knowledge Questionnaire Score   Pre Score 22/24             Core Components/Risk Factors/Patient Goals at Admission:  Personal Goals and Risk Factors at Admission - 01/09/23 1525       Core Components/Risk Factors/Patient Goals on Admission    Weight Management Yes;Weight Loss    Intervention Weight Management: Develop a combined nutrition and exercise program designed to reach desired caloric intake, while maintaining appropriate intake of nutrient and fiber, sodium and fats, and appropriate energy expenditure required for the weight goal.;Weight Management: Provide education and appropriate resources to help participant work on and attain dietary goals.;Weight Management/Obesity: Establish reasonable short term and long term weight goals.;Obesity: Provide education and appropriate resources to help  participant work on and attain dietary goals.    Admit Weight 240 lb 4.8 oz (109 kg)    Expected Outcomes Short Term: Continue to assess and modify interventions until short term weight is achieved;Long Term: Adherence to nutrition and physical activity/exercise program aimed toward attainment of established weight goal;Weight Loss: Understanding of general recommendations for a balanced deficit meal plan, which promotes 1-2 lb weight loss per week and includes a negative energy balance of [phone] kcal/d;Understanding recommendations for meals to include 15-35% energy as protein, 25-35% energy from fat, 35-60% energy from carbohydrates, less than 200mg  of dietary cholesterol, 20-35 gm of total fiber daily;Understanding of distribution of calorie intake throughout the day with the consumption of 4-5 meals/snacks    Hypertension Yes    Intervention Provide education on lifestyle modifcations including regular physical activity/exercise, weight management, moderate sodium restriction and increased consumption of fresh fruit, vegetables, and low fat dairy, alcohol moderation, and smoking cessation.;Monitor prescription use compliance.    Expected Outcomes Short Term: Continued assessment and intervention until BP is < 140/63mm HG in hypertensive participants. < 130/77mm HG in hypertensive participants with diabetes, heart failure or chronic kidney disease.;Long Term: Maintenance of blood pressure at goal levels.             Core Components/Risk Factors/Patient Goals Review:   Goals and Risk Factor Review     Row Name 01/17/23 1418 01/24/23 0925 02/15/23 0935         Core Components/Risk Factors/Patient Goals Review   Personal Goals Review Weight Management/Obesity;Hypertension Weight Management/Obesity;Hypertension Weight Management/Obesity;Hypertension     Review [facility] started intensive cardiac rehab on 01/17/23 and did well with exercise. Vital signs stable Ass[place]ta started intensive  cardiac rehab on 01/17/23 and is off to a good start to exercise. Vital signs have been stable. Jaqueline's weight was up on Monday. Weight today 110.3 kg. which is down. Jaqueline did notify the transplant coordinator. No complaints or symptoms. Sumaya is doing well with exercise at  intensive cardiac rehab . Vital signs have been stable. Jaqueline has gained 1.4 kg since starting the program,     Expected Outcomes Jaqueline will continue to participate in intensive cardiac rehab for exercise, nutrtion and lifestyle modifications Jaqueline will continue to participate in intensive cardiac rehab for exercise, nutrtion and lifestyle modifications Jaqueline will continue to participate in intensive cardiac rehab for exercise, nutrtion and lifestyle modifications              Core Components/Risk Factors/Patient Goals at Discharge (Final Review):   Goals and Risk Factor Review - 02/15/23 0935       Core Components/Risk Factors/Patient Goals Review  Personal Goals Review Weight Management/Obesity;Hypertension    Review Afiya is doing well with exercise at  intensive cardiac rehab . Vital signs have been stable. Jaqueline has gained 1.4 kg since starting the program,    Expected Outcomes Jaqueline will continue to participate in intensive cardiac rehab for exercise, nutrtion and lifestyle modifications             ITP Comments:  ITP Comments     Row Name 01/09/23 1359 01/17/23 1412 01/24/23 0919 02/15/23 0933     ITP Comments Armanda Magic, MD - Medical Director. Introduction to the [facility] Education Program/Intensive Cardiac Rehab. Initial orientation packet reviewed with the patient 30 Day ITP Review. Jaqueline started intensive cardiac rehab on 01/17/23 and did well with exercise 30 Day ITP Review. Jaqueline started intensive cardiac rehab on 01/17/23 and is off to a good start to exercise 30 Day ITP Review. Gaspar Cola has good attendance and participation in intensive cardiac  rehab             Comments: See ITP comments.

## 2023-02-16 ENCOUNTER — Encounter (HOSPITAL_COMMUNITY): Payer: BC Managed Care – PPO

## 2023-02-19 ENCOUNTER — Encounter (HOSPITAL_COMMUNITY)
Admission: RE | Admit: 2023-02-19 | Discharge: 2023-02-19 | Disposition: A | Discharge status: Home / Self Care | Payer: BC Managed Care – PPO | Source: Ambulatory Visit | Attending: Cardiology | Admitting: Cardiology

## 2023-02-19 DIAGNOSIS — Z941 Heart transplant status: Secondary | ICD-10-CM

## 2023-02-19 DIAGNOSIS — I5022 Chronic systolic (congestive) heart failure: Secondary | ICD-10-CM

## 2023-02-21 ENCOUNTER — Encounter (HOSPITAL_COMMUNITY): Payer: BC Managed Care – PPO

## 2023-02-23 ENCOUNTER — Other Ambulatory Visit: Payer: Self-pay | Admitting: Internal Medicine

## 2023-02-23 ENCOUNTER — Encounter (HOSPITAL_COMMUNITY)
Admission: RE | Admit: 2023-02-23 | Discharge: 2023-02-23 | Disposition: A | Discharge status: Home / Self Care | Payer: BC Managed Care – PPO | Source: Ambulatory Visit | Attending: Cardiology | Admitting: Cardiology

## 2023-02-23 DIAGNOSIS — Z941 Heart transplant status: Secondary | ICD-10-CM

## 2023-02-26 ENCOUNTER — Encounter (HOSPITAL_COMMUNITY)
Admission: RE | Admit: 2023-02-26 | Discharge: 2023-02-26 | Disposition: A | Discharge status: Home / Self Care | Payer: BC Managed Care – PPO | Source: Ambulatory Visit | Attending: Cardiology | Admitting: Cardiology

## 2023-02-26 DIAGNOSIS — Z941 Heart transplant status: Secondary | ICD-10-CM | POA: Diagnosis not present

## 2023-02-27 NOTE — Progress Notes (Signed)
Reviewed home exercise Rx with patient today.  Encouraged warm-up, cool-down, and stretching. Reviewed THRR of 78-156 and keeping RPE between 11-13. Encouraged to hydrate with activity.  Reviewed weather parameters for temperature and humidity for safe exercise outdoors. Reviewed S/S to terminate exercise and when to call 911 vs MD. Pt encouraged to always carry a cell phone for safety when exercising outdoors. Pt verbalized understanding of the home exercise Rx and was provided a copy.   Ted Goodner MS, ACSM-CEP, CCRP  

## 2023-02-28 ENCOUNTER — Encounter (HOSPITAL_COMMUNITY)
Admission: RE | Admit: 2023-02-28 | Discharge: 2023-02-28 | Disposition: A | Discharge status: Home / Self Care | Payer: BC Managed Care – PPO | Source: Ambulatory Visit | Attending: Cardiology | Admitting: Cardiology

## 2023-02-28 DIAGNOSIS — Z941 Heart transplant status: Secondary | ICD-10-CM

## 2023-02-28 DIAGNOSIS — I5022 Chronic systolic (congestive) heart failure: Secondary | ICD-10-CM

## 2023-03-02 ENCOUNTER — Encounter (HOSPITAL_COMMUNITY)
Admission: RE | Admit: 2023-03-02 | Discharge: 2023-03-02 | Disposition: A | Discharge status: Home / Self Care | Payer: BC Managed Care – PPO | Source: Ambulatory Visit | Attending: Cardiology | Admitting: Cardiology

## 2023-03-02 DIAGNOSIS — Z941 Heart transplant status: Secondary | ICD-10-CM

## 2023-03-02 DIAGNOSIS — I5022 Chronic systolic (congestive) heart failure: Secondary | ICD-10-CM

## 2023-03-05 ENCOUNTER — Encounter (HOSPITAL_COMMUNITY)
Admission: RE | Admit: 2023-03-05 | Discharge: 2023-03-05 | Disposition: A | Discharge status: Home / Self Care | Payer: BC Managed Care – PPO | Source: Ambulatory Visit | Attending: Cardiology | Admitting: Cardiology

## 2023-03-05 DIAGNOSIS — Z941 Heart transplant status: Secondary | ICD-10-CM | POA: Diagnosis not present

## 2023-03-07 ENCOUNTER — Encounter (HOSPITAL_COMMUNITY)
Admission: RE | Admit: 2023-03-07 | Discharge: 2023-03-07 | Disposition: A | Discharge status: Home / Self Care | Payer: BC Managed Care – PPO | Source: Ambulatory Visit | Attending: Cardiology | Admitting: Cardiology

## 2023-03-07 DIAGNOSIS — I5022 Chronic systolic (congestive) heart failure: Secondary | ICD-10-CM

## 2023-03-07 DIAGNOSIS — Z941 Heart transplant status: Secondary | ICD-10-CM

## 2023-03-09 ENCOUNTER — Encounter (HOSPITAL_COMMUNITY): Payer: BC Managed Care – PPO

## 2023-03-12 ENCOUNTER — Encounter (HOSPITAL_COMMUNITY)
Admission: RE | Admit: 2023-03-12 | Discharge: 2023-03-12 | Disposition: A | Discharge status: Home / Self Care | Payer: BC Managed Care – PPO | Source: Ambulatory Visit | Attending: Cardiology | Admitting: Cardiology

## 2023-03-12 DIAGNOSIS — Z941 Heart transplant status: Secondary | ICD-10-CM | POA: Diagnosis not present

## 2023-03-14 ENCOUNTER — Encounter (HOSPITAL_COMMUNITY): Payer: BC Managed Care – PPO

## 2023-03-14 NOTE — Progress Notes (Signed)
Cardiac Individual Treatment Plan  Patient Details  Name: [name] MRN: [id number] Date of Birth: November 14, 1995 Referring Provider:   Flowsheet Row INTENSIVE CARDIAC REHAB ORIENT from 01/09/2023 in [facility]  Referring Provider Dr. Edwena Blow (Armanda Magic, MD covering)       Initial Encounter Date:  Flowsheet Row INTENSIVE CARDIAC REHAB ORIENT from 01/09/2023 in [facility] for [facility], [facility], [facility]  Date 01/09/23       Visit Diagnosis: 10/31/22 Heart transplant at [facility]  Patient's Home Medications on Admission:  Current Outpatient Medications:    acetaminophen (TYLENOL) 325 MG tablet, Take 650 mg by mouth every 6 (six) hours as needed., Disp: , Rfl:    ASPIRIN ADULT LOW DOSE 81 MG tablet, Take 81 mg by mouth daily., Disp: , Rfl:    buPROPion (WELLBUTRIN SR) 150 MG 12 hr tablet, Take 150 mg by mouth daily., Disp: , Rfl:    Calcium Carb-Cholecalciferol 600-10 MG-MCG TABS, Take 600 mcg by mouth in the morning and at bedtime., Disp: , Rfl:    diazepam (VALIUM) 5 MG tablet, Take 5 mg by mouth as needed. Prior to biopsy, Disp: , Rfl:    ELIQUIS 5 MG TABS tablet, Take 5 mg by mouth 2 (two) times daily., Disp: , Rfl:    fluconazole (DIFLUCAN) 150 MG tablet, Take 1 tablet (150 mg total) by mouth as needed. (Patient not taking: Reported on 01/09/2023), Disp: 30 tablet, Rfl: 0   furosemide (LASIX) 40 MG tablet, Take 1 tablet (40 mg total) by mouth daily., Disp: 30 tablet, Rfl: 6   gabapentin (NEURONTIN) 400 MG capsule, Take 400 mg by mouth 3 (three) times daily., Disp: , Rfl:    hydrOXYzine (VISTARIL) 25 MG capsule, Take 25 mg by mouth 2 (two) times daily as needed for anxiety., Disp: , Rfl:    ketoconazole (NIZORAL) 2 % cream, Apply 1 Application topically daily as needed for irritation., Disp: , Rfl:    ketoconazole (NIZORAL) 2 % shampoo, Apply 1 Application topically 2 (two) times a week. As needed,  Disp: , Rfl:    lidocaine (LIDODERM) 5 %, Place 1 patch onto the skin daily., Disp: , Rfl:    methocarbamol (ROBAXIN) 500 MG tablet, Take 500 mg by mouth 3 (three) times daily as needed for muscle spasms., Disp: , Rfl:    mycophenolate (CELLCEPT) 250 MG capsule, Take 1,000 mg by mouth 2 (two) times daily., Disp: , Rfl:    nystatin (MYCOSTATIN) 100000 UNIT/ML suspension, Take 5 mLs by mouth 4 (four) times daily., Disp: , Rfl:    pantoprazole (PROTONIX) 40 MG tablet, Take 40 mg by mouth daily., Disp: , Rfl:    pravastatin (PRAVACHOL) 40 MG tablet, Take 40 mg by mouth daily., Disp: , Rfl:    predniSONE (DELTASONE) 5 MG tablet, Take 7.5 mg by mouth daily with breakfast. Take as directed, Disp: , Rfl:    Semaglutide,0.25 or 0.5MG /DOS, 2 MG/3ML SOPN, Inject 0.25 mg into the skin once a week., Disp: , Rfl:    sertraline (ZOLOFT) 100 MG tablet, Take 100 mg by mouth daily., Disp: , Rfl:    spironolactone (ALDACTONE) 25 MG tablet, Take 0.5 tablets (12.5 mg total) by mouth at bedtime. NEEDS FOLLOW UP APPOINTMENT FOR ANYMORE REFILLS, Disp: 45 tablet, Rfl: 0   sulfamethoxazole-trimethoprim (BACTRIM) 400-80 MG tablet, Take 1 tablet by mouth daily., Disp: , Rfl:    tacrolimus (PROGRAF) 1 MG capsule, Take 1 mg  by mouth 2 (two) times daily. 4 mg at 0900 3 mg at 2100, Disp: , Rfl:    valACYclovir (VALTREX) 1000 MG tablet, Take 1,000 mg by mouth as needed for rash. (Patient not taking: Reported on 01/09/2023), Disp: , Rfl:    valGANciclovir (VALCYTE) 450 MG tablet, Take 900 mg by mouth daily., Disp: , Rfl:   Past Medical History: Past Medical History:  Diagnosis Date   Lactose intolerance    NICM (nonischemic cardiomyopathy) (HCC)    PVC's (premature ventricular contractions)    Torsades de pointes (HCC)     Tobacco Use: Social History   Tobacco Use  Smoking Status Never  Smokeless Tobacco Never    Labs: Review Flowsheet       Latest Ref Rng & Units 09/01/2022  Labs for ITP Cardiac and Pulmonary  Rehab  Bicarbonate 20.0 - 28.0 mmol/L 21.9  19.0  21.9   TCO2 22 - 32 mmol/L 23  20  23    Acid-base deficit 0.0 - 2.0 mmol/L 3.0  5.0  3.0   O2 Saturation % 68  66  66     Capillary Blood Glucose: No results found for: "GLUCAP"   Exercise Target Goals: Exercise Program Goal: Individual exercise prescription set using results from initial 6 min walk test and THRR while considering  patient's activity barriers and safety.   Exercise Prescription Goal: Initial exercise prescription builds to 30-45 minutes a day of aerobic activity, 2-3 days per week.  Home exercise guidelines will be given to patient during program as part of exercise prescription that the participant will acknowledge.  Activity Barriers & Risk Stratification:  Activity Barriers & Cardiac Risk Stratification - 01/09/23 1429       Activity Barriers & Cardiac Risk Stratification   Activity Barriers Muscular Weakness;Deconditioning;Back Problems    Cardiac Risk Stratification High             6 Minute Walk:  6 Minute Walk     Row Name 01/09/23 1209         6 Minute Walk   Phase Initial     Distance 1650 feet     Walk Time 6 minutes     # of Rest Breaks 0     MPH 3.13     METS 5.81     RPE 12     Perceived Dyspnea  0     VO2 Peak 20.3     Symptoms Yes (comment)     Comments Bilateral calf tightness     Resting HR 88 bpm     Resting BP 130/90     Resting Oxygen Saturation  98 %     Exercise Oxygen Saturation  during 6 min walk 96 %     Max Ex. HR 111 bpm     Max Ex. BP 164/88     2 Minute Post BP 126/80              Oxygen Initial Assessment:   Oxygen Re-Evaluation:   Oxygen Discharge (Final Oxygen Re-Evaluation):   Initial Exercise Prescription:  Initial Exercise Prescription - 01/09/23 1400       Date of Initial Exercise RX and Referring Provider   Date 01/09/23    Referring Provider Dr. Edwena Blow Armanda Magic, MD covering)    Expected Discharge Date 03/23/23      Arm  Ergometer   Level 1.5    Watts 25    RPM 60    Minutes 15    METs  5.8      Recumbant Elliptical   Level 2    RPM 60    Watts 90    Minutes 15    METs 5.8      Prescription Details   Frequency (times per week) 3    Duration Progress to 30 minutes of continuous aerobic without signs/symptoms of physical distress      Intensity   THRR 40-80% of Max Heartrate 78-156    Ratings of Perceived Exertion 11-13    Perceived Dyspnea 0-4      Progression   Progression Continue progressive overload as per policy without signs/symptoms or physical distress.      Resistance Training   Training Prescription Yes    Weight 3 lbs    Reps 10-15             Perform Capillary Blood Glucose checks as needed.  Exercise Prescription Changes:   Exercise Prescription Changes     Row Name 01/17/23 1200 02/02/23 1100 02/14/23 1040 02/23/23 1030       Response to Exercise   Blood Pressure (Admit) 124/80 128/88 108/78 114/80    Blood Pressure (Exercise) 138/88 136/74 148/76 128/74    Blood Pressure (Exit) 110/80 108/70 120/80 120/78    Heart Rate (Admit) 92 bpm 102 bpm 101 bpm 92 bpm    Heart Rate (Exercise) 117 bpm 116 bpm 114 bpm 113 bpm    Heart Rate (Exit) 101 bpm 103 bpm 106 bpm 99 bpm    Rating of Perceived Exertion (Exercise) 13 12 14 12     Symptoms None None None None    Comments Pt's first day in the CRP2 program Reviewed METs Reviewed METs and Goals Reviewed Home exercise Rx    Duration Continue with 30 min of aerobic exercise without signs/symptoms of physical distress. Continue with 30 min of aerobic exercise without signs/symptoms of physical distress. Continue with 30 min of aerobic exercise without signs/symptoms of physical distress. Continue with 30 min of aerobic exercise without signs/symptoms of physical distress.    Intensity THRR unchanged THRR unchanged THRR unchanged THRR unchanged      Progression   Progression Continue to progress workloads to maintain intensity  without signs/symptoms of physical distress. Continue to progress workloads to maintain intensity without signs/symptoms of physical distress. Continue to progress workloads to maintain intensity without signs/symptoms of physical distress. Continue to progress workloads to maintain intensity without signs/symptoms of physical distress.    Average METs 2.45 3.1 3.3 3.3      Resistance Training   Training Prescription No Yes No Yes    Weight No weights on Wednesdays 3 lbs No weights on wednesday 3 lbs    Reps -- 10-15 -- 10-15    Time -- 10 Minutes -- 10 Minutes      Interval Training   Interval Training No No No No      Arm Ergometer   Level 1.5 1.5 1.5 1.5    Watts -- -- 19 18    RPM 6 -- 71 68    Minutes 15 15 15 15     METs 1.9 2.1 2.2 2.4      Recumbant Elliptical   Level 2 2 2 2     RPM 64 -- 77 69    Watts 86 121 122 126    Minutes 15 15 15 15     METs 3 4.1 4.1 4.1      Home Exercise Plan   Plans to continue exercise at -- -- --  Home (comment)    Frequency -- -- -- Add 3 additional days to program exercise sessions.    Initial Home Exercises Provided -- -- -- 02/23/23             Exercise Comments:   Exercise Comments     Row Name 01/17/23 1218 02/02/23 1417 02/14/23 1050 02/23/23 1030     Exercise Comments Pt's first day in the cRP2 program. Pt exercised with no complaints. Reviewed METs with pt today. Pt is tolerating exercise well with an average MET level of 3.1. Pt continues to make great progress within the program and is happy with her progression. Will continue to monitor and progress workloads as tolerated without s/sx. Reviewed METs and goals. Pt feels postive about her progress and is enjoying the program and education. Reviewed home exercise Rx. Pt is walking at home 3.-5x/week for 30+ minutes. Pt verbalized understanding of the home exercise Rx and was provided a copy.             Exercise Goals and Review:   Exercise Goals     Row Name 01/09/23  1430             Exercise Goals   Increase Physical Activity Yes       Intervention Provide advice, education, support and counseling about physical activity/exercise needs.;Develop an individualized exercise prescription for aerobic and resistive training based on initial evaluation findings, risk stratification, comorbidities and participant's personal goals.       Expected Outcomes Short Term: Attend rehab on a regular basis to increase amount of physical activity.;Long Term: Add in home exercise to make exercise part of routine and to increase amount of physical activity.;Long Term: Exercising regularly at least 3-5 days a week.       Increase Strength and Stamina Yes       Intervention Provide advice, education, support and counseling about physical activity/exercise needs.;Develop an individualized exercise prescription for aerobic and resistive training based on initial evaluation findings, risk stratification, comorbidities and participant's personal goals.       Expected Outcomes Short Term: Increase workloads from initial exercise prescription for resistance, speed, and METs.;Short Term: Perform resistance training exercises routinely during rehab and add in resistance training at home;Long Term: Improve cardiorespiratory fitness, muscular endurance and strength as measured by increased METs and functional capacity ( )       Able to understand and use rate of perceived exertion (RPE) scale Yes       Intervention Provide education and explanation on how to use RPE scale       Expected Outcomes Short Term: Able to use RPE daily in rehab to express subjective intensity level;Long Term:  Able to use RPE to guide intensity level when exercising independently       Knowledge and understanding of Target Heart Rate Range (THRR) Yes       Intervention Provide education and explanation of THRR including how the numbers were predicted and where they are located for reference       Expected  Outcomes Short Term: Able to state/look up THRR;Long Term: Able to use THRR to govern intensity when exercising independently;Short Term: Able to use daily as guideline for intensity in rehab       Understanding of Exercise Prescription Yes       Intervention Provide education, explanation, and written materials on patient's individual exercise prescription       Expected Outcomes Short Term: Able to explain program exercise prescription;Long Term: Able to  explain home exercise prescription to exercise independently                Exercise Goals Re-Evaluation :  Exercise Goals Re-Evaluation     Row Name 01/17/23 1217 02/14/23 1050           Exercise Goal Re-Evaluation   Exercise Goals Review Increase Physical Activity;Increase Strength and Stamina;Able to understand and use rate of perceived exertion (RPE) scale;Knowledge and understanding of Target Heart Rate Range (THRR);Understanding of Exercise Prescription Increase Physical Activity;Increase Strength and Stamina;Able to understand and use rate of perceived exertion (RPE) scale;Knowledge and understanding of Target Heart Rate Range (THRR);Understanding of Exercise Prescription      Comments Pt's first day in the CRP2 program. Pt understnads the exercise Rx, RPE scale and THRR. Reviewed METs and goals. Pt is making progress. Pt voices that she has seen imrpove in her strength and stamina as well as her upperbody strength, both patient goals. Pt also has been able to walk 2 miles which is also a patient goal.      Expected Outcomes Will continue to montior patient and progress exercise workloads as tolerated. Will continue to montior patient and progress exercise workloads as tolerated.               Discharge Exercise Prescription (Final Exercise Prescription Changes):  Exercise Prescription Changes - 02/23/23 1030       Response to Exercise   Blood Pressure (Admit) 114/80    Blood Pressure (Exercise) 128/74    Blood Pressure  (Exit) 120/78    Heart Rate (Admit) 92 bpm    Heart Rate (Exercise) 113 bpm    Heart Rate (Exit) 99 bpm    Rating of Perceived Exertion (Exercise) 12    Symptoms None    Comments Reviewed Home exercise Rx    Duration Continue with 30 min of aerobic exercise without signs/symptoms of physical distress.    Intensity THRR unchanged      Progression   Progression Continue to progress workloads to maintain intensity without signs/symptoms of physical distress.    Average METs 3.3      Resistance Training   Training Prescription Yes    Weight 3 lbs    Reps 10-15    Time 10 Minutes      Interval Training   Interval Training No      Arm Ergometer   Level 1.5    Watts 18    RPM 68    Minutes 15    METs 2.4      Recumbant Elliptical   Level 2    RPM 69    Watts 126    Minutes 15    METs 4.1      Home Exercise Plan   Plans to continue exercise at Home (comment)    Frequency Add 3 additional days to program exercise sessions.    Initial Home Exercises Provided 02/23/23             Nutrition:  Target Goals: Understanding of nutrition guidelines, daily intake of sodium 1500mg , cholesterol 200mg , calories 30% from fat and 7% or less from saturated fats, daily to have 5 or more servings of fruits and vegetables.  Biometrics:  Pre Biometrics - 01/09/23 1042       Pre Biometrics   Waist Circumference 42.5 inches    Hip Circumference 49 inches    Waist to Hip Ratio 0.87 %    Triceps Skinfold 47 mm    % Body Fat  46.4 %    Grip Strength 33 kg    Flexibility 18 in    Single Leg Stand 30 seconds              Nutrition Therapy Plan and Nutrition Goals:  Nutrition Therapy & Goals - 02/14/23 1108       Nutrition Therapy   Diet Heart Healthy Diet    Drug/Food Interactions Statins/Certain Fruits      Personal Nutrition Goals   Nutrition Goal Patient to identify strategies for reducing cardiovascular risk by attending the [employer] education and nutrition series  weekly.    Personal Goal #2 Patient to improve diet quality by using the plate method as a guide for meal planning to include lean protein/plant protein, fruits, vegetables, whole grains, nonfat dairy as part of a well-balanced diet.    Personal Goal #3 Patient to identify strategies for weight loss of 0.5-2.0# per week.    Comments Goals in progress.Oonagh continues to attend the [employer] education and nutrition series regularly. Destoni reports weight gain of ~80# over the last year and she is motivated to lose weight. We have discussed multiple strategies for weight loss including benefits of high protein intake, reducing carbohydrate/fat intake, mindful eating, protein supplements, tracking intake, etc. She plans to discuss GLP-1s with her cardiologist. Per transplant notes, she will begin to wean off prednisone. 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 Cheetara will benefit from participation in intensive cardiac rehab for nutrition, exercise, and lifestyle modification.      Intervention Plan   Intervention Prescribe, educate and counsel regarding individualized specific dietary modifications aiming towards targeted core components such as weight, hypertension, lipid management, diabetes, heart failure and other comorbidities.;Nutrition handout(s) given to patient.    Expected Outcomes Short Term Goal: Understand basic principles of dietary content, such as calories, fat, sodium, cholesterol and nutrients.;Long Term Goal: Adherence to prescribed nutrition plan.             Nutrition Assessments:  Nutrition Assessments - 01/30/23 1145       Rate Your Plate Scores   Pre Score 69            MEDIFICTS Score Key: ?70 Need to make dietary changes  40-70 Heart Healthy Diet ? 40 Therapeutic Level Cholesterol Diet   Flowsheet Row INTENSIVE CARDIAC REHAB from 01/29/2023 in [facility] for [facility], [facility]ascular, & Lung [facility]  Picture  Your Plate Total Score on Admission 69      Picture Your Plate Scores: <16 Unhealthy dietary pattern with much room for improvement. 41-50 Dietary pattern unlikely to meet recommendations for good health and room for improvement. 51-60 More healthful dietary pattern, with some room for improvement.  >60 Healthy dietary pattern, although there may be some specific behaviors that could be improved.    Nutrition Goals Re-Evaluation:  Nutrition Goals Re-Evaluation     Row Name 01/17/23 1001 02/14/23 1108           Goals   Current Weight 242 lb 8.1 oz (110 kg) 244 lb 14.9 oz (111.1 kg)      Comment labs WNL labs WNL      Expected Outcome Azariya reports weight gain of ~80# over the last year and she is motivated to lose weight. Per transplant notes, she will begin to wean off prednisone. 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 Sharleen will benefit from participation in intensive cardiac rehab for nutrition, exercise, and lifestyle modification. Goals in  progress.Deovion continues to attend the [employer] education and nutrition series regularly. Chrissette reports weight gain of ~80# over the last year and she is motivated to lose weight. We have discussed multiple strategies for weight loss including benefits of high protein intake, reducing carbohydrate/fat intake, mindful eating, protein supplements, tracking intake, etc. She plans to discuss GLP-1s with her cardiologist. Per transplant notes, she will begin to wean off prednisone. 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 Pranshi will benefit from participation in intensive cardiac rehab for nutrition, exercise, and lifestyle modification.               Nutrition Goals Re-Evaluation:  Nutrition Goals Re-Evaluation     Row Name 01/17/23 1001 02/14/23 1108           Goals   Current Weight 242 lb 8.1 oz (110 kg) 244 lb 14.9 oz (111.1 kg)      Comment labs WNL labs  WNL      Expected Outcome Yenesis reports weight gain of ~80# over the last year and she is motivated to lose weight. Per transplant notes, she will begin to wean off prednisone. 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 Uniquia will benefit from participation in intensive cardiac rehab for nutrition, exercise, and lifestyle modification. Goals in progress.Tiaunna continues to attend the [employer] education and nutrition series regularly. Lilah reports weight gain of ~80# over the last year and she is motivated to lose weight. We have discussed multiple strategies for weight loss including benefits of high protein intake, reducing carbohydrate/fat intake, mindful eating, protein supplements, tracking intake, etc. She plans to discuss GLP-1s with her cardiologist. Per transplant notes, she will begin to wean off prednisone. 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 Skyah will benefit from participation in intensive cardiac rehab for nutrition, exercise, and lifestyle modification.               Nutrition Goals Discharge (Final Nutrition Goals Re-Evaluation):  Nutrition Goals Re-Evaluation - 02/14/23 1108       Goals   Current Weight 244 lb 14.9 oz (111.1 kg)    Comment labs WNL    Expected Outcome Goals in progress.Khila continues to attend the [employer] education and nutrition series regularly. Taron reports weight gain of ~80# over the last year and she is motivated to lose weight. We have discussed multiple strategies for weight loss including benefits of high protein intake, reducing carbohydrate/fat intake, mindful eating, protein supplements, tracking intake, etc. She plans to discuss GLP-1s with her cardiologist. Per transplant notes, she will begin to wean off prednisone. 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 Lativia will benefit from participation in intensive cardiac rehab for  nutrition, exercise, and lifestyle modification.             Psychosocial: Target Goals: Acknowledge presence or absence of significant depression and/or stress, maximize coping skills, provide positive support system. Participant is able to verbalize types and ability to use techniques and skills needed for reducing stress and depression.  Initial Review & Psychosocial Screening:  Initial Psych Review & Screening - 01/09/23 1448       Initial Review   Current issues with --   Pt is taking wellbutrin and Zoloft. The pt has hx of generalized anxiety. No voiced issues with depression or stress. PHQ -9 =1.     Family Dynamics   Good Support System? Yes    Comments Has mother for support  Barriers   Psychosocial barriers to participate in program There are no identifiable barriers or psychosocial needs.      Screening Interventions   Interventions Encouraged to exercise             Quality of Life Scores:  Quality of Life - 01/09/23 1437       Quality of Life   Select Quality of Life      Quality of Life Scores   Health/Function Pre 24.75 %    Socioeconomic Pre 21.36 %    Socioeconomic Post --    Socioeconomic % Change  --    Psych/Spiritual Pre 24 %    Family Pre 27.88 %    GLOBAL Pre 24.23 %            Scores of 19 and below usually indicate a poorer quality of life in these areas.  A difference of  2-3 points is a clinically meaningful difference.  A difference of 2-3 points in the total score of the Quality of Life Index has been associated with significant improvement in overall quality of life, self-image, physical symptoms, and general health in studies assessing change in quality of life.  PHQ-9: Review Flowsheet       01/09/2023 06/22/2021 03/30/2021  Depression screen PHQ 2/9  Decreased Interest 0 0 1  Down, Depressed, Hopeless 0 1 1  PHQ - 2 Score 0 1 2  Altered sleeping 0 0 0  Tired, decreased energy 0 0 1  Change in appetite 1 0 1  Feeling  bad or failure about yourself  0 0 0  Trouble concentrating 0 0 0  Moving slowly or fidgety/restless 0 0 0  Suicidal thoughts 0 0 0  PHQ-9 Score 1 1 4   Difficult doing work/chores Not difficult at all Not difficult at all Somewhat difficult   Interpretation of Total Score  Total Score Depression Severity:  1-4 = Minimal depression, 5-9 = Mild depression, 10-14 = Moderate depression, 15-19 = Moderately severe depression, 20-27 = Severe depression   Psychosocial Evaluation and Intervention:   Psychosocial Re-Evaluation:  Psychosocial Re-Evaluation     Row Name 01/17/23 1418 01/24/23 0920 02/15/23 0934 03/14/23 0958       Psychosocial Re-Evaluation   Current issues with None Identified None Identified None Identified None Identified    Comments -- -- No psychosocial needs identified No psychosocial needs identified    Interventions Encouraged to attend Cardiac Rehabilitation for the exercise Encouraged to attend Cardiac Rehabilitation for the exercise Encouraged to attend Cardiac Rehabilitation for the exercise Encouraged to attend Cardiac Rehabilitation for the exercise    Continue Psychosocial Services  No Follow up required No Follow up required No Follow up required No Follow up required             Psychosocial Discharge (Final Psychosocial Re-Evaluation):  Psychosocial Re-Evaluation - 03/14/23 0958       Psychosocial Re-Evaluation   Current issues with None Identified    Comments No psychosocial needs identified    Interventions Encouraged to attend Cardiac Rehabilitation for the exercise    Continue Psychosocial Services  No Follow up required             Vocational Rehabilitation: Provide vocational rehab assistance to qualifying candidates.   Vocational Rehab Evaluation & Intervention:  Vocational Rehab - 01/09/23 1440       Initial Vocational Rehab Evaluation & Intervention   Assessment shows need for Vocational Rehabilitation No      Vocational Rehab  Re-Evaulation   Comments Pt is a Camera operator. Pt was not at her job long enough to qualify for FMLA benefits. 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             Education: Education Goals: Education classes will be provided on a weekly basis, covering required topics. Participant will state understanding/return demonstration of topics presented.    Education     Row Name 01/17/23 1000     Education   Cardiac Education Topics Pritikin   Secondary school teacher School   Educator Dietitian   Weekly Topic Fast and Healthy Breakfasts   Instruction Review Code 1- Verbalizes Understanding   Class Start Time 0813   Class Stop Time 0856   Class Time Calculation (min) 43 min    Row Name 01/19/23 0900     Education   Cardiac Education Topics Pritikin   Select Core Videos     Core Videos   Educator Dietitian   Select Nutrition   Instruction Review Code 1- Verbalizes Understanding   Class Start Time 0815   Class Stop Time 0905   Class Time Calculation (min) 50 min    Row Name 01/22/23 0900     Education   Cardiac Education Topics Pritikin   Select Workshops     Workshops   Educator Exercise Physiologist   Select Psychosocial   Psychosocial Workshop Recognizing and Reducing Stress   Instruction Review Code 1- Verbalizes Understanding   Class Start Time [phone]   Class Stop Time 0853   Class Time Calculation (min) 41 min    Row Name 01/24/23 1000     Education   Cardiac Education Topics Pritikin   Secondary school teacher School   Educator Dietitian   Weekly Topic Personalizing Your Pritikin Plate   Instruction Review Code 1- Verbalizes Understanding   Class Start Time [phone]   Class Stop Time 0848   Class Time Calculation (min) 38 min    Row Name 01/26/23 1100     Education   Cardiac Education Topics Pritikin   Nurse, children's   Educator Exercise Physiologist   Select Psychosocial   Psychosocial  Healthy Minds, Bodies, Hearts   Instruction Review Code 1- Verbalizes Understanding   Class Start Time [phone]   Class Stop Time 0853   Class Time Calculation (min) 36 min    Row Name 01/29/23 1000     Education   Cardiac Education Topics Pritikin   Glass blower/designer Nutrition   Nutrition Workshop Label Reading   Instruction Review Code 1- Verbalizes Understanding   Class Start Time 0815   Class Stop Time 0905   Class Time Calculation (min) 50 min    Row Name 01/31/23 1100     Education   Cardiac Education Topics Pritikin   Orthoptist   Educator Dietitian   Weekly Topic [employer] Desserts   Instruction Review Code 1- Verbalizes Understanding   Class Start Time 0815   Class Stop Time 0856   Class Time Calculation (min) 41 min    Row Name 02/02/23 0900     Education   Cardiac Education Topics Pritikin   Select Core Videos     Core Videos   Educator Dietitian   Select Nutrition   Nutrition Other  Instruction Review Code 1- Verbalizes Understanding   Class Start Time 0815   Class Stop Time 0900   Class Time Calculation (min) 45 min    Row Name 02/05/23 0900     Education   Cardiac Education Topics Pritikin   Select Workshops     Workshops   Educator Exercise Physiologist   Select Exercise   Exercise Workshop Exercise Basics: Building Your Action Plan   Instruction Review Code 1- Verbalizes Understanding   Class Start Time 0815   Class Stop Time 0903   Class Time Calculation (min) 48 min    Row Name 02/14/23 0900     Education   Cardiac Education Topics Pritikin   Secondary school teacher School   Educator Dietitian   Weekly Topic Efficiency Cooking - Meals in a Snap   Instruction Review Code 1- Verbalizes Understanding   Class Start Time 0815   Class Stop Time 0849   Class Time Calculation (min) 34 min    Row Name 02/19/23 1600     Education   Cardiac Education  Topics Pritikin   Select Workshops     Workshops   Educator Exercise Physiologist   Select Psychosocial   Psychosocial Workshop Focused Goals, Sustainable Changes   Instruction Review Code 1- Verbalizes Understanding   Class Start Time 0810   Class Stop Time 0855   Class Time Calculation (min) 45 min    Row Name 02/23/23 0900     Education   Cardiac Education Topics --   Select --     Core Videos   Educator --   Select --   General Education --   Instruction Review Code --   Class Start Time --   Class Stop Time --   Class Time Calculation (min) --    Row Name 02/26/23 0800     Education   Cardiac Education Topics Pritikin   Select Core Videos     Core Videos   Educator Exercise Physiologist   Select Exercise Education   Exercise Education Biomechanial Limitations   Instruction Review Code 1- Verbalizes Understanding   Class Start Time [phone]   Class Stop Time 0855   Class Time Calculation (min) 41 min    Row Name 02/28/23 0900     Education   Cardiac Education Topics Pritikin   Orthoptist   Educator Dietitian   Weekly Topic Comforting Weekend Breakfasts   Instruction Review Code 1- Verbalizes Understanding   Class Start Time 0815   Class Stop Time [phone]   Class Time Calculation (min) 31 min    Row Name 03/02/23 0900     Education   Cardiac Education Topics Pritikin   Select Core Videos     Core Videos   Educator Dietitian   Select Nutrition   Nutrition Dining Out - Part 1   Instruction Review Code 1- Verbalizes Understanding   Class Start Time 0815   Class Stop Time 0849   Class Time Calculation (min) 34 min    Row Name 03/05/23 1000     Education   Cardiac Education Topics Pritikin   Select Core Videos     Core Videos   Educator Dietitian   Select Nutrition   Nutrition Facts on Fat   Instruction Review Code 1- Verbalizes Understanding   Class Start Time 0815   Class Stop Time 0852   Class Time Calculation  (min) 37 min  Row Name 03/07/23 1000     Education   Cardiac Education Topics Pritikin   Secondary school teacher School   Educator Dietitian   Weekly Topic Fast Evening Meals   Instruction Review Code 1- Verbalizes Understanding   Class Start Time 0815   Class Stop Time [phone]   Class Time Calculation (min) 37 min            Core Videos: Exercise    Move It!  Clinical staff conducted group or individual video education with verbal and written material and guidebook.  Patient learns the recommended Pritikin exercise program. Exercise with the goal of living a long, healthy life. Some of the health benefits of exercise include controlled diabetes, healthier blood pressure levels, improved cholesterol levels, improved heart and lung capacity, improved sleep, and better body composition. Everyone should speak with their doctor before starting or changing an exercise routine.  Biomechanical Limitations Clinical staff conducted group or individual video education with verbal and written material and guidebook.  Patient learns how biomechanical limitations can impact exercise and how we can mitigate and possibly overcome limitations to have an impactful and balanced exercise routine.  Body Composition Clinical staff conducted group or individual video education with verbal and written material and guidebook.  Patient learns that body composition (ratio of muscle mass to fat mass) is a key component to assessing overall fitness, rather than body weight alone. Increased fat mass, especially visceral belly fat, can put [place] at increased risk for metabolic syndrome, type 2 diabetes, heart disease, and even death. It is recommended to combine diet and exercise (cardiovascular and resistance training) to improve your body composition. Seek guidance from your physician and exercise physiologist before implementing an exercise routine.  Exercise Action Plan Clinical staff conducted  group or individual video education with verbal and written material and guidebook.  Patient learns the recommended strategies to achieve and enjoy long-term exercise adherence, including variety, self-motivation, self-efficacy, and positive decision making. Benefits of exercise include fitness, good health, weight management, more energy, better sleep, less stress, and overall well-being.  Medical   Heart Disease Risk Reduction Clinical staff conducted group or individual video education with verbal and written material and guidebook.  Patient learns our heart is our most vital organ as it circulates oxygen, nutrients, white blood cells, and hormones throughout the entire body, and carries waste away. Data supports a plant-based eating plan like the Pritikin Program for its effectiveness in slowing progression of and reversing heart disease. The video provides a number of recommendations to address heart disease.   Metabolic Syndrome and Belly Fat  Clinical staff conducted group or individual video education with verbal and written material and guidebook.  Patient learns what metabolic syndrome is, how it leads to heart disease, and how one can reverse it and keep it from coming back. You have metabolic syndrome if you have 3 of the following 5 criteria: abdominal obesity, high blood pressure, high triglycerides, low HDL cholesterol, and high blood sugar.  Hypertension and Heart Disease Clinical staff conducted group or individual video education with verbal and written material and guidebook.  Patient learns that high blood pressure, or hypertension, is very common in the [place]. Hypertension is largely due to excessive salt intake, but other important risk factors include being overweight, physical inactivity, drinking too much alcohol, smoking, and not eating enough potassium from fruits and vegetables. High blood pressure is a leading risk factor for heart attack, stroke, congestive heart  failure, dementia, kidney failure, and premature death. Long-term effects of excessive salt intake include stiffening of the arteries and thickening of heart muscle and organ damage. Recommendations include ways to reduce hypertension and the risk of heart disease.  Diseases of Our Time - Focusing on Diabetes Clinical staff conducted group or individual video education with verbal and written material and guidebook.  Patient learns why the best way to stop diseases of our time is prevention, through food and other lifestyle changes. Medicine (such as prescription pills and surgeries) is often only a Band-Aid on the problem, not a long-term solution. Most common diseases of our time include obesity, type 2 diabetes, hypertension, heart disease, and cancer. The Pritikin Program is recommended and has been proven to help reduce, reverse, and/or prevent the damaging effects of metabolic syndrome.  Nutrition   Overview of the Pritikin Eating Plan  Clinical staff conducted group or individual video education with verbal and written material and guidebook.  Patient learns about the Pritikin Eating Plan for disease risk reduction. The Pritikin Eating Plan emphasizes a wide variety of unrefined, minimally-processed carbohydrates, like fruits, vegetables, whole grains, and legumes. Go, Caution, and Stop food choices are explained. Plant-based and lean animal proteins are emphasized. Rationale provided for low sodium intake for blood pressure control, low added sugars for blood sugar stabilization, and low added fats and oils for coronary artery disease risk reduction and weight management.  Calorie Density  Clinical staff conducted group or individual video education with verbal and written material and guidebook.  Patient learns about calorie density and how it impacts the Pritikin Eating Plan. Knowing the characteristics of the food you choose will help you decide whether those foods will lead to weight gain or  weight loss, and whether you want to consume more or less of them. Weight loss is usually a side effect of the Pritikin Eating Plan because of its focus on low calorie-dense foods.  Label Reading  Clinical staff conducted group or individual video education with verbal and written material and guidebook.  Patient learns about the Pritikin recommended label reading guidelines and corresponding recommendations regarding calorie density, added sugars, sodium content, and whole grains.  Dining Out - Part 1  Clinical staff conducted group or individual video education with verbal and written material and guidebook.  Patient learns that restaurant meals can be sabotaging because they can be so high in calories, fat, sodium, and/or sugar. Patient learns recommended strategies on how to positively address this and avoid unhealthy pitfalls.  Facts on Fats  Clinical staff conducted group or individual video education with verbal and written material and guidebook.  Patient learns that lifestyle modifications can be just as effective, if not more so, as many medications for lowering your risk of heart disease. A Pritikin lifestyle can help to reduce your risk of inflammation and atherosclerosis (cholesterol build-up, or plaque, in the artery walls). Lifestyle interventions such as dietary choices and physical activity address the cause of atherosclerosis. A review of the types of fats and their impact on blood cholesterol levels, along with dietary recommendations to reduce fat intake is also included.  Nutrition Action Plan  Clinical staff conducted group or individual video education with verbal and written material and guidebook.  Patient learns how to incorporate Pritikin recommendations into their lifestyle. Recommendations include planning and keeping personal health goals in mind as an important part of their success.  Healthy Mind-Set    Healthy Minds, Bodies, Hearts  Clinical staff conducted group  or  individual video education with verbal and written material and guidebook.  Patient learns how to identify when they are stressed. Video will discuss the impact of that stress, as well as the many benefits of stress management. Patient will also be introduced to stress management techniques. The way we think, act, and feel has an impact on our hearts.  How Our Thoughts Can Heal Our Hearts  Clinical staff conducted group or individual video education with verbal and written material and guidebook.  Patient learns that negative thoughts can cause depression and anxiety. This can result in negative lifestyle behavior and serious health problems. Cognitive behavioral therapy is an effective method to help control our thoughts in order to change and improve our emotional outlook.  Additional Videos:  Exercise    Improving Performance  Clinical staff conducted group or individual video education with verbal and written material and guidebook.  Patient learns to use a non-linear approach by alternating intensity levels and lengths of time spent exercising to help burn more calories and lose more body fat. Cardiovascular exercise helps improve heart health, metabolism, hormonal balance, blood sugar control, and recovery from fatigue. Resistance training improves strength, endurance, balance, coordination, reaction time, metabolism, and muscle mass. Flexibility exercise improves circulation, posture, and balance. Seek guidance from your physician and exercise physiologist before implementing an exercise routine and learn your capabilities and proper form for all exercise.  Introduction to Yoga  Clinical staff conducted group or individual video education with verbal and written material and guidebook.  Patient learns about yoga, a discipline of the coming together of mind, breath, and body. The benefits of yoga include improved flexibility, improved range of motion, better posture and core strength,  increased lung function, weight loss, and positive self-image. Yoga's heart health benefits include lowered blood pressure, healthier heart rate, decreased cholesterol and triglyceride levels, improved immune function, and reduced stress. Seek guidance from your physician and exercise physiologist before implementing an exercise routine and learn your capabilities and proper form for all exercise.  Medical   Aging: Enhancing Your Quality of Life  Clinical staff conducted group or individual video education with verbal and written material and guidebook.  Patient learns key strategies and recommendations to stay in good physical health and enhance quality of life, such as prevention strategies, having an advocate, securing a Health Care Proxy and Power of Attorney, and keeping a list of medications and system for tracking them. It also discusses how to avoid risk for bone loss.  Biology of Weight Control  Clinical staff conducted group or individual video education with verbal and written material and guidebook.  Patient learns that weight gain occurs because we consume more calories than we burn (eating more, moving less). Even if your body weight is normal, you may have higher ratios of fat compared to muscle mass. Too much body fat puts you at increased risk for cardiovascular disease, heart attack, stroke, type 2 diabetes, and obesity-related cancers. In addition to exercise, following the Pritikin Eating Plan can help reduce your risk.  Decoding Lab Results  Clinical staff conducted group or individual video education with verbal and written material and guidebook.  Patient learns that lab test reflects one measurement whose values change over time and are influenced by many factors, including medication, stress, sleep, exercise, food, hydration, pre-existing medical conditions, and more. It is recommended to use the knowledge from this video to become more involved with your lab results and  evaluate your numbers to speak with your doctor.  Diseases of Our Time - Overview  Clinical staff conducted group or individual video education with verbal and written material and guidebook.  Patient learns that according to the CDC, 50% to 70% of chronic diseases (such as obesity, type 2 diabetes, elevated lipids, hypertension, and heart disease) are avoidable through lifestyle improvements including healthier food choices, listening to satiety cues, and increased physical activity.  Sleep Disorders Clinical staff conducted group or individual video education with verbal and written material and guidebook.  Patient learns how good quality and duration of sleep are important to overall health and well-being. Patient also learns about sleep disorders and how they impact health along with recommendations to address them, including discussing with a physician.  Nutrition  Dining Out - Part 2 Clinical staff conducted group or individual video education with verbal and written material and guidebook.  Patient learns how to plan ahead and communicate in order to maximize their dining experience in a healthy and nutritious manner. Included are recommended food choices based on the type of restaurant the patient is visiting.   Fueling a Banker conducted group or individual video education with verbal and written material and guidebook.  There is a strong connection between our food choices and our health. Diseases like obesity and type 2 diabetes are very prevalent and are in large-part due to lifestyle choices. The Pritikin Eating Plan provides plenty of food and hunger-curbing satisfaction. It is easy to follow, affordable, and helps reduce health risks.  Menu Workshop  Clinical staff conducted group or individual video education with verbal and written material and guidebook.  Patient learns that restaurant meals can sabotage health goals because they are often packed with  calories, fat, sodium, and sugar. Recommendations include strategies to plan ahead and to communicate with the manager, chef, or server to help order a healthier meal.  Planning Your Eating Strategy  Clinical staff conducted group or individual video education with verbal and written material and guidebook.  Patient learns about the Pritikin Eating Plan and its benefit of reducing the risk of disease. The Pritikin Eating Plan does not focus on calories. Instead, it emphasizes high-quality, nutrient-rich foods. By knowing the characteristics of the foods, we choose, we can determine their calorie density and make informed decisions.  Targeting Your Nutrition Priorities  Clinical staff conducted group or individual video education with verbal and written material and guidebook.  Patient learns that lifestyle habits have a tremendous impact on disease risk and progression. This video provides eating and physical activity recommendations based on your personal health goals, such as reducing LDL cholesterol, losing weight, preventing or controlling type 2 diabetes, and reducing high blood pressure.  Vitamins and Minerals  Clinical staff conducted group or individual video education with verbal and written material and guidebook.  Patient learns different ways to obtain key vitamins and minerals, including through a recommended healthy diet. It is important to discuss all supplements you take with your doctor.   Healthy Mind-Set    Smoking Cessation  Clinical staff conducted group or individual video education with verbal and written material and guidebook.  Patient learns that cigarette smoking and tobacco addiction pose a serious health risk which affects millions of people. Stopping smoking will significantly reduce the risk of heart disease, lung disease, and many forms of cancer. Recommended strategies for quitting are covered, including working with your doctor to develop a successful  plan.  Culinary   Becoming a Set designer conducted group or  individual video education with verbal and written material and guidebook.  Patient learns that cooking at home can be healthy, cost-effective, quick, and puts them in control. Keys to cooking healthy recipes will include looking at your recipe, assessing your equipment needs, planning ahead, making it simple, choosing cost-effective seasonal ingredients, and limiting the use of added fats, salts, and sugars.  Cooking - Breakfast and Snacks  Clinical staff conducted group or individual video education with verbal and written material and guidebook.  Patient learns how important breakfast is to satiety and nutrition through the entire day. Recommendations include key foods to eat during breakfast to help stabilize blood sugar levels and to prevent overeating at meals later in the day. Planning ahead is also a key component.  Cooking - Educational psychologist conducted group or individual video education with verbal and written material and guidebook.  Patient learns eating strategies to improve overall health, including an approach to cook more at home. Recommendations include thinking of animal protein as a side on your plate rather than center stage and focusing instead on lower calorie dense options like vegetables, fruits, whole grains, and plant-based proteins, such as beans. Making sauces in large quantities to freeze for later and leaving the skin on your vegetables are also recommended to maximize your experience.  Cooking - Healthy Salads and Dressing Clinical staff conducted group or individual video education with verbal and written material and guidebook.  Patient learns that vegetables, fruits, whole grains, and legumes are the foundations of the Pritikin Eating Plan. Recommendations include how to incorporate each of these in flavorful and healthy salads, and how to create homemade salad dressings.  Proper handling of ingredients is also covered. Cooking - Soups and [employer] - Soups and Desserts Clinical staff conducted group or individual video education with verbal and written material and guidebook.  Patient learns that Pritikin soups and desserts make for easy, nutritious, and delicious snacks and meal components that are low in sodium, fat, sugar, and calorie density, while high in vitamins, minerals, and filling fiber. Recommendations include simple and healthy ideas for soups and desserts.   Overview     The Pritikin Solution Program Overview Clinical staff conducted group or individual video education with verbal and written material and guidebook.  Patient learns that the results of the Pritikin Program have been documented in more than 100 articles published in peer-reviewed journals, and the benefits include reducing risk factors for (and, in some cases, even reversing) high cholesterol, high blood pressure, type 2 diabetes, obesity, and more! An overview of the three key pillars of the Pritikin Program will be covered: eating well, doing regular exercise, and having a healthy mind-set.  WORKSHOPS  Exercise: Exercise Basics: Building Your Action Plan Clinical staff led group instruction and group discussion with PowerPoint presentation and patient guidebook. To enhance the learning environment the use of posters, models and videos may be added. At the conclusion of this workshop, patients will comprehend the difference between physical activity and exercise, as well as the benefits of incorporating both, into their routine. Patients will understand the FITT (Frequency, Intensity, Time, and Type) principle and how to use it to build an exercise action plan. In addition, safety concerns and other considerations for exercise and cardiac rehab will be addressed by the presenter. The purpose of this lesson is to promote a comprehensive and effective weekly exercise routine in  order to improve patients' overall level of fitness.   Managing Heart Disease:  Your Path to a Healthier Heart Clinical staff led group instruction and group discussion with PowerPoint presentation and patient guidebook. To enhance the learning environment the use of posters, models and videos may be added.At the conclusion of this workshop, patients will understand the anatomy and physiology of the heart. Additionally, they will understand how Pritikin's three pillars impact the risk factors, the progression, and the management of heart disease.  The purpose of this lesson is to provide a high-level overview of the heart, heart disease, and how the Pritikin lifestyle positively impacts risk factors.  Exercise Biomechanics Clinical staff led group instruction and group discussion with PowerPoint presentation and patient guidebook. To enhance the learning environment the use of posters, models and videos may be added. Patients will learn how the structural parts of their bodies function and how these functions impact their daily activities, movement, and exercise. Patients will learn how to promote a neutral spine, learn how to manage pain, and identify ways to improve their physical movement in order to promote healthy living. The purpose of this lesson is to expose patients to common physical limitations that impact physical activity. Participants will learn practical ways to adapt and manage aches and pains, and to minimize their effect on regular exercise. Patients will learn how to maintain good posture while sitting, walking, and lifting.  Balance Training and Fall Prevention  Clinical staff led group instruction and group discussion with PowerPoint presentation and patient guidebook. To enhance the learning environment the use of posters, models and videos may be added. At the conclusion of this workshop, patients will understand the importance of their sensorimotor skills (vision,  proprioception, and the vestibular system) in maintaining their ability to balance as they age. Patients will apply a variety of balancing exercises that are appropriate for their current level of function. Patients will understand the common causes for poor balance, possible solutions to these problems, and ways to modify their physical environment in order to minimize their fall risk. The purpose of this lesson is to teach patients about the importance of maintaining balance as they age and ways to minimize their risk of falling.  WORKSHOPS   Nutrition:  Fueling a Ship broker led group instruction and group discussion with PowerPoint presentation and patient guidebook. To enhance the learning environment the use of posters, models and videos may be added. Patients will review the foundational principles of the Pritikin Eating Plan and understand what constitutes a serving size in each of the food groups. Patients will also learn Pritikin-friendly foods that are better choices when away from home and review make-ahead meal and snack options. Calorie density will be reviewed and applied to three nutrition priorities: weight maintenance, weight loss, and weight gain. The purpose of this lesson is to reinforce (in a group setting) the key concepts around what patients are recommended to eat and how to apply these guidelines when away from home by planning and selecting Pritikin-friendly options. Patients will understand how calorie density may be adjusted for different weight management goals.  Mindful Eating  Clinical staff led group instruction and group discussion with PowerPoint presentation and patient guidebook. To enhance the learning environment the use of posters, models and videos may be added. Patients will briefly review the concepts of the Pritikin Eating Plan and the importance of low-calorie dense foods. The concept of mindful eating will be introduced as well as the  importance of paying attention to internal hunger signals. Triggers for non-hunger eating and techniques for dealing  with triggers will be explored. The purpose of this lesson is to provide patients with the opportunity to review the basic principles of the [place], discuss the value of eating mindfully and how to measure internal cues of hunger and fullness using the Hunger Scale. Patients will also discuss reasons for non-hunger eating and learn strategies to use for controlling emotional eating.  Targeting Your Nutrition Priorities Clinical staff led group instruction and group discussion with PowerPoint presentation and patient guidebook. To enhance the learning environment the use of posters, models and videos may be added. Patients will learn how to determine their genetic susceptibility to disease by reviewing their family history. Patients will gain insight into the importance of diet as part of an overall healthy lifestyle in mitigating the impact of genetics and other environmental insults. The purpose of this lesson is to provide patients with the opportunity to assess their personal nutrition priorities by looking at their family history, their own health history and current risk factors. Patients will also be able to discuss ways of prioritizing and modifying the [place] for their highest risk areas  Menu  Clinical staff led group instruction and group discussion with PowerPoint presentation and patient guidebook. To enhance the learning environment the use of posters, models and videos may be added. Using menus brought in from [employer], or printed from [employer], patients will apply the [place] dining out guidelines that were presented in the [employer] video. Patients will also be able to practice these guidelines in a variety of provided scenarios. The purpose of this lesson is to provide patients with the opportunity to practice  hands-on learning of the [place] with actual menus and practice scenarios.  Label Reading Clinical staff led group instruction and group discussion with PowerPoint presentation and patient guidebook. To enhance the learning environment the use of posters, models and videos may be added. Patients will review and discuss the Pritikin label reading guidelines presented in Pritikin's Label Reading Educational series video. Using fool labels brought in from local grocery stores and markets, patients will apply the label reading guidelines and determine if the packaged food meet the Pritikin guidelines. The purpose of this lesson is to provide patients with the opportunity to review, discuss, and practice hands-on learning of the Pritikin Label Reading guidelines with actual packaged food labels. [place] Workshops are designed to teach patients ways to prepare quick, simple, and affordable recipes at home. The importance of nutrition's role in chronic disease risk reduction is reflected in its emphasis in the overall Pritikin program. By learning how to prepare essential core Pritikin Eating Plan recipes, patients will increase control over what they eat; be able to customize the flavor of foods without the use of added salt, sugar, or fat; and improve the quality of the food they consume. By learning a set of core recipes which are easily assembled, quickly prepared, and affordable, patients are more likely to prepare more healthy foods at home. These workshops focus on convenient breakfasts, simple entres, side dishes, and desserts which can be prepared with minimal effort and are consistent with nutrition recommendations for cardiovascular risk reduction. Cooking [employer] are taught by a Engineer, materials (RD) who has been trained by the [employer]. The chef or RD has a clear understanding of the importance of minimizing -  if not completely eliminating - added fat, sugar, and sodium in recipes. Throughout the  series of Cooking School Workshop sessions, patients will learn about healthy ingredients and efficient methods of cooking to build confidence in their capability to prepare    Cooking School weekly topics:  Adding Flavor- Sodium-Free  Fast and Healthy Breakfasts  Powerhouse Plant-Based Proteins  Satisfying Salads and Dressings  Simple Sides and Sauces  International Cuisine-Spotlight on the [employer] Zones  Delicious Desserts  Savory Soups  [employer] - Meals in a Astronomer Appetizers and Snacks  Comforting Weekend Breakfasts  One-Pot Wonders   Fast Evening Meals  Landscape architect Your Pritikin Plate  WORKSHOPS   Healthy Mindset (Psychosocial):  Focused Goals, Sustainable Changes Clinical staff led group instruction and group discussion with PowerPoint presentation and patient guidebook. To enhance the learning environment the use of posters, models and videos may be added. Patients will be able to apply effective goal setting strategies to establish at least one personal goal, and then take consistent, meaningful action toward that goal. They will learn to identify common barriers to achieving personal goals and develop strategies to overcome them. Patients will also gain an understanding of how our mind-set can impact our ability to achieve goals and the importance of cultivating a positive and growth-oriented mind-set. The purpose of this lesson is to provide patients with a deeper understanding of how to set and achieve personal goals, as well as the tools and strategies needed to overcome common obstacles which may arise along the way.  From Head to Heart: The Power of a Healthy Outlook  Clinical staff led group instruction and group discussion with PowerPoint presentation and patient guidebook. To enhance the learning environment the use of posters, models and videos may be  added. Patients will be able to recognize and describe the impact of emotions and mood on physical health. They will discover the importance of self-care and explore self-care practices which may work for them. Patients will also learn how to utilize the 4 C's to cultivate a healthier outlook and better manage stress and challenges. The purpose of this lesson is to demonstrate to patients how a healthy outlook is an essential part of maintaining good health, especially as they continue their cardiac rehab journey.  Healthy Sleep for a Healthy Heart Clinical staff led group instruction and group discussion with PowerPoint presentation and patient guidebook. To enhance the learning environment the use of posters, models and videos may be added. At the conclusion of this workshop, patients will be able to demonstrate knowledge of the importance of sleep to overall health, well-being, and quality of life. They will understand the symptoms of, and treatments for, common sleep disorders. Patients will also be able to identify daytime and nighttime behaviors which impact sleep, and they will be able to apply these tools to help manage sleep-related challenges. The purpose of this lesson is to provide patients with a general overview of sleep and outline the importance of quality sleep. Patients will learn about a few of the most common sleep disorders. Patients will also be introduced to the concept of "sleep hygiene," and discover ways to self-manage certain sleeping problems through simple daily behavior changes. Finally, the workshop will motivate patients by clarifying the links between quality sleep and their goals of heart-healthy living.   Recognizing and Reducing Stress Clinical staff led group instruction and group discussion with PowerPoint presentation and patient guidebook. To enhance the learning environment the use of posters, models and videos may be added. At the conclusion of this workshop, patients  will  be able to understand the types of stress reactions, differentiate between acute and chronic stress, and recognize the impact that chronic stress has on their health. They will also be able to apply different coping mechanisms, such as reframing negative self-talk. Patients will have the opportunity to practice a variety of stress management techniques, such as deep abdominal breathing, progressive muscle relaxation, and/or guided imagery.  The purpose of this lesson is to educate patients on the role of stress in their lives and to provide healthy techniques for coping with it.  Learning Barriers/Preferences:  Learning Barriers/Preferences - 01/09/23 1439       Learning Barriers/Preferences   Learning Barriers None    Learning Preferences Written Material;Computer/Internet;Skilled Demonstration;Individual Instruction;Group Instruction             Education Topics:  Knowledge Questionnaire Score:  Knowledge Questionnaire Score - 01/09/23 1439       Knowledge Questionnaire Score   Pre Score 22/24             Core Components/Risk Factors/Patient Goals at Admission:  Personal Goals and Risk Factors at Admission - 01/09/23 1525       Core Components/Risk Factors/Patient Goals on Admission    Weight Management Yes;Weight Loss    Intervention Weight Management: Develop a combined nutrition and exercise program designed to reach desired caloric intake, while maintaining appropriate intake of nutrient and fiber, sodium and fats, and appropriate energy expenditure required for the weight goal.;Weight Management: Provide education and appropriate resources to help participant work on and attain dietary goals.;Weight Management/Obesity: Establish reasonable short term and long term weight goals.;Obesity: Provide education and appropriate resources to help participant work on and attain dietary goals.    Admit Weight 240 lb 4.8 oz (109 kg)    Expected Outcomes Short Term: Continue to  assess and modify interventions until short term weight is achieved;Long Term: Adherence to nutrition and physical activity/exercise program aimed toward attainment of established weight goal;Weight Loss: Understanding of general recommendations for a balanced deficit meal plan, which promotes 1-2 lb weight loss per week and includes a negative energy balance of [phone] kcal/d;Understanding recommendations for meals to include 15-35% energy as protein, 25-35% energy from fat, 35-60% energy from carbohydrates, less than 200mg  of dietary cholesterol, 20-35 gm of total fiber daily;Understanding of distribution of calorie intake throughout the day with the consumption of 4-5 meals/snacks    Hypertension Yes    Intervention Provide education on lifestyle modifcations including regular physical activity/exercise, weight management, moderate sodium restriction and increased consumption of fresh fruit, vegetables, and low fat dairy, alcohol moderation, and smoking cessation.;Monitor prescription use compliance.    Expected Outcomes Short Term: Continued assessment and intervention until BP is < 140/75mm HG in hypertensive participants. < 130/6mm HG in hypertensive participants with diabetes, heart failure or chronic kidney disease.;Long Term: Maintenance of blood pressure at goal levels.             Core Components/Risk Factors/Patient Goals Review:   Goals and Risk Factor Review     Row Name 01/17/23 1418 01/24/23 0925 02/15/23 0935 03/14/23 1000       Core Components/Risk Factors/Patient Goals Review   Personal Goals Review Weight Management/Obesity;Hypertension Weight Management/Obesity;Hypertension Weight Management/Obesity;Hypertension Weight Management/Obesity;Hypertension    Review Tamesa started intensive cardiac rehab on 01/17/23 and did well with exercise. Vital signs stable Corren started intensive cardiac rehab on 01/17/23 and is off to a good start to exercise. Vital signs have been  stable. Jaqueline's weight was up on Monday. Weight  today 110.3 kg. which is down. Jaqueline did notify the transplant coordinator. No complaints or symptoms. Melessa is doing well with exercise at  intensive cardiac rehab . Vital signs have been stable. Jaqueline has gained 1.4 kg since starting the program, Verlinda is doing well with exercise at  intensive cardiac rehab . Vital signs have been stable. Gaspar Cola has gained 3.4  kg since starting the program, Jaqueline will complete cardiac rehab on 03/23/23    Expected Outcomes Jaqueline will continue to participate in intensive cardiac rehab for exercise, nutrtion and lifestyle modifications Jaqueline will continue to participate in intensive cardiac rehab for exercise, nutrtion and lifestyle modifications Jaqueline will continue to participate in intensive cardiac rehab for exercise, nutrtion and lifestyle modifications Jaqueline will continue to participate in intensive cardiac rehab for exercise, nutrtion and lifestyle modifications             Core Components/Risk Factors/Patient Goals at Discharge (Final Review):   Goals and Risk Factor Review - 03/14/23 1000       Core Components/Risk Factors/Patient Goals Review   Personal Goals Review Weight Management/Obesity;Hypertension    Review Periann is doing well with exercise at  intensive cardiac rehab . Vital signs have been stable. Jaqueline has gained 3.4  kg since starting the program, Gaspar Cola will complete cardiac rehab on 03/23/23    Expected Outcomes Jaqueline will continue to participate in intensive cardiac rehab for exercise, nutrtion and lifestyle modifications             ITP Comments:  ITP Comments     Row Name 01/09/23 1359 01/17/23 1412 01/24/23 0919 02/15/23 0933 03/14/23 0957   ITP Comments Armanda Magic, MD - Medical Director. Introduction to the Pritikin Education Program/Intensive Cardiac Rehab. Initial orientation packet reviewed with the patient 30 Day  ITP Review. Jaqueline started intensive cardiac rehab on 01/17/23 and did well with exercise 30 Day ITP Review. Jaqueline started intensive cardiac rehab on 01/17/23 and is off to a good start to exercise 30 Day ITP Review. Gaspar Cola has good attendance and participation in intensive cardiac rehab 30 Day ITP Review. Gaspar Cola continues to have  good attendance and participation in intensive cardiac rehab. Gaspar Cola will complete intensive cardiac rehab on 03/23/23            Comments: See ITP comments.Thayer Headings RN BSN

## 2023-03-16 ENCOUNTER — Encounter (HOSPITAL_COMMUNITY): Payer: BC Managed Care – PPO

## 2023-03-19 ENCOUNTER — Telehealth (HOSPITAL_COMMUNITY): Payer: Self-pay

## 2023-03-19 ENCOUNTER — Encounter (HOSPITAL_COMMUNITY): Payer: BC Managed Care – PPO

## 2023-03-21 ENCOUNTER — Encounter (HOSPITAL_COMMUNITY): Payer: BC Managed Care – PPO

## 2023-03-21 ENCOUNTER — Telehealth (HOSPITAL_COMMUNITY): Payer: Self-pay

## 2023-03-23 ENCOUNTER — Encounter (HOSPITAL_COMMUNITY): Payer: BC Managed Care – PPO

## 2023-03-26 ENCOUNTER — Encounter (HOSPITAL_COMMUNITY)
Admission: RE | Admit: 2023-03-26 | Discharge: 2023-03-26 | Disposition: A | Discharge status: Home / Self Care | Payer: BC Managed Care – PPO | Source: Ambulatory Visit | Attending: Cardiology | Admitting: Cardiology

## 2023-03-26 DIAGNOSIS — I5022 Chronic systolic (congestive) heart failure: Secondary | ICD-10-CM

## 2023-03-26 DIAGNOSIS — Z941 Heart transplant status: Secondary | ICD-10-CM | POA: Diagnosis not present

## 2023-03-26 NOTE — Progress Notes (Signed)
Daily Session Note  Patient Details  Name: [name] MRN: [id number] Date of Birth: 09/23/1996 Referring Provider:   Flowsheet Row INTENSIVE CARDIAC REHAB ORIENT from 01/09/2023 in [facility]  Referring Provider Dr. Edwena Blow Armanda Magic, MD covering)       Encounter Date: 03/26/2023  Check In:  Session Check In - 03/26/23 0931       Check-In   Supervising physician immediately available to respond to emergencies [facility] MD immediately available    Physician(s) Bernadene Person NP    Location [facility]    Staff Present Lorin Picket, MS, ACSM-CEP, CCRP, Exercise Physiologist;Olinty Peggye Pitt, MS, ACSM-CEP, Exercise Physiologist;Mary Gerre Scull, RN, Sherron Flemings, RN, MSN;Jetta Walker BS, ACSM-CEP, Exercise Physiologist    Virtual Visit No    Medication changes reported     No    Fall or balance concerns reported    No    Tobacco Cessation No Change    Warm-up and Cool-down Performed as group-led instruction    Resistance Training Performed Yes    VAD Patient? No    PAD/SET Patient? No      Pain Assessment   Currently in Pain? No/denies    Pain Score 0-No pain    Multiple Pain Sites No             Capillary Blood Glucose: No results found for this or any previous visit (from the past 24 hour(s)).    Social History   Tobacco Use  Smoking Status Never  Smokeless Tobacco Never    Goals Met:  Independence with exercise equipment Exercise tolerated well No report of concerns or symptoms today Strength training completed today  Goals Unmet:  Not Applicable  Comments: No issues.   Dr. Armanda Magic is Medical Director for Cardiac Rehab at [facility]

## 2023-03-28 ENCOUNTER — Encounter (HOSPITAL_COMMUNITY)
Admission: RE | Admit: 2023-03-28 | Discharge: 2023-03-28 | Disposition: A | Discharge status: Home / Self Care | Payer: BC Managed Care – PPO | Source: Ambulatory Visit | Attending: Cardiology | Admitting: Cardiology

## 2023-03-28 DIAGNOSIS — I5022 Chronic systolic (congestive) heart failure: Secondary | ICD-10-CM

## 2023-03-28 DIAGNOSIS — Z941 Heart transplant status: Secondary | ICD-10-CM

## 2023-03-30 ENCOUNTER — Encounter (HOSPITAL_COMMUNITY): Payer: BC Managed Care – PPO

## 2023-04-04 ENCOUNTER — Encounter (HOSPITAL_COMMUNITY)
Admission: RE | Admit: 2023-04-04 | Discharge: 2023-04-04 | Disposition: A | Discharge status: Home / Self Care | Payer: BC Managed Care – PPO | Source: Ambulatory Visit | Attending: Cardiology | Admitting: Cardiology

## 2023-04-04 DIAGNOSIS — Z941 Heart transplant status: Secondary | ICD-10-CM

## 2023-04-04 DIAGNOSIS — I5022 Chronic systolic (congestive) heart failure: Secondary | ICD-10-CM

## 2023-04-06 ENCOUNTER — Encounter (HOSPITAL_COMMUNITY)
Admission: RE | Admit: 2023-04-06 | Discharge: 2023-04-06 | Disposition: A | Discharge status: Home / Self Care | Payer: BC Managed Care – PPO | Source: Ambulatory Visit | Attending: Cardiology | Admitting: Cardiology

## 2023-04-06 DIAGNOSIS — I5022 Chronic systolic (congestive) heart failure: Secondary | ICD-10-CM

## 2023-04-06 DIAGNOSIS — Z941 Heart transplant status: Secondary | ICD-10-CM

## 2023-04-09 ENCOUNTER — Encounter (HOSPITAL_COMMUNITY)
Admission: RE | Admit: 2023-04-09 | Discharge: 2023-04-09 | Disposition: A | Discharge status: Home / Self Care | Payer: BC Managed Care – PPO | Source: Ambulatory Visit | Attending: Cardiology | Admitting: Cardiology

## 2023-04-09 DIAGNOSIS — I5022 Chronic systolic (congestive) heart failure: Secondary | ICD-10-CM | POA: Insufficient documentation

## 2023-04-09 DIAGNOSIS — Z941 Heart transplant status: Secondary | ICD-10-CM | POA: Insufficient documentation

## 2023-04-10 NOTE — Progress Notes (Signed)
Cardiac Individual Treatment Plan  Patient Details  Name: [name] MRN: [id number] Date of Birth: 08-Jan-1996 Referring Provider:   Flowsheet Row INTENSIVE CARDIAC REHAB ORIENT from 01/09/2023 in [facility] for Heart, Vascular, & Lung Health  Referring Provider Dr. Edwena Blow (Armanda Magic, MD covering)       Initial Encounter Date:  Flowsheet Row INTENSIVE CARDIAC REHAB ORIENT from 01/09/2023 in [facility] for Heart, Vascular, & Lung Health  Date 01/09/23       Visit Diagnosis: Status post heart transplant (HCC)  10/31/22 Heart transplant at [facility]  Heart failure, chronic systolic (HCC)  Patient's Home Medications on Admission:  Current Outpatient Medications:    acetaminophen (TYLENOL) 325 MG tablet, Take 650 mg by mouth every 6 (six) hours as needed., Disp: , Rfl:    ASPIRIN ADULT LOW DOSE 81 MG tablet, Take 81 mg by mouth daily., Disp: , Rfl:    buPROPion (WELLBUTRIN SR) 150 MG 12 hr tablet, Take 150 mg by mouth daily., Disp: , Rfl:    Calcium Carb-Cholecalciferol 600-10 MG-MCG TABS, Take 600 mcg by mouth in the morning and at bedtime., Disp: , Rfl:    diazepam (VALIUM) 5 MG tablet, Take 5 mg by mouth as needed. Prior to biopsy, Disp: , Rfl:    ELIQUIS 5 MG TABS tablet, Take 5 mg by mouth 2 (two) times daily., Disp: , Rfl:    fluconazole (DIFLUCAN) 150 MG tablet, Take 1 tablet (150 mg total) by mouth as needed. (Patient not taking: Reported on 01/09/2023), Disp: 30 tablet, Rfl: 0   furosemide (LASIX) 40 MG tablet, Take 1 tablet (40 mg total) by mouth daily., Disp: 30 tablet, Rfl: 6   gabapentin (NEURONTIN) 400 MG capsule, Take 400 mg by mouth 3 (three) times daily., Disp: , Rfl:    hydrOXYzine (VISTARIL) 25 MG capsule, Take 25 mg by mouth 2 (two) times daily as needed for anxiety., Disp: , Rfl:    ketoconazole (NIZORAL) 2 % cream, Apply 1 Application topically daily as needed for irritation., Disp: , Rfl:    ketoconazole  (NIZORAL) 2 % shampoo, Apply 1 Application topically 2 (two) times a week. As needed, Disp: , Rfl:    methocarbamol (ROBAXIN) 500 MG tablet, Take 500 mg by mouth 3 (three) times daily as needed for muscle spasms., Disp: , Rfl:    mycophenolate (CELLCEPT) 250 MG capsule, Take 1,000 mg by mouth 2 (two) times daily., Disp: , Rfl:    nystatin (MYCOSTATIN) 100000 UNIT/ML suspension, Take 5 mLs by mouth 4 (four) times daily., Disp: , Rfl:    pantoprazole (PROTONIX) 40 MG tablet, Take 40 mg by mouth daily., Disp: , Rfl:    pravastatin (PRAVACHOL) 40 MG tablet, Take 40 mg by mouth daily., Disp: , Rfl:    predniSONE (DELTASONE) 5 MG tablet, Take 7.5 mg by mouth daily with breakfast. Take as directed, Disp: , Rfl:    Semaglutide,0.25 or 0.5MG /DOS, 2 MG/3ML SOPN, Inject 0.25 mg into the skin once a week., Disp: , Rfl:    sertraline (ZOLOFT) 100 MG tablet, Take 100 mg by mouth daily., Disp: , Rfl:    spironolactone (ALDACTONE) 25 MG tablet, Take 0.5 tablets (12.5 mg total) by mouth at bedtime. NEEDS FOLLOW UP APPOINTMENT FOR ANYMORE REFILLS, Disp: 45 tablet, Rfl: 0   sulfamethoxazole-trimethoprim (BACTRIM) 400-80 MG tablet, Take 1 tablet by mouth daily., Disp: , Rfl:    tacrolimus (PROGRAF) 1 MG capsule, Take 1 mg by mouth 2 (two) times  daily. 4 mg at 0900 3 mg at 2100, Disp: , Rfl:    valACYclovir (VALTREX) 1000 MG tablet, Take 1,000 mg by mouth as needed for rash. (Patient not taking: Reported on 01/09/2023), Disp: , Rfl:    valGANciclovir (VALCYTE) 450 MG tablet, Take 900 mg by mouth daily., Disp: , Rfl:   Past Medical History: Past Medical History:  Diagnosis Date   Lactose intolerance    NICM (nonischemic cardiomyopathy) (HCC)    PVC's (premature ventricular contractions)    Torsades de pointes (HCC)     Tobacco Use: Social History   Tobacco Use  Smoking Status Never  Smokeless Tobacco Never    Labs: Review Flowsheet       Latest Ref Rng & Units 09/01/2022  Labs for ITP Cardiac and  Pulmonary Rehab  Bicarbonate 20.0 - 28.0 mmol/L 21.9  19.0  21.9   TCO2 22 - 32 mmol/L 23  20  23    Acid-base deficit 0.0 - 2.0 mmol/L 3.0  5.0  3.0   O2 Saturation % 68  66  66     Capillary Blood Glucose: No results found for: "GLUCAP"   Exercise Target Goals: Exercise Program Goal: Individual exercise prescription set using results from initial 6 min walk test and THRR while considering  patient's activity barriers and safety.   Exercise Prescription Goal: Initial exercise prescription builds to 30-45 minutes a day of aerobic activity, 2-3 days per week.  Home exercise guidelines will be given to patient during program as part of exercise prescription that the participant will acknowledge.  Activity Barriers & Risk Stratification:  Activity Barriers & Cardiac Risk Stratification - 01/09/23 1429       Activity Barriers & Cardiac Risk Stratification   Activity Barriers Muscular Weakness;Deconditioning;Back Problems    Cardiac Risk Stratification High             6 Minute Walk:  6 Minute Walk     Row Name 01/09/23 1209         6 Minute Walk   Phase Initial     Distance 1650 feet     Walk Time 6 minutes     # of Rest Breaks 0     MPH 3.13     METS 5.81     RPE 12     Perceived Dyspnea  0     VO2 Peak 20.3     Symptoms Yes (comment)     Comments Bilateral calf tightness     Resting HR 88 bpm     Resting BP 130/90     Resting Oxygen Saturation  98 %     Exercise Oxygen Saturation  during 6 min walk 96 %     Max Ex. HR 111 bpm     Max Ex. BP 164/88     2 Minute Post BP 126/80              Oxygen Initial Assessment:   Oxygen Re-Evaluation:   Oxygen Discharge (Final Oxygen Re-Evaluation):   Initial Exercise Prescription:  Initial Exercise Prescription - 01/09/23 1400       Date of Initial Exercise RX and Referring Provider   Date 01/09/23    Referring Provider Dr. Edwena Blow Armanda Magic, MD covering)    Expected Discharge Date 03/23/23       Arm Ergometer   Level 1.5    Watts 25    RPM 60    Minutes 15    METs 5.8  Recumbant Elliptical   Level 2    RPM 60    Watts 90    Minutes 15    METs 5.8      Prescription Details   Frequency (times per week) 3    Duration Progress to 30 minutes of continuous aerobic without signs/symptoms of physical distress      Intensity   THRR 40-80% of Max Heartrate 78-156    Ratings of Perceived Exertion 11-13    Perceived Dyspnea 0-4      Progression   Progression Continue progressive overload as per policy without signs/symptoms or physical distress.      Resistance Training   Training Prescription Yes    Weight 3 lbs    Reps 10-15             Perform Capillary Blood Glucose checks as needed.  Exercise Prescription Changes:   Exercise Prescription Changes     Row Name 01/17/23 1200 02/02/23 1100 02/14/23 1040 02/23/23 1030 03/26/23 1533     Response to Exercise   Blood Pressure (Admit) 124/80 128/88 108/78 114/80 110/70   Blood Pressure (Exercise) 138/88 136/74 148/76 128/74 138/80   Blood Pressure (Exit) 110/80 108/70 120/80 120/78 108/72   Heart Rate (Admit) 92 bpm 102 bpm 101 bpm 92 bpm 84 bpm   Heart Rate (Exercise) 117 bpm 116 bpm 114 bpm 113 bpm 112 bpm   Heart Rate (Exit) 101 bpm 103 bpm 106 bpm 99 bpm 96 bpm   Rating of Perceived Exertion (Exercise) 13 12 14 12 12    Symptoms None None None None None   Comments Pt's first day in the CRP2 program Reviewed METs Reviewed METs and Goals Reviewed Home exercise Rx Reviewed METs and goals   Duration Continue with 30 min of aerobic exercise without signs/symptoms of physical distress. Continue with 30 min of aerobic exercise without signs/symptoms of physical distress. Continue with 30 min of aerobic exercise without signs/symptoms of physical distress. Continue with 30 min of aerobic exercise without signs/symptoms of physical distress. Continue with 30 min of aerobic exercise without signs/symptoms of physical  distress.   Intensity THRR unchanged THRR unchanged THRR unchanged THRR unchanged THRR unchanged     Progression   Progression Continue to progress workloads to maintain intensity without signs/symptoms of physical distress. Continue to progress workloads to maintain intensity without signs/symptoms of physical distress. Continue to progress workloads to maintain intensity without signs/symptoms of physical distress. Continue to progress workloads to maintain intensity without signs/symptoms of physical distress. Continue to progress workloads to maintain intensity without signs/symptoms of physical distress.   Average METs 2.45 3.1 3.3 3.3 4.1     Resistance Training   Training Prescription No Yes No Yes Yes   Weight No weights on Wednesdays 3 lbs No weights on wednesday 3 lbs 3 lbs   Reps -- 10-15 -- 10-15 10-15   Time -- 10 Minutes -- 10 Minutes 10 Minutes     Interval Training   Interval Training No No No No No     Arm Ergometer   Level 1.5 1.5 1.5 1.5 1.8   Watts -- -- 19 18 28    RPM 6 -- 71 68 68   Minutes 15 15 15 15 15    METs 1.9 2.1 2.2 2.4 2.3     Recumbant Elliptical   Level 2 2 2 2  --   RPM 64 -- 77 69 --   Watts 86 121 122 126 --   Minutes 15 15 15  15  15   METs 3 4.1 4.1 4.1 5.9     Home Exercise Plan   Plans to continue exercise at -- -- -- Home (comment) Home (comment)   Frequency -- -- -- Add 3 additional days to program exercise sessions. Add 3 additional days to program exercise sessions.   Initial Home Exercises Provided -- -- -- 02/23/23 02/23/23            Exercise Comments:   Exercise Comments     Row Name 01/17/23 1218 02/02/23 1417 02/14/23 1050 02/23/23 1030 03/26/23 1536   Exercise Comments Pt's first day in the cRP2 program. Pt exercised with no complaints. Reviewed METs with pt today. Pt is tolerating exercise well with an average MET level of 3.1. Pt continues to make great progress within the program and is happy with her progression. Will  continue to monitor and progress workloads as tolerated without s/sx. Reviewed METs and goals. Pt feels postive about her progress and is enjoying the program and education. Reviewed home exercise Rx. Pt is walking at home 3.-5x/week for 30+ minutes. Pt verbalized understanding of the home exercise Rx and was provided a copy. Pt has returned to the CRP2 program after being out sick with virus. Pt is feeling better. Pt did well today with no complaints.            Exercise Goals and Review:   Exercise Goals     Row Name 01/09/23 1430             Exercise Goals   Increase Physical Activity Yes       Intervention Provide advice, education, support and counseling about physical activity/exercise needs.;Develop an individualized exercise prescription for aerobic and resistive training based on initial evaluation findings, risk stratification, comorbidities and participant's personal goals.       Expected Outcomes Short Term: Attend rehab on a regular basis to increase amount of physical activity.;Long Term: Add in home exercise to make exercise part of routine and to increase amount of physical activity.;Long Term: Exercising regularly at least 3-5 days a week.       Increase Strength and Stamina Yes       Intervention Provide advice, education, support and counseling about physical activity/exercise needs.;Develop an individualized exercise prescription for aerobic and resistive training based on initial evaluation findings, risk stratification, comorbidities and participant's personal goals.       Expected Outcomes Short Term: Increase workloads from initial exercise prescription for resistance, speed, and METs.;Short Term: Perform resistance training exercises routinely during rehab and add in resistance training at home;Long Term: Improve cardiorespiratory fitness, muscular endurance and strength as measured by increased METs and functional capacity ( )       Able to understand and use  rate of perceived exertion (RPE) scale Yes       Intervention Provide education and explanation on how to use RPE scale       Expected Outcomes Short Term: Able to use RPE daily in rehab to express subjective intensity level;Long Term:  Able to use RPE to guide intensity level when exercising independently       Knowledge and understanding of Target Heart Rate Range (THRR) Yes       Intervention Provide education and explanation of THRR including how the numbers were predicted and where they are located for reference       Expected Outcomes Short Term: Able to state/look up THRR;Long Term: Able to use THRR to govern intensity when exercising independently;Short Term: Able  to use daily as guideline for intensity in rehab       Understanding of Exercise Prescription Yes       Intervention Provide education, explanation, and written materials on patient's individual exercise prescription       Expected Outcomes Short Term: Able to explain program exercise prescription;Long Term: Able to explain home exercise prescription to exercise independently                Exercise Goals Re-Evaluation :  Exercise Goals Re-Evaluation     Row Name 01/17/23 1217 02/14/23 1050 03/26/23 1534         Exercise Goal Re-Evaluation   Exercise Goals Review Increase Physical Activity;Increase Strength and Stamina;Able to understand and use rate of perceived exertion (RPE) scale;Knowledge and understanding of Target Heart Rate Range (THRR);Understanding of Exercise Prescription Increase Physical Activity;Increase Strength and Stamina;Able to understand and use rate of perceived exertion (RPE) scale;Knowledge and understanding of Target Heart Rate Range (THRR);Understanding of Exercise Prescription Increase Physical Activity;Increase Strength and Stamina;Able to understand and use rate of perceived exertion (RPE) scale;Knowledge and understanding of Target Heart Rate Range (THRR);Understanding of Exercise Prescription      Comments Pt's first day in the CRP2 program. Pt understnads the exercise Rx, RPE scale and THRR. Reviewed METs and goals. Pt is making progress. Pt voices that she has seen imrpove in her strength and stamina as well as her upperbody strength, both patient goals. Pt also has been able to walk 2 miles which is also a patient goal. Reviewed METs and goals. Pt continues tto make good progress. Pt voices that she continues to see improvement in her strength and stamina as well as her upperbody strength, both patient goals. Pt also has been able to walk 2 miles which is also a patient goal. Pt has not resumed swimming yet which is also a goal.     Expected Outcomes Will continue to montior patient and progress exercise workloads as tolerated. Will continue to montior patient and progress exercise workloads as tolerated. Will continue to montior patient and progress exercise workloads as tolerated.              Discharge Exercise Prescription (Final Exercise Prescription Changes):  Exercise Prescription Changes - 03/26/23 1533       Response to Exercise   Blood Pressure (Admit) 110/70    Blood Pressure (Exercise) 138/80    Blood Pressure (Exit) 108/72    Heart Rate (Admit) 84 bpm    Heart Rate (Exercise) 112 bpm    Heart Rate (Exit) 96 bpm    Rating of Perceived Exertion (Exercise) 12    Symptoms None    Comments Reviewed METs and goals    Duration Continue with 30 min of aerobic exercise without signs/symptoms of physical distress.    Intensity THRR unchanged      Progression   Progression Continue to progress workloads to maintain intensity without signs/symptoms of physical distress.    Average METs 4.1      Resistance Training   Training Prescription Yes    Weight 3 lbs    Reps 10-15    Time 10 Minutes      Interval Training   Interval Training No      Arm Ergometer   Level 1.8    Watts 28    RPM 68    Minutes 15    METs 2.3      Recumbant Elliptical   Minutes 15     METs 5.9  Home Exercise Plan   Plans to continue exercise at Home (comment)    Frequency Add 3 additional days to program exercise sessions.    Initial Home Exercises Provided 02/23/23             Nutrition:  Target Goals: Understanding of nutrition guidelines, daily intake of sodium 1500mg , cholesterol 200mg , calories 30% from fat and 7% or less from saturated fats, daily to have 5 or more servings of fruits and vegetables.  Biometrics:  Pre Biometrics - 01/09/23 1042       Pre Biometrics   Waist Circumference 42.5 inches    Hip Circumference 49 inches    Waist to Hip Ratio 0.87 %    Triceps Skinfold 47 mm    % Body Fat 46.4 %    Grip Strength 33 kg    Flexibility 18 in    Single Leg Stand 30 seconds              Nutrition Therapy Plan and Nutrition Goals:  Nutrition Therapy & Goals - 03/19/23 1027       Nutrition Therapy   Diet Heart Healthy Diet    Drug/Food Interactions Statins/Certain Fruits      Personal Nutrition Goals   Nutrition Goal Patient to identify strategies for reducing cardiovascular risk by attending the [employer] education and nutrition series weekly.    Personal Goal #2 Patient to improve diet quality by using the plate method as a guide for meal planning to include lean protein/plant protein, fruits, vegetables, whole grains, nonfat dairy as part of a well-balanced diet.    Personal Goal #3 Patient to identify strategies for weight loss of 0.5-2.0# per week.    Comments Goals in progress.Jahliyah continues to attend the [employer] education and nutrition series regularly. Margeret reports weight gain of ~80# over the last year and she is motivated to lose weight. We have discussed multiple strategies for weight loss including benefits of high protein intake, reducing carbohydrate/fat intake, mindful eating, protein supplements, tracking intake, etc. Answered many questions regarding binge type eating and mindful eating strategies. She has  discussed GLP-1s with her cardiologist and is awaiting approval from insurance company. Per transplant notes, she will begin to wean off prednisone. She has not met weight loss goals but is up 7.7# since starting with our program. 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 Malynda will benefit from participation in intensive cardiac rehab for nutrition, exercise, and lifestyle modification.      Intervention Plan   Intervention Prescribe, educate and counsel regarding individualized specific dietary modifications aiming towards targeted core components such as weight, hypertension, lipid management, diabetes, heart failure and other comorbidities.;Nutrition handout(s) given to patient.    Expected Outcomes Short Term Goal: Understand basic principles of dietary content, such as calories, fat, sodium, cholesterol and nutrients.;Long Term Goal: Adherence to prescribed nutrition plan.             Nutrition Assessments:  Nutrition Assessments - 01/30/23 1145       Rate Your Plate Scores   Pre Score 69            MEDIFICTS Score Key: ?70 Need to make dietary changes  40-70 Heart Healthy Diet ? 40 Therapeutic Level Cholesterol Diet   Flowsheet Row INTENSIVE CARDIAC REHAB from 01/29/2023 in [facility]  Picture Your Plate Total Score on Admission 69      Picture Your Plate Scores: <16 Unhealthy dietary  pattern with much room for improvement. 41-50 Dietary pattern unlikely to meet recommendations for good health and room for improvement. 51-60 More healthful dietary pattern, with some room for improvement.  >60 Healthy dietary pattern, although there may be some specific behaviors that could be improved.    Nutrition Goals Re-Evaluation:  Nutrition Goals Re-Evaluation     Row Name 01/17/23 1001 02/14/23 1108 03/19/23 1027         Goals   Current Weight 242 lb 8.1 oz (110 kg) 244 lb 14.9 oz  (111.1 kg) 248 lb 0.3 oz (112.5 kg)     Comment labs WNL labs WNL A1c 6.7     Expected Outcome Kimble reports weight gain of ~80# over the last year and she is motivated to lose weight. Per transplant notes, she will begin to wean off prednisone. 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 Brilea will benefit from participation in intensive cardiac rehab for nutrition, exercise, and lifestyle modification. Goals in progress.Tracy continues to attend the [employer] education and nutrition series regularly. Shelton reports weight gain of ~80# over the last year and she is motivated to lose weight. We have discussed multiple strategies for weight loss including benefits of high protein intake, reducing carbohydrate/fat intake, mindful eating, protein supplements, tracking intake, etc. She plans to discuss GLP-1s with her cardiologist. Per transplant notes, she will begin to wean off prednisone. 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 Jamiaya will benefit from participation in intensive cardiac rehab for nutrition, exercise, and lifestyle modification. Goals in progress.Juleesa continues to attend the [employer] education and nutrition series regularly. Krisy reports weight gain of ~80# over the last year and she is motivated to lose weight. We have discussed multiple strategies for weight loss including benefits of high protein intake, reducing carbohydrate/fat intake, mindful eating, protein supplements, tracking intake, etc. Answered many questions regarding binge type eating and mindful eating strategies. She has discussed GLP-1s with her cardiologist and is awaiting approval from insurance company. Per transplant notes, she will begin to wean off prednisone. She has not met weight loss goals but is up 7.7# since starting with our program. 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 Renn will benefit from  participation in intensive cardiac rehab for nutrition, exercise, and lifestyle modification.              Nutrition Goals Re-Evaluation:  Nutrition Goals Re-Evaluation     Row Name 01/17/23 1001 02/14/23 1108 03/19/23 1027         Goals   Current Weight 242 lb 8.1 oz (110 kg) 244 lb 14.9 oz (111.1 kg) 248 lb 0.3 oz (112.5 kg)     Comment labs WNL labs WNL A1c 6.7     Expected Outcome Modesty reports weight gain of ~80# over the last year and she is motivated to lose weight. Per transplant notes, she will begin to wean off prednisone. 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 Basha will benefit from participation in intensive cardiac rehab for nutrition, exercise, and lifestyle modification. Goals in progress.Catheryne continues to attend the [employer] education and nutrition series regularly. Asante reports weight gain of ~80# over the last year and she is motivated to lose weight. We have discussed multiple strategies for weight loss including benefits of high protein intake, reducing carbohydrate/fat intake, mindful eating, protein supplements, tracking intake, etc. She plans to discuss GLP-1s with her cardiologist. Per transplant notes, she will begin to wean off  prednisone. 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 Cozy will benefit from participation in intensive cardiac rehab for nutrition, exercise, and lifestyle modification. Goals in progress.Krisie continues to attend the [employer] education and nutrition series regularly. Fanisha reports weight gain of ~80# over the last year and she is motivated to lose weight. We have discussed multiple strategies for weight loss including benefits of high protein intake, reducing carbohydrate/fat intake, mindful eating, protein supplements, tracking intake, etc. Answered many questions regarding binge type eating and mindful eating strategies. She has discussed GLP-1s with  her cardiologist and is awaiting approval from insurance company. Per transplant notes, she will begin to wean off prednisone. She has not met weight loss goals but is up 7.7# since starting with our program. 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 Devondra will benefit from participation in intensive cardiac rehab for nutrition, exercise, and lifestyle modification.              Nutrition Goals Discharge (Final Nutrition Goals Re-Evaluation):  Nutrition Goals Re-Evaluation - 03/19/23 1027       Goals   Current Weight 248 lb 0.3 oz (112.5 kg)    Comment A1c 6.7    Expected Outcome Goals in progress.Javianna continues to attend the [employer] education and nutrition series regularly. Gloriann reports weight gain of ~80# over the last year and she is motivated to lose weight. We have discussed multiple strategies for weight loss including benefits of high protein intake, reducing carbohydrate/fat intake, mindful eating, protein supplements, tracking intake, etc. Answered many questions regarding binge type eating and mindful eating strategies. She has discussed GLP-1s with her cardiologist and is awaiting approval from insurance company. Per transplant notes, she will begin to wean off prednisone. She has not met weight loss goals but is up 7.7# since starting with our program. 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 Ladene will benefit from participation in intensive cardiac rehab for nutrition, exercise, and lifestyle modification.             Psychosocial: Target Goals: Acknowledge presence or absence of significant depression and/or stress, maximize coping skills, provide positive support system. Participant is able to verbalize types and ability to use techniques and skills needed for reducing stress and depression.  Initial Review & Psychosocial Screening:  Initial Psych Review & Screening - 01/09/23 1448       Initial  Review   Current issues with --   Pt is taking wellbutrin and Zoloft. The pt has hx of generalized anxiety. No voiced issues with depression or stress. PHQ -9 =1.     Family Dynamics   Good Support System? Yes    Comments Has mother for support      Barriers   Psychosocial barriers to participate in program There are no identifiable barriers or psychosocial needs.      Screening Interventions   Interventions Encouraged to exercise             Quality of Life Scores:  Quality of Life - 01/09/23 1437       Quality of Life   Select Quality of Life      Quality of Life Scores   Health/Function Pre 24.75 %    Socioeconomic Pre 21.36 %    Socioeconomic Post --    Socioeconomic % Change  --    Psych/Spiritual Pre 24 %    Family Pre 27.88 %    GLOBAL Pre 24.23 %  Scores of 19 and below usually indicate a poorer quality of life in these areas.  A difference of  2-3 points is a clinically meaningful difference.  A difference of 2-3 points in the total score of the Quality of Life Index has been associated with significant improvement in overall quality of life, self-image, physical symptoms, and general health in studies assessing change in quality of life.  PHQ-9: Review Flowsheet       01/09/2023 06/22/2021 03/30/2021  Depression screen PHQ 2/9  Decreased Interest 0 0 1  Down, Depressed, Hopeless 0 1 1  PHQ - 2 Score 0 1 2  Altered sleeping 0 0 0  Tired, decreased energy 0 0 1  Change in appetite 1 0 1  Feeling bad or failure about yourself  0 0 0  Trouble concentrating 0 0 0  Moving slowly or fidgety/restless 0 0 0  Suicidal thoughts 0 0 0  PHQ-9 Score 1 1 4   Difficult doing work/chores Not difficult at all Not difficult at all Somewhat difficult   Interpretation of Total Score  Total Score Depression Severity:  1-4 = Minimal depression, 5-9 = Mild depression, 10-14 = Moderate depression, 15-19 = Moderately severe depression, 20-27 = Severe depression    Psychosocial Evaluation and Intervention:   Psychosocial Re-Evaluation:  Psychosocial Re-Evaluation     Row Name 01/17/23 1418 01/24/23 0920 02/15/23 0934 03/14/23 0958 04/10/23 1230     Psychosocial Re-Evaluation   Current issues with None Identified None Identified None Identified None Identified None Identified   Comments -- -- No psychosocial needs identified No psychosocial needs identified No psychosocial needs identified   Interventions Encouraged to attend Cardiac Rehabilitation for the exercise Encouraged to attend Cardiac Rehabilitation for the exercise Encouraged to attend Cardiac Rehabilitation for the exercise Encouraged to attend Cardiac Rehabilitation for the exercise Encouraged to attend Cardiac Rehabilitation for the exercise   Continue Psychosocial Services  No Follow up required No Follow up required No Follow up required No Follow up required No Follow up required            Psychosocial Discharge (Final Psychosocial Re-Evaluation):  Psychosocial Re-Evaluation - 04/10/23 1230       Psychosocial Re-Evaluation   Current issues with None Identified    Comments No psychosocial needs identified    Interventions Encouraged to attend Cardiac Rehabilitation for the exercise    Continue Psychosocial Services  No Follow up required             Vocational Rehabilitation: Provide vocational rehab assistance to qualifying candidates.   Vocational Rehab Evaluation & Intervention:  Vocational Rehab - 01/09/23 1440       Initial Vocational Rehab Evaluation & Intervention   Assessment shows need for Vocational Rehabilitation No      Vocational Rehab Re-Evaulation   Comments Pt is a registered dietican. Pt was not at her job long enough to qualify for FMLA benefits. 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             Education: Education Goals: Education classes will be provided on a weekly basis, covering required topics. Participant will state  understanding/return demonstration of topics presented.    Education     Row Name 01/17/23 1000     Education   Cardiac Education Topics Pritikin   Customer service manager   Weekly Topic Fast and Healthy Breakfasts   Instruction Review Code 1- [employer] Understanding  Class Start Time 0813   Class Stop Time 0856   Class Time Calculation (min) 43 min    Row Name 01/19/23 0900     Education   Cardiac Education Topics Pritikin   Select Core Videos     Core Videos   Educator Dietitian   Select Nutrition   Instruction Review Code 1- Verbalizes Understanding   Class Start Time 0815   Class Stop Time 0905   Class Time Calculation (min) 50 min    Row Name 01/22/23 0900     Education   Cardiac Education Topics Pritikin   Select Workshops     Workshops   Educator Exercise Physiologist   Select Psychosocial   Psychosocial Workshop Recognizing and Reducing Stress   Instruction Review Code 1- Verbalizes Understanding   Class Start Time [phone]   Class Stop Time 0853   Class Time Calculation (min) 41 min    Row Name 01/24/23 1000     Education   Cardiac Education Topics Pritikin   Secondary school teacher School   Educator Dietitian   Weekly Topic Personalizing Your Pritikin Plate   Instruction Review Code 1- Verbalizes Understanding   Class Start Time [phone]   Class Stop Time 0848   Class Time Calculation (min) 38 min    Row Name 01/26/23 1100     Education   Cardiac Education Topics Pritikin   Nurse, children's   Educator Exercise Physiologist   Select Psychosocial   Psychosocial Healthy Minds, Bodies, Hearts   Instruction Review Code 1- Verbalizes Understanding   Class Start Time [phone]   Class Stop Time 0853   Class Time Calculation (min) 36 min    Row Name 01/29/23 1000     Education   Cardiac Education Topics Pritikin   Glass blower/designer  Nutrition   Nutrition Workshop Label Reading   Instruction Review Code 1- Verbalizes Understanding   Class Start Time 0815   Class Stop Time 0905   Class Time Calculation (min) 50 min    Row Name 01/31/23 1100     Education   Cardiac Education Topics Pritikin   Orthoptist   Educator Dietitian   Weekly Topic [employer] Desserts   Instruction Review Code 1- Verbalizes Understanding   Class Start Time 0815   Class Stop Time 0856   Class Time Calculation (min) 41 min    Row Name 02/02/23 0900     Education   Cardiac Education Topics Pritikin   Select Core Videos     Core Videos   Educator Dietitian   Select Nutrition   Nutrition Other   Instruction Review Code 1- Verbalizes Understanding   Class Start Time 0815   Class Stop Time 0900   Class Time Calculation (min) 45 min    Row Name 02/05/23 0900     Education   Cardiac Education Topics Pritikin   Select Workshops     Workshops   Educator Exercise Physiologist   Select Exercise   Exercise Workshop Exercise Basics: Building Your Action Plan   Instruction Review Code 1- Verbalizes Understanding   Class Start Time 0815   Class Stop Time 0903   Class Time Calculation (min) 48 min    Row Name 02/14/23 0900     Education   Cardiac Education Topics Pritikin   [employer]  Sports administrator - Meals in a Snap   Instruction Review Code 1- Verbalizes Understanding   Class Start Time 0815   Class Stop Time 0849   Class Time Calculation (min) 34 min    Row Name 02/19/23 1600     Education   Cardiac Education Topics Pritikin   Select Workshops     Workshops   Educator Exercise Physiologist   Select Psychosocial   Psychosocial Workshop Focused Goals, Sustainable Changes   Instruction Review Code 1- Verbalizes Understanding   Class Start Time 0810   Class Stop Time 0855   Class Time Calculation (min) 45 min    Row  Name 02/23/23 0900     Education   Cardiac Education Topics --   Select --     Core Videos   Educator --   Select --   General Education --   Instruction Review Code --   Class Start Time --   Class Stop Time --   Class Time Calculation (min) --    Row Name 02/26/23 0800     Education   Cardiac Education Topics Pritikin   Select Core Videos     Core Videos   Educator Exercise Physiologist   Select Exercise Education   Exercise Education Biomechanial Limitations   Instruction Review Code 1- Verbalizes Understanding   Class Start Time [phone]   Class Stop Time 0855   Class Time Calculation (min) 41 min    Row Name 02/28/23 0900     Education   Cardiac Education Topics Pritikin   Orthoptist   Educator Dietitian   Weekly Topic Comforting Weekend Breakfasts   Instruction Review Code 1- Verbalizes Understanding   Class Start Time 0815   Class Stop Time 0846   Class Time Calculation (min) 31 min    Row Name 03/02/23 0900     Education   Cardiac Education Topics Pritikin   Select Core Videos     Core Videos   Educator Dietitian   Select Nutrition   Nutrition Dining Out - Part 1   Instruction Review Code 1- Verbalizes Understanding   Class Start Time 0815   Class Stop Time 0849   Class Time Calculation (min) 34 min    Row Name 03/05/23 1000     Education   Cardiac Education Topics Pritikin   Select Core Videos     Core Videos   Educator Dietitian   Select Nutrition   Nutrition Facts on Fat   Instruction Review Code 1- Verbalizes Understanding   Class Start Time 0815   Class Stop Time 0852   Class Time Calculation (min) 37 min    Row Name 03/07/23 1000     Education   Cardiac Education Topics Pritikin   Orthoptist   Educator Dietitian   Weekly Topic Fast Evening Meals   Instruction Review Code 1- Verbalizes Understanding   Class Start Time 0815   Class Stop Time [phone]   Class Time  Calculation (min) 37 min    Row Name 03/26/23 1100     Education   Cardiac Education Topics Pritikin   Geographical information systems officer Psychosocial   Psychosocial Workshop From Head to Heart: The Power of a Healthy Outlook   Instruction Review Code 1- Education officer, environmental  Time 0815   Class Stop Time 0903   Class Time Calculation (min) 48 min    Row Name 03/28/23 0900     Education   Cardiac Education Topics Pritikin   Secondary school teacher School   Educator Dietitian   Weekly Topic Powerhouse Plant-Based Proteins   Instruction Review Code 1- Verbalizes Understanding   Class Start Time [phone]   Class Stop Time 0845   Class Time Calculation (min) 35 min    Row Name 04/04/23 1100     Education   Cardiac Education Topics Pritikin   Secondary school teacher School   Educator Dietitian   Weekly Topic Adding Flavor - Sodium-Free   Instruction Review Code 1- Verbalizes Understanding   Class Start Time [phone]   Class Stop Time 0845   Class Time Calculation (min) 33 min    Row Name 04/06/23 1400     Education   Cardiac Education Topics Pritikin   Hospital doctor Education   General Education Heart Disease Risk Reduction   Instruction Review Code 1- Verbalizes Understanding   Class Start Time 0810   Class Stop Time 0849   Class Time Calculation (min) 39 min            Core Videos: Exercise    Move It!  Clinical staff conducted group or individual video education with verbal and written material and guidebook.  Patient learns the recommended Pritikin exercise program. Exercise with the goal of living a long, healthy life. Some of the health benefits of exercise include controlled diabetes, healthier blood pressure levels, improved cholesterol levels, improved heart and lung capacity, improved sleep, and better body  composition. Everyone should speak with their doctor before starting or changing an exercise routine.  Biomechanical Limitations Clinical staff conducted group or individual video education with verbal and written material and guidebook.  Patient learns how biomechanical limitations can impact exercise and how we can mitigate and possibly overcome limitations to have an impactful and balanced exercise routine.  Body Composition Clinical staff conducted group or individual video education with verbal and written material and guidebook.  Patient learns that body composition (ratio of muscle mass to fat mass) is a key component to assessing overall fitness, rather than body weight alone. Increased fat mass, especially visceral belly fat, can put [place] at increased risk for metabolic syndrome, type 2 diabetes, heart disease, and even death. It is recommended to combine diet and exercise (cardiovascular and resistance training) to improve your body composition. Seek guidance from your physician and exercise physiologist before implementing an exercise routine.  Exercise Action Plan Clinical staff conducted group or individual video education with verbal and written material and guidebook.  Patient learns the recommended strategies to achieve and enjoy long-term exercise adherence, including variety, self-motivation, self-efficacy, and positive decision making. Benefits of exercise include fitness, good health, weight management, more energy, better sleep, less stress, and overall well-being.  Medical   Heart Disease Risk Reduction Clinical staff conducted group or individual video education with verbal and written material and guidebook.  Patient learns our heart is our most vital organ as it circulates oxygen, nutrients, white blood cells, and hormones throughout the entire body, and carries waste away. Data supports a plant-based eating plan like the Pritikin Program for its effectiveness in slowing  progression of and reversing heart disease. The video provides a  number of recommendations to address heart disease.   Metabolic Syndrome and Belly Fat  Clinical staff conducted group or individual video education with verbal and written material and guidebook.  Patient learns what metabolic syndrome is, how it leads to heart disease, and how one can reverse it and keep it from coming back. You have metabolic syndrome if you have 3 of the following 5 criteria: abdominal obesity, high blood pressure, high triglycerides, low HDL cholesterol, and high blood sugar.  Hypertension and Heart Disease Clinical staff conducted group or individual video education with verbal and written material and guidebook.  Patient learns that high blood pressure, or hypertension, is very common in the [place]. Hypertension is largely due to excessive salt intake, but other important risk factors include being overweight, physical inactivity, drinking too much alcohol, smoking, and not eating enough potassium from fruits and vegetables. High blood pressure is a leading risk factor for heart attack, stroke, congestive heart failure, dementia, kidney failure, and premature death. Long-term effects of excessive salt intake include stiffening of the arteries and thickening of heart muscle and organ damage. Recommendations include ways to reduce hypertension and the risk of heart disease.  Diseases of Our Time - Focusing on Diabetes Clinical staff conducted group or individual video education with verbal and written material and guidebook.  Patient learns why the best way to stop diseases of our time is prevention, through food and other lifestyle changes. Medicine (such as prescription pills and surgeries) is often only a Band-Aid on the problem, not a long-term solution. Most common diseases of our time include obesity, type 2 diabetes, hypertension, heart disease, and cancer. The Pritikin Program is recommended and has been  proven to help reduce, reverse, and/or prevent the damaging effects of metabolic syndrome.  Nutrition   Overview of the Pritikin Eating Plan  Clinical staff conducted group or individual video education with verbal and written material and guidebook.  Patient learns about the Pritikin Eating Plan for disease risk reduction. The Pritikin Eating Plan emphasizes a wide variety of unrefined, minimally-processed carbohydrates, like fruits, vegetables, whole grains, and legumes. Go, Caution, and Stop food choices are explained. Plant-based and lean animal proteins are emphasized. Rationale provided for low sodium intake for blood pressure control, low added sugars for blood sugar stabilization, and low added fats and oils for coronary artery disease risk reduction and weight management.  Calorie Density  Clinical staff conducted group or individual video education with verbal and written material and guidebook.  Patient learns about calorie density and how it impacts the Pritikin Eating Plan. Knowing the characteristics of the food you choose will help you decide whether those foods will lead to weight gain or weight loss, and whether you want to consume more or less of them. Weight loss is usually a side effect of the Pritikin Eating Plan because of its focus on low calorie-dense foods.  Label Reading  Clinical staff conducted group or individual video education with verbal and written material and guidebook.  Patient learns about the Pritikin recommended label reading guidelines and corresponding recommendations regarding calorie density, added sugars, sodium content, and whole grains.  Dining Out - Part 1  Clinical staff conducted group or individual video education with verbal and written material and guidebook.  Patient learns that restaurant meals can be sabotaging because they can be so high in calories, fat, sodium, and/or sugar. Patient learns recommended strategies on how to positively address  this and avoid unhealthy pitfalls.  Facts on Fats  Clinical staff conducted group or individual video education with verbal and written material and guidebook.  Patient learns that lifestyle modifications can be just as effective, if not more so, as many medications for lowering your risk of heart disease. A Pritikin lifestyle can help to reduce your risk of inflammation and atherosclerosis (cholesterol build-up, or plaque, in the artery walls). Lifestyle interventions such as dietary choices and physical activity address the cause of atherosclerosis. A review of the types of fats and their impact on blood cholesterol levels, along with dietary recommendations to reduce fat intake is also included.  Nutrition Action Plan  Clinical staff conducted group or individual video education with verbal and written material and guidebook.  Patient learns how to incorporate Pritikin recommendations into their lifestyle. Recommendations include planning and keeping personal health goals in mind as an important part of their success.  Healthy Mind-Set    Healthy Minds, Bodies, Hearts  Clinical staff conducted group or individual video education with verbal and written material and guidebook.  Patient learns how to identify when they are stressed. Video will discuss the impact of that stress, as well as the many benefits of stress management. Patient will also be introduced to stress management techniques. The way we think, act, and feel has an impact on our hearts.  How Our Thoughts Can Heal Our Hearts  Clinical staff conducted group or individual video education with verbal and written material and guidebook.  Patient learns that negative thoughts can cause depression and anxiety. This can result in negative lifestyle behavior and serious health problems. Cognitive behavioral therapy is an effective method to help control our thoughts in order to change and improve our emotional outlook.  Additional  Videos:  Exercise    Improving Performance  Clinical staff conducted group or individual video education with verbal and written material and guidebook.  Patient learns to use a non-linear approach by alternating intensity levels and lengths of time spent exercising to help burn more calories and lose more body fat. Cardiovascular exercise helps improve heart health, metabolism, hormonal balance, blood sugar control, and recovery from fatigue. Resistance training improves strength, endurance, balance, coordination, reaction time, metabolism, and muscle mass. Flexibility exercise improves circulation, posture, and balance. Seek guidance from your physician and exercise physiologist before implementing an exercise routine and learn your capabilities and proper form for all exercise.  Introduction to Yoga  Clinical staff conducted group or individual video education with verbal and written material and guidebook.  Patient learns about yoga, a discipline of the coming together of mind, breath, and body. The benefits of yoga include improved flexibility, improved range of motion, better posture and core strength, increased lung function, weight loss, and positive self-image. Yoga's heart health benefits include lowered blood pressure, healthier heart rate, decreased cholesterol and triglyceride levels, improved immune function, and reduced stress. Seek guidance from your physician and exercise physiologist before implementing an exercise routine and learn your capabilities and proper form for all exercise.  Medical   Aging: Enhancing Your Quality of Life  Clinical staff conducted group or individual video education with verbal and written material and guidebook.  Patient learns key strategies and recommendations to stay in good physical health and enhance quality of life, such as prevention strategies, having an advocate, securing a Health Care Proxy and Power of Attorney, and keeping a list of medications  and system for tracking them. It also discusses how to avoid risk for bone loss.  Biology of Weight Control  Clinical staff conducted  group or individual video education with verbal and written material and guidebook.  Patient learns that weight gain occurs because we consume more calories than we burn (eating more, moving less). Even if your body weight is normal, you may have higher ratios of fat compared to muscle mass. Too much body fat puts you at increased risk for cardiovascular disease, heart attack, stroke, type 2 diabetes, and obesity-related cancers. In addition to exercise, following the Pritikin Eating Plan can help reduce your risk.  Decoding Lab Results  Clinical staff conducted group or individual video education with verbal and written material and guidebook.  Patient learns that lab test reflects one measurement whose values change over time and are influenced by many factors, including medication, stress, sleep, exercise, food, hydration, pre-existing medical conditions, and more. It is recommended to use the knowledge from this video to become more involved with your lab results and evaluate your numbers to speak with your doctor.   Diseases of Our Time - Overview  Clinical staff conducted group or individual video education with verbal and written material and guidebook.  Patient learns that according to the CDC, 50% to 70% of chronic diseases (such as obesity, type 2 diabetes, elevated lipids, hypertension, and heart disease) are avoidable through lifestyle improvements including healthier food choices, listening to satiety cues, and increased physical activity.  Sleep Disorders Clinical staff conducted group or individual video education with verbal and written material and guidebook.  Patient learns how good quality and duration of sleep are important to overall health and well-being. Patient also learns about sleep disorders and how they impact health along with  recommendations to address them, including discussing with a physician.  Nutrition  Dining Out - Part 2 Clinical staff conducted group or individual video education with verbal and written material and guidebook.  Patient learns how to plan ahead and communicate in order to maximize their dining experience in a healthy and nutritious manner. Included are recommended food choices based on the type of restaurant the patient is visiting.   Fueling a Banker conducted group or individual video education with verbal and written material and guidebook.  There is a strong connection between our food choices and our health. Diseases like obesity and type 2 diabetes are very prevalent and are in large-part due to lifestyle choices. The Pritikin Eating Plan provides plenty of food and hunger-curbing satisfaction. It is easy to follow, affordable, and helps reduce health risks.  Menu Workshop  Clinical staff conducted group or individual video education with verbal and written material and guidebook.  Patient learns that restaurant meals can sabotage health goals because they are often packed with calories, fat, sodium, and sugar. Recommendations include strategies to plan ahead and to communicate with the manager, chef, or server to help order a healthier meal.  Planning Your Eating Strategy  Clinical staff conducted group or individual video education with verbal and written material and guidebook.  Patient learns about the Pritikin Eating Plan and its benefit of reducing the risk of disease. The Pritikin Eating Plan does not focus on calories. Instead, it emphasizes high-quality, nutrient-rich foods. By knowing the characteristics of the foods, we choose, we can determine their calorie density and make informed decisions.  Targeting Your Nutrition Priorities  Clinical staff conducted group or individual video education with verbal and written material and guidebook.  Patient learns  that lifestyle habits have a tremendous impact on disease risk and progression. This video provides eating and physical activity  recommendations based on your personal health goals, such as reducing LDL cholesterol, losing weight, preventing or controlling type 2 diabetes, and reducing high blood pressure.  Vitamins and Minerals  Clinical staff conducted group or individual video education with verbal and written material and guidebook.  Patient learns different ways to obtain key vitamins and minerals, including through a recommended healthy diet. It is important to discuss all supplements you take with your doctor.   Healthy Mind-Set    Smoking Cessation  Clinical staff conducted group or individual video education with verbal and written material and guidebook.  Patient learns that cigarette smoking and tobacco addiction pose a serious health risk which affects millions of people. Stopping smoking will significantly reduce the risk of heart disease, lung disease, and many forms of cancer. Recommended strategies for quitting are covered, including working with your doctor to develop a successful plan.  Culinary   Becoming a Set designer conducted group or individual video education with verbal and written material and guidebook.  Patient learns that cooking at home can be healthy, cost-effective, quick, and puts them in control. Keys to cooking healthy recipes will include looking at your recipe, assessing your equipment needs, planning ahead, making it simple, choosing cost-effective seasonal ingredients, and limiting the use of added fats, salts, and sugars.  Cooking - Breakfast and Snacks  Clinical staff conducted group or individual video education with verbal and written material and guidebook.  Patient learns how important breakfast is to satiety and nutrition through the entire day. Recommendations include key foods to eat during breakfast to help stabilize blood sugar  levels and to prevent overeating at meals later in the day. Planning ahead is also a key component.  Cooking - Educational psychologist conducted group or individual video education with verbal and written material and guidebook.  Patient learns eating strategies to improve overall health, including an approach to cook more at home. Recommendations include thinking of animal protein as a side on your plate rather than center stage and focusing instead on lower calorie dense options like vegetables, fruits, whole grains, and plant-based proteins, such as beans. Making sauces in large quantities to freeze for later and leaving the skin on your vegetables are also recommended to maximize your experience.  Cooking - Healthy Salads and Dressing Clinical staff conducted group or individual video education with verbal and written material and guidebook.  Patient learns that vegetables, fruits, whole grains, and legumes are the foundations of the Pritikin Eating Plan. Recommendations include how to incorporate each of these in flavorful and healthy salads, and how to create homemade salad dressings. Proper handling of ingredients is also covered. Cooking - Soups and [employer] - Soups and Desserts Clinical staff conducted group or individual video education with verbal and written material and guidebook.  Patient learns that Pritikin soups and desserts make for easy, nutritious, and delicious snacks and meal components that are low in sodium, fat, sugar, and calorie density, while high in vitamins, minerals, and filling fiber. Recommendations include simple and healthy ideas for soups and desserts.   Overview     The Pritikin Solution Program Overview Clinical staff conducted group or individual video education with verbal and written material and guidebook.  Patient learns that the results of the Pritikin Program have been documented in more than 100 articles published in peer-reviewed  journals, and the benefits include reducing risk factors for (and, in some cases, even reversing) high cholesterol, high blood pressure,  type 2 diabetes, obesity, and more! An overview of the three key pillars of the Pritikin Program will be covered: eating well, doing regular exercise, and having a healthy mind-set.  WORKSHOPS  Exercise: Exercise Basics: Building Your Action Plan Clinical staff led group instruction and group discussion with PowerPoint presentation and patient guidebook. To enhance the learning environment the use of posters, models and videos may be added. At the conclusion of this workshop, patients will comprehend the difference between physical activity and exercise, as well as the benefits of incorporating both, into their routine. Patients will understand the FITT (Frequency, Intensity, Time, and Type) principle and how to use it to build an exercise action plan. In addition, safety concerns and other considerations for exercise and cardiac rehab will be addressed by the presenter. The purpose of this lesson is to promote a comprehensive and effective weekly exercise routine in order to improve patients' overall level of fitness.   Managing Heart Disease: Your Path to a Healthier Heart Clinical staff led group instruction and group discussion with PowerPoint presentation and patient guidebook. To enhance the learning environment the use of posters, models and videos may be added.At the conclusion of this workshop, patients will understand the anatomy and physiology of the heart. Additionally, they will understand how Pritikin's three pillars impact the risk factors, the progression, and the management of heart disease.  The purpose of this lesson is to provide a high-level overview of the heart, heart disease, and how the Pritikin lifestyle positively impacts risk factors.  Exercise Biomechanics Clinical staff led group instruction and group discussion with PowerPoint  presentation and patient guidebook. To enhance the learning environment the use of posters, models and videos may be added. Patients will learn how the structural parts of their bodies function and how these functions impact their daily activities, movement, and exercise. Patients will learn how to promote a neutral spine, learn how to manage pain, and identify ways to improve their physical movement in order to promote healthy living. The purpose of this lesson is to expose patients to common physical limitations that impact physical activity. Participants will learn practical ways to adapt and manage aches and pains, and to minimize their effect on regular exercise. Patients will learn how to maintain good posture while sitting, walking, and lifting.  Balance Training and Fall Prevention  Clinical staff led group instruction and group discussion with PowerPoint presentation and patient guidebook. To enhance the learning environment the use of posters, models and videos may be added. At the conclusion of this workshop, patients will understand the importance of their sensorimotor skills (vision, proprioception, and the vestibular system) in maintaining their ability to balance as they age. Patients will apply a variety of balancing exercises that are appropriate for their current level of function. Patients will understand the common causes for poor balance, possible solutions to these problems, and ways to modify their physical environment in order to minimize their fall risk. The purpose of this lesson is to teach patients about the importance of maintaining balance as they age and ways to minimize their risk of falling.  WORKSHOPS   Nutrition:  Fueling a Ship broker led group instruction and group discussion with PowerPoint presentation and patient guidebook. To enhance the learning environment the use of posters, models and videos may be added. Patients will review the  foundational principles of the Pritikin Eating Plan and understand what constitutes a serving size in each of the food groups. Patients will also learn Pritikin-friendly  foods that are better choices when away from home and review make-ahead meal and snack options. Calorie density will be reviewed and applied to three nutrition priorities: weight maintenance, weight loss, and weight gain. The purpose of this lesson is to reinforce (in a group setting) the key concepts around what patients are recommended to eat and how to apply these guidelines when away from home by planning and selecting Pritikin-friendly options. Patients will understand how calorie density may be adjusted for different weight management goals.  Mindful Eating  Clinical staff led group instruction and group discussion with PowerPoint presentation and patient guidebook. To enhance the learning environment the use of posters, models and videos may be added. Patients will briefly review the concepts of the Pritikin Eating Plan and the importance of low-calorie dense foods. The concept of mindful eating will be introduced as well as the importance of paying attention to internal hunger signals. Triggers for non-hunger eating and techniques for dealing with triggers will be explored. The purpose of this lesson is to provide patients with the opportunity to review the basic principles of the Pritikin Eating Plan, discuss the value of eating mindfully and how to measure internal cues of hunger and fullness using the Hunger Scale. Patients will also discuss reasons for non-hunger eating and learn strategies to use for controlling emotional eating.  Targeting Your Nutrition Priorities Clinical staff led group instruction and group discussion with PowerPoint presentation and patient guidebook. To enhance the learning environment the use of posters, models and videos may be added. Patients will learn how to determine their genetic susceptibility to  disease by reviewing their family history. Patients will gain insight into the importance of diet as part of an overall healthy lifestyle in mitigating the impact of genetics and other environmental insults. The purpose of this lesson is to provide patients with the opportunity to assess their personal nutrition priorities by looking at their family history, their own health history and current risk factors. Patients will also be able to discuss ways of prioritizing and modifying the Pritikin Eating Plan for their highest risk areas  Menu  Clinical staff led group instruction and group discussion with PowerPoint presentation and patient guidebook. To enhance the learning environment the use of posters, models and videos may be added. Using menus brought in from [employer], or printed from [employer], patients will apply the Pritikin dining out guidelines that were presented in the [employer] video. Patients will also be able to practice these guidelines in a variety of provided scenarios. The purpose of this lesson is to provide patients with the opportunity to practice hands-on learning of the Pritikin Dining Out guidelines with actual menus and practice scenarios.  Label Reading Clinical staff led group instruction and group discussion with PowerPoint presentation and patient guidebook. To enhance the learning environment the use of posters, models and videos may be added. Patients will review and discuss the Pritikin label reading guidelines presented in Pritikin's Label Reading Educational series video. Using fool labels brought in from local grocery stores and markets, patients will apply the label reading guidelines and determine if the packaged food meet the Pritikin guidelines. The purpose of this lesson is to provide patients with the opportunity to review, discuss, and practice hands-on learning of the Pritikin Label Reading guidelines with actual packaged food  labels. Cooking School  Pritikin's [employer] are designed to teach patients ways to prepare quick, simple, and affordable recipes at home. The importance of nutrition's role  in chronic disease risk reduction is reflected in its emphasis in the overall Pritikin program. By learning how to prepare essential core Pritikin Eating Plan recipes, patients will increase control over what they eat; be able to customize the flavor of foods without the use of added salt, sugar, or fat; and improve the quality of the food they consume. By learning a set of core recipes which are easily assembled, quickly prepared, and affordable, patients are more likely to prepare more healthy foods at home. These workshops focus on convenient breakfasts, simple entres, side dishes, and desserts which can be prepared with minimal effort and are consistent with nutrition recommendations for cardiovascular risk reduction. Cooking [employer] are taught by a Armed forces logistics/support/administrative officer (RD) who has been trained by the [employer]. The chef or RD has a clear understanding of the importance of minimizing - if not completely eliminating - added fat, sugar, and sodium in recipes. Throughout the series of Cooking School Workshop sessions, patients will learn about healthy ingredients and efficient methods of cooking to build confidence in their capability to prepare    Cooking School weekly topics:  Adding Flavor- Sodium-Free  Fast and Healthy Breakfasts  Powerhouse Plant-Based Proteins  Satisfying Salads and Dressings  Simple Sides and Sauces  International Cuisine-Spotlight on the [employer] Zones  Delicious Desserts  Savory Soups  [employer] - Meals in a Astronomer Appetizers and Snacks  Comforting Weekend Breakfasts  One-Pot Wonders   Fast Evening Meals  Landscape architect Your Pritikin Plate  WORKSHOPS   Healthy Mindset (Psychosocial):  Focused Goals, Sustainable  Changes Clinical staff led group instruction and group discussion with PowerPoint presentation and patient guidebook. To enhance the learning environment the use of posters, models and videos may be added. Patients will be able to apply effective goal setting strategies to establish at least one personal goal, and then take consistent, meaningful action toward that goal. They will learn to identify common barriers to achieving personal goals and develop strategies to overcome them. Patients will also gain an understanding of how our mind-set can impact our ability to achieve goals and the importance of cultivating a positive and growth-oriented mind-set. The purpose of this lesson is to provide patients with a deeper understanding of how to set and achieve personal goals, as well as the tools and strategies needed to overcome common obstacles which may arise along the way.  From Head to Heart: The Power of a Healthy Outlook  Clinical staff led group instruction and group discussion with PowerPoint presentation and patient guidebook. To enhance the learning environment the use of posters, models and videos may be added. Patients will be able to recognize and describe the impact of emotions and mood on physical health. They will discover the importance of self-care and explore self-care practices which may work for them. Patients will also learn how to utilize the 4 C's to cultivate a healthier outlook and better manage stress and challenges. The purpose of this lesson is to demonstrate to patients how a healthy outlook is an essential part of maintaining good health, especially as they continue their cardiac rehab journey.  Healthy Sleep for a Healthy Heart Clinical staff led group instruction and group discussion with PowerPoint presentation and patient guidebook. To enhance the learning environment the use of posters, models and videos may be added. At the conclusion of this workshop, patients will be able  to demonstrate knowledge of the importance of sleep to overall health,  well-being, and quality of life. They will understand the symptoms of, and treatments for, common sleep disorders. Patients will also be able to identify daytime and nighttime behaviors which impact sleep, and they will be able to apply these tools to help manage sleep-related challenges. The purpose of this lesson is to provide patients with a general overview of sleep and outline the importance of quality sleep. Patients will learn about a few of the most common sleep disorders. Patients will also be introduced to the concept of "sleep hygiene," and discover ways to self-manage certain sleeping problems through simple daily behavior changes. Finally, the workshop will motivate patients by clarifying the links between quality sleep and their goals of heart-healthy living.   Recognizing and Reducing Stress Clinical staff led group instruction and group discussion with PowerPoint presentation and patient guidebook. To enhance the learning environment the use of posters, models and videos may be added. At the conclusion of this workshop, patients will be able to understand the types of stress reactions, differentiate between acute and chronic stress, and recognize the impact that chronic stress has on their health. They will also be able to apply different coping mechanisms, such as reframing negative self-talk. Patients will have the opportunity to practice a variety of stress management techniques, such as deep abdominal breathing, progressive muscle relaxation, and/or guided imagery.  The purpose of this lesson is to educate patients on the role of stress in their lives and to provide healthy techniques for coping with it.  Learning Barriers/Preferences:  Learning Barriers/Preferences - 01/09/23 1439       Learning Barriers/Preferences   Learning Barriers None    Learning Preferences Written Material;Computer/Internet;Skilled  Demonstration;Individual Instruction;Group Instruction             Education Topics:  Knowledge Questionnaire Score:  Knowledge Questionnaire Score - 01/09/23 1439       Knowledge Questionnaire Score   Pre Score 22/24             Core Components/Risk Factors/Patient Goals at Admission:  Personal Goals and Risk Factors at Admission - 01/09/23 1525       Core Components/Risk Factors/Patient Goals on Admission    Weight Management Yes;Weight Loss    Intervention Weight Management: Develop a combined nutrition and exercise program designed to reach desired caloric intake, while maintaining appropriate intake of nutrient and fiber, sodium and fats, and appropriate energy expenditure required for the weight goal.;Weight Management: Provide education and appropriate resources to help participant work on and attain dietary goals.;Weight Management/Obesity: Establish reasonable short term and long term weight goals.;Obesity: Provide education and appropriate resources to help participant work on and attain dietary goals.    Admit Weight 240 lb 4.8 oz (109 kg)    Expected Outcomes Short Term: Continue to assess and modify interventions until short term weight is achieved;Long Term: Adherence to nutrition and physical activity/exercise program aimed toward attainment of established weight goal;Weight Loss: Understanding of general recommendations for a balanced deficit meal plan, which promotes 1-2 lb weight loss per week and includes a negative energy balance of [phone] kcal/d;Understanding recommendations for meals to include 15-35% energy as protein, 25-35% energy from fat, 35-60% energy from carbohydrates, less than 200mg  of dietary cholesterol, 20-35 gm of total fiber daily;Understanding of distribution of calorie intake throughout the day with the consumption of 4-5 meals/snacks    Hypertension Yes    Intervention Provide education on lifestyle modifcations including regular physical  activity/exercise, weight management, moderate sodium restriction and increased consumption of fresh  fruit, vegetables, and low fat dairy, alcohol moderation, and smoking cessation.;Monitor prescription use compliance.    Expected Outcomes Short Term: Continued assessment and intervention until BP is < 140/54mm HG in hypertensive participants. < 130/44mm HG in hypertensive participants with diabetes, heart failure or chronic kidney disease.;Long Term: Maintenance of blood pressure at goal levels.             Core Components/Risk Factors/Patient Goals Review:   Goals and Risk Factor Review     Row Name 01/17/23 1418 01/24/23 0925 02/15/23 0935 03/14/23 1000 04/10/23 1231     Core Components/Risk Factors/Patient Goals Review   Personal Goals Review Weight Management/Obesity;Hypertension Weight Management/Obesity;Hypertension Weight Management/Obesity;Hypertension Weight Management/Obesity;Hypertension Weight Management/Obesity;Hypertension   Review Bethann started intensive cardiac rehab on 01/17/23 and did well with exercise. Vital signs stable Winniefred started intensive cardiac rehab on 01/17/23 and is off to a good start to exercise. Vital signs have been stable. Jaqueline's weight was up on Monday. Weight today 110.3 kg. which is down. Jaqueline did notify the transplant coordinator. No complaints or symptoms. Hadija is doing well with exercise at  intensive cardiac rehab . Vital signs have been stable. Jaqueline has gained 1.4 kg since starting the program, Maisley is doing well with exercise at  intensive cardiac rehab . Vital signs have been stable. Gaspar Cola has gained 3.4  kg since starting the program, Gaspar Cola will complete cardiac rehab on 03/23/23 Coni is doing well with exercise at intensive cardiac rehab . Vital signs have been stable. Gaspar Cola has gained 3.4  kg since starting the program, Gaspar Cola will complete cardiac rehab on 04/20/23.   Expected Outcomes  Jaqueline will continue to participate in intensive cardiac rehab for exercise, nutrtion and lifestyle modifications Jaqueline will continue to participate in intensive cardiac rehab for exercise, nutrtion and lifestyle modifications Jaqueline will continue to participate in intensive cardiac rehab for exercise, nutrtion and lifestyle modifications Jaqueline will continue to participate in intensive cardiac rehab for exercise, nutrtion and lifestyle modifications Jaqueline will continue to participate in intensive cardiac rehab for exercise, nutrtion and lifestyle modifications            Core Components/Risk Factors/Patient Goals at Discharge (Final Review):   Goals and Risk Factor Review - 04/10/23 1231       Core Components/Risk Factors/Patient Goals Review   Personal Goals Review Weight Management/Obesity;Hypertension    Review Gleneva is doing well with exercise at intensive cardiac rehab . Vital signs have been stable. Gaspar Cola has gained 3.4  kg since starting the program, Gaspar Cola will complete cardiac rehab on 04/20/23.    Expected Outcomes Jaqueline will continue to participate in intensive cardiac rehab for exercise, nutrtion and lifestyle modifications             ITP Comments:  ITP Comments     Row Name 01/09/23 1359 01/17/23 1412 01/24/23 0919 02/15/23 0933 03/14/23 0957   ITP Comments Armanda Magic, MD - Medical Director. Introduction to the Pritikin Education Program/Intensive Cardiac Rehab. Initial orientation packet reviewed with the patient 30 Day ITP Review. Jaqueline started intensive cardiac rehab on 01/17/23 and did well with exercise 30 Day ITP Review. Jaqueline started intensive cardiac rehab on 01/17/23 and is off to a good start to exercise 30 Day ITP Review. Gaspar Cola has good attendance and participation in intensive cardiac rehab 30 Day ITP Review. Gaspar Cola continues to have  good attendance and participation in intensive cardiac rehab. Gaspar Cola will  complete intensive cardiac rehab on 03/23/23    Row Name 04/10/23 1229  ITP Comments 30 Day ITP Review. Gaspar Cola continues to have good attendance and participation in intensive cardiac rehab. Gaspar Cola will complete intensive cardiac rehab on 04/20/23.                Comments: See ITP comments.

## 2023-04-11 ENCOUNTER — Encounter (HOSPITAL_COMMUNITY): Payer: BC Managed Care – PPO

## 2023-04-13 ENCOUNTER — Encounter (HOSPITAL_COMMUNITY): Payer: BC Managed Care – PPO

## 2023-04-16 ENCOUNTER — Encounter (HOSPITAL_COMMUNITY)
Admission: RE | Admit: 2023-04-16 | Discharge: 2023-04-16 | Disposition: A | Discharge status: Home / Self Care | Payer: BC Managed Care – PPO | Source: Ambulatory Visit | Attending: Cardiology | Admitting: Cardiology

## 2023-04-16 DIAGNOSIS — Z941 Heart transplant status: Secondary | ICD-10-CM

## 2023-04-16 DIAGNOSIS — I5022 Chronic systolic (congestive) heart failure: Secondary | ICD-10-CM

## 2023-04-18 ENCOUNTER — Encounter (HOSPITAL_COMMUNITY)
Admission: RE | Admit: 2023-04-18 | Discharge: 2023-04-18 | Disposition: A | Discharge status: Home / Self Care | Payer: BC Managed Care – PPO | Source: Ambulatory Visit | Attending: Cardiology | Admitting: Cardiology

## 2023-04-18 VITALS — Ht 67.75 in | Wt 247.1 lb

## 2023-04-18 DIAGNOSIS — Z941 Heart transplant status: Secondary | ICD-10-CM | POA: Diagnosis not present

## 2023-04-20 ENCOUNTER — Encounter (HOSPITAL_COMMUNITY)
Admission: RE | Admit: 2023-04-20 | Discharge: 2023-04-20 | Disposition: A | Discharge status: Home / Self Care | Payer: BC Managed Care – PPO | Source: Ambulatory Visit | Attending: Cardiology | Admitting: Cardiology

## 2023-04-20 DIAGNOSIS — I5022 Chronic systolic (congestive) heart failure: Secondary | ICD-10-CM

## 2023-04-20 DIAGNOSIS — Z941 Heart transplant status: Secondary | ICD-10-CM | POA: Diagnosis not present

## 2023-04-20 NOTE — Progress Notes (Signed)
Discharge Progress Report  Patient Details  Name: PUALANI BORAH MRN: [id number] Date of Birth: [birth year] Referring Provider:   Flowsheet Row INTENSIVE CARDIAC REHAB ORIENT from 01/09/2023 in [facility]  Referring Provider Dr. Edwena Blow (Armanda Magic, MD covering)        Number of Visits: 87  Reason for Discharge:  Patient reached a stable level of exercise. Patient independent in their exercise. Patient has met program and personal goals.  Smoking History:  Social History   Tobacco Use  Smoking Status Never  Smokeless Tobacco Never    Diagnosis:  Status post heart transplant (HCC)  10/31/22 Heart transplant at [facility]  Heart failure, chronic systolic (HCC)  ADL [facility]:   Initial Exercise Prescription:  Initial Exercise Prescription - 01/09/23 1400       Date of Initial Exercise RX and Referring Provider   Date 01/09/23    Referring Provider Dr. Edwena Blow Armanda Magic, MD covering)    Expected Discharge Date 03/23/23      Arm Ergometer   Level 1.5    Watts 25    RPM 60    Minutes 15    METs 5.8      Recumbant Elliptical   Level 2    RPM 60    Watts 90    Minutes 15    METs 5.8      Prescription Details   Frequency (times per week) 3    Duration Progress to 30 minutes of continuous aerobic without signs/symptoms of physical distress      Intensity   THRR 40-80% of Max Heartrate 78-156    Ratings of Perceived Exertion 11-13    Perceived Dyspnea 0-4      Progression   Progression Continue progressive overload as per policy without signs/symptoms or physical distress.      Resistance Training   Training Prescription Yes    Weight 3 lbs    Reps 10-15             Discharge Exercise Prescription (Final Exercise Prescription Changes):  Exercise Prescription Changes - 04/20/23 1600       Response to Exercise   Blood Pressure (Admit) 120/80    Blood Pressure (Exercise) 120/82    Blood  Pressure (Exit) 98/72    Heart Rate (Admit) 89 bpm    Heart Rate (Exercise) 117 bpm    Heart Rate (Exit) 100 bpm    Rating of Perceived Exertion (Exercise) 12    Symptoms None    Comments Pt graduated from the CRP2 program    Duration Continue with 30 min of aerobic exercise without signs/symptoms of physical distress.    Intensity THRR unchanged      Progression   Progression Continue to progress workloads to maintain intensity without signs/symptoms of physical distress.    Average METs 3.7      Resistance Training   Training Prescription Yes    Weight 3 lbs    Reps 10-15    Time 10 Minutes      Interval Training   Interval Training No      Arm Ergometer   Level 2    Watts 26    RPM 64    Minutes 15    METs 3.4      Recumbant Elliptical   Level 4    RPM 49    Watts 114    Minutes 15    METs 3.9  Home Exercise Plan   Plans to continue exercise at Home (comment)    Frequency Add 3 additional days to program exercise sessions.    Initial Home Exercises Provided 02/23/23             Functional Capacity:  6 Minute Walk     Row Name 01/09/23 1209 04/16/23 0915       6 Minute Walk   Phase Initial Discharge    Distance 1650 feet 1866 feet    Distance Feet Change -- 216 ft    Walk Time 6 minutes 6 minutes    # of Rest Breaks 0 0    MPH 3.13 3.53    METS 5.81 6    RPE 12 14    Perceived Dyspnea  0 0    VO2 Peak 20.3 20.73    Symptoms Yes (comment) No    Comments Bilateral calf tightness --    Resting HR 88 bpm 92 bpm    Resting BP 130/90 118/72    Resting Oxygen Saturation  98 % --    Exercise Oxygen Saturation  during 6 min walk 96 % --    Max Ex. HR 111 bpm 111 bpm    Max Ex. BP 164/88 140/78    2 Minute Post BP 126/80 --             Psychological, QOL, Others - Outcomes: PHQ 2/9:    04/20/2023    9:44 AM 01/09/2023    2:35 PM 06/22/2021    7:50 AM 03/30/2021    1:58 PM  Depression screen PHQ 2/9  Decreased Interest 0 0 0 1  Down,  Depressed, Hopeless 1 0 1 1  PHQ - 2 Score 1 0 1 2  Altered sleeping 0 0 0 0  Tired, decreased energy 1 0 0 1  Change in appetite 1 1 0 1  Feeling bad or failure about yourself  0 0 0 0  Trouble concentrating 0 0 0 0  Moving slowly or fidgety/restless 0 0 0 0  Suicidal thoughts 0 0 0 0  PHQ-9 Score 3 1 1 4   Difficult doing work/chores Not difficult at all Not difficult at all Not difficult at all Somewhat difficult    Quality of Life:  Quality of Life - 04/20/23 1000       Quality of Life   Select Quality of Life      Quality of Life Scores   Health/Function Pre 24.75 %    Health/Function Post 29.57 %    Health/Function % Change 19.47 %    Socioeconomic Pre 21.36 %    Socioeconomic Post 29.14 %    Socioeconomic % Change  36.42 %    Psych/Spiritual Pre 24 %    Psych/Spiritual Post 25 %    Psych/Spiritual % Change 4.17 %    Family Pre 27.88 %    Family Post 24 %    Family % Change -13.92 %    GLOBAL Pre 24.23 %    GLOBAL Post 27.87 %    GLOBAL % Change 15.02 %             Personal Goals: Goals established at orientation with interventions provided to work toward goal.  Personal Goals and Risk Factors at Admission - 01/09/23 1525       Core Components/Risk Factors/Patient Goals on Admission    Weight Management Yes;Weight Loss    Intervention Weight Management: Develop a combined nutrition and exercise program designed to  reach desired caloric intake, while maintaining appropriate intake of nutrient and fiber, sodium and fats, and appropriate energy expenditure required for the weight goal.;Weight Management: Provide education and appropriate resources to help participant work on and attain dietary goals.;Weight Management/Obesity: Establish reasonable short term and long term weight goals.;Obesity: Provide education and appropriate resources to help participant work on and attain dietary goals.    Admit Weight 240 lb 4.8 oz (109 kg)    Expected Outcomes Short Term:  Continue to assess and modify interventions until short term weight is achieved;Long Term: Adherence to nutrition and physical activity/exercise program aimed toward attainment of established weight goal;Weight Loss: Understanding of general recommendations for a balanced deficit meal plan, which promotes 1-2 lb weight loss per week and includes a negative energy balance of [phone] kcal/d;Understanding recommendations for meals to include 15-35% energy as protein, 25-35% energy from fat, 35-60% energy from carbohydrates, less than 200mg  of dietary cholesterol, 20-35 gm of total fiber daily;Understanding of distribution of calorie intake throughout the day with the consumption of 4-5 meals/snacks    Hypertension Yes    Intervention Provide education on lifestyle modifcations including regular physical activity/exercise, weight management, moderate sodium restriction and increased consumption of fresh fruit, vegetables, and low fat dairy, alcohol moderation, and smoking cessation.;Monitor prescription use compliance.    Expected Outcomes Short Term: Continued assessment and intervention until BP is < 140/47mm HG in hypertensive participants. < 130/48mm HG in hypertensive participants with diabetes, heart failure or chronic kidney disease.;Long Term: Maintenance of blood pressure at goal levels.              Personal Goals Discharge:  Goals and Risk Factor Review     Row Name 01/17/23 1418 01/24/23 0925 02/15/23 0935 03/14/23 1000 04/10/23 1231     Core Components/Risk Factors/Patient Goals Review   Personal Goals Review Weight Management/Obesity;Hypertension Weight Management/Obesity;Hypertension Weight Management/Obesity;Hypertension Weight Management/Obesity;Hypertension Weight Management/Obesity;Hypertension   Review Mitsue started intensive cardiac rehab on 01/17/23 and did well with exercise. Vital signs stable Nakayla started intensive cardiac rehab on 01/17/23 and is off to a good  start to exercise. Vital signs have been stable. Jaqueline's weight was up on Monday. Weight today 110.3 kg. which is down. Jaqueline did notify the transplant coordinator. No complaints or symptoms. Nyliah is doing well with exercise at  intensive cardiac rehab . Vital signs have been stable. Jaqueline has gained 1.4 kg since starting the program, Gisel is doing well with exercise at  intensive cardiac rehab . Vital signs have been stable. Gaspar Cola has gained 3.4  kg since starting the program, Gaspar Cola will complete cardiac rehab on 03/23/23 Danee is doing well with exercise at intensive cardiac rehab . Vital signs have been stable. Gaspar Cola has gained 3.4  kg since starting the program, Gaspar Cola will complete cardiac rehab on 04/20/23.   Expected Outcomes Jaqueline will continue to participate in intensive cardiac rehab for exercise, nutrtion and lifestyle modifications Jaqueline will continue to participate in intensive cardiac rehab for exercise, nutrtion and lifestyle modifications Jaqueline will continue to participate in intensive cardiac rehab for exercise, nutrtion and lifestyle modifications Jaqueline will continue to participate in intensive cardiac rehab for exercise, nutrtion and lifestyle modifications Jaqueline will continue to participate in intensive cardiac rehab for exercise, nutrtion and lifestyle modifications            Exercise Goals and Review:  Exercise Goals     Row Name 01/09/23 1430             Exercise  Goals   Increase Physical Activity Yes       Intervention Provide advice, education, support and counseling about physical activity/exercise needs.;Develop an individualized exercise prescription for aerobic and resistive training based on initial evaluation findings, risk stratification, comorbidities and participant's personal goals.       Expected Outcomes Short Term: Attend rehab on a regular basis to increase amount of physical activity.;Long  Term: Add in home exercise to make exercise part of routine and to increase amount of physical activity.;Long Term: Exercising regularly at least 3-5 days a week.       Increase Strength and Stamina Yes       Intervention Provide advice, education, support and counseling about physical activity/exercise needs.;Develop an individualized exercise prescription for aerobic and resistive training based on initial evaluation findings, risk stratification, comorbidities and participant's personal goals.       Expected Outcomes Short Term: Increase workloads from initial exercise prescription for resistance, speed, and METs.;Short Term: Perform resistance training exercises routinely during rehab and add in resistance training at home;Long Term: Improve cardiorespiratory fitness, muscular endurance and strength as measured by increased METs and functional capacity ( )       Able to understand and use rate of perceived exertion (RPE) scale Yes       Intervention Provide education and explanation on how to use RPE scale       Expected Outcomes Short Term: Able to use RPE daily in rehab to express subjective intensity level;Long Term:  Able to use RPE to guide intensity level when exercising independently       Knowledge and understanding of Target Heart Rate Range (THRR) Yes       Intervention Provide education and explanation of THRR including how the numbers were predicted and where they are located for reference       Expected Outcomes Short Term: Able to state/look up THRR;Long Term: Able to use THRR to govern intensity when exercising independently;Short Term: Able to use daily as guideline for intensity in rehab       Understanding of Exercise Prescription Yes       Intervention Provide education, explanation, and written materials on patient's individual exercise prescription       Expected Outcomes Short Term: Able to explain program exercise prescription;Long Term: Able to explain home exercise  prescription to exercise independently                Exercise Goals Re-Evaluation:  Exercise Goals Re-Evaluation     Row Name 01/17/23 1217 02/14/23 1050 03/26/23 1534 04/20/23 1623       Exercise Goal Re-Evaluation   Exercise Goals Review Increase Physical Activity;Increase Strength and Stamina;Able to understand and use rate of perceived exertion (RPE) scale;Knowledge and understanding of Target Heart Rate Range (THRR);Understanding of Exercise Prescription Increase Physical Activity;Increase Strength and Stamina;Able to understand and use rate of perceived exertion (RPE) scale;Knowledge and understanding of Target Heart Rate Range (THRR);Understanding of Exercise Prescription Increase Physical Activity;Increase Strength and Stamina;Able to understand and use rate of perceived exertion (RPE) scale;Knowledge and understanding of Target Heart Rate Range (THRR);Understanding of Exercise Prescription Increase Physical Activity;Increase Strength and Stamina;Able to understand and use rate of perceived exertion (RPE) scale;Knowledge and understanding of Target Heart Rate Range (THRR);Understanding of Exercise Prescription    Comments Pt's first day in the CRP2 program. Pt understnads the exercise Rx, RPE scale and THRR. Reviewed METs and goals. Pt is making progress. Pt voices that she has seen imrpove in her strength and stamina  as well as her upperbody strength, both patient goals. Pt also has been able to walk 2 miles which is also a patient goal. Reviewed METs and goals. Pt continues tto make good progress. Pt voices that she continues to see improvement in her strength and stamina as well as her upperbody strength, both patient goals. Pt also has been able to walk 2 miles which is also a patient goal. Pt has not resumed swimming yet which is also a goal. Pt graduated from the CRP2 program today. Peak METs were 5.9. Pt has improved strength and stamina and is walking at home. Pt will continue to  exercise at home by walking.    Expected Outcomes Will continue to montior patient and progress exercise workloads as tolerated. Will continue to montior patient and progress exercise workloads as tolerated. Will continue to montior patient and progress exercise workloads as tolerated. Pt will continue to exercise at home.             Nutrition & Weight - Outcomes:  Pre Biometrics - 01/09/23 1042       Pre Biometrics   Waist Circumference 42.5 inches    Hip Circumference 49 inches    Waist to Hip Ratio 0.87 %    Triceps Skinfold 47 mm    % Body Fat 46.4 %    Grip Strength 33 kg    Flexibility 18 in    Single Leg Stand 30 seconds             Post Biometrics - 04/18/23 0826        Post  Biometrics   Height 5' 7.75" (1.721 m)    Weight 112.1 kg    Waist Circumference 43 inches    Hip Circumference 49.5 inches    Waist to Hip Ratio 0.87 %    BMI (Calculated) 37.85    Triceps Skinfold 47 mm    % Body Fat 46.1 %    Grip Strength 33 kg    Flexibility 19 in    Single Leg Stand 30 seconds             Nutrition:  Nutrition Therapy & Goals - 03/19/23 1027       Nutrition Therapy   Diet Heart Healthy Diet    Drug/Food Interactions Statins/Certain Fruits      Personal Nutrition Goals   Nutrition Goal Patient to identify strategies for reducing cardiovascular risk by attending the [facility] education and nutrition series weekly.    Personal Goal #2 Patient to improve diet quality by using the plate method as a guide for meal planning to include lean protein/plant protein, fruits, vegetables, whole grains, nonfat dairy as part of a well-balanced diet.    Personal Goal #3 Patient to identify strategies for weight loss of 0.5-2.0# per week.    Comments Goals in progress.Lisaann continues to attend the [facility] education and nutrition series regularly. Shaquoia reports weight gain of ~80# over the last year and she is motivated to lose weight. We have discussed multiple  strategies for weight loss including benefits of high protein intake, reducing carbohydrate/fat intake, mindful eating, protein supplements, tracking intake, etc. Answered many questions regarding binge type eating and mindful eating strategies. She has discussed GLP-1s with her cardiologist and is awaiting approval from insurance company. Per transplant notes, she will begin to wean off prednisone. She has not met weight loss goals but is up 7.7# since starting with our program. 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  Omaya will benefit from participation in intensive cardiac rehab for nutrition, exercise, and lifestyle modification.      Intervention Plan   Intervention Prescribe, educate and counsel regarding individualized specific dietary modifications aiming towards targeted core components such as weight, hypertension, lipid management, diabetes, heart failure and other comorbidities.;Nutrition handout(s) given to patient.    Expected Outcomes Short Term Goal: Understand basic principles of dietary content, such as calories, fat, sodium, cholesterol and nutrients.;Long Term Goal: Adherence to prescribed nutrition plan.             Nutrition Discharge:  Nutrition Assessments - 04/24/23 1419       Rate Your Plate Scores   Pre Score 69    Post Score 80             Education Questionnaire Score:  Knowledge Questionnaire Score - 04/20/23 0952       Knowledge Questionnaire Score   Post Score 24/24             Goals reviewed with patient; copy given to patient.Pt graduates from  Intensive/Traditional cardiac rehab program on 04/20/23  with completion of  27 exercise and 23 education sessions. Pt maintained good attendance and progressed nicely during their participation in rehab as evidenced by increased MET level. Katoya increased her distance on her post exercise walk test by 216 feet. Medication list reconciled. Repeat  PHQ score- 3 .   Pt has made significant lifestyle changes and should be commended for their success. Mackena achieved their goals during cardiac rehab.   Pt plans to continue exercise swimming, participating in water aerobic's 3 days a week to work on cardio and strength training. We are proud of Ondrea's progress! Destiny says that participating in cardiac rehab has been helpful for her and she feels stronger.  Thayer Headings RN BSN

## 2023-08-26 ENCOUNTER — Encounter (HOSPITAL_BASED_OUTPATIENT_CLINIC_OR_DEPARTMENT_OTHER): Payer: Self-pay | Admitting: Emergency Medicine

## 2023-08-26 ENCOUNTER — Emergency Department (HOSPITAL_BASED_OUTPATIENT_CLINIC_OR_DEPARTMENT_OTHER): Payer: BC Managed Care – PPO

## 2023-08-26 ENCOUNTER — Emergency Department (HOSPITAL_BASED_OUTPATIENT_CLINIC_OR_DEPARTMENT_OTHER)
Admission: EM | Admit: 2023-08-26 | Discharge: 2023-08-26 | Disposition: A | Discharge status: Home / Self Care | Payer: BC Managed Care – PPO | Attending: Emergency Medicine | Admitting: Emergency Medicine

## 2023-08-26 ENCOUNTER — Emergency Department (HOSPITAL_BASED_OUTPATIENT_CLINIC_OR_DEPARTMENT_OTHER): Payer: BC Managed Care – PPO | Admitting: Radiology

## 2023-08-26 ENCOUNTER — Other Ambulatory Visit: Payer: Self-pay

## 2023-08-26 DIAGNOSIS — R609 Edema, unspecified: Secondary | ICD-10-CM

## 2023-08-26 DIAGNOSIS — Z7982 Long term (current) use of aspirin: Secondary | ICD-10-CM | POA: Diagnosis not present

## 2023-08-26 DIAGNOSIS — M7989 Other specified soft tissue disorders: Secondary | ICD-10-CM | POA: Diagnosis not present

## 2023-08-26 DIAGNOSIS — M79601 Pain in right arm: Secondary | ICD-10-CM | POA: Insufficient documentation

## 2023-08-26 DIAGNOSIS — I808 Phlebitis and thrombophlebitis of other sites: Secondary | ICD-10-CM

## 2023-08-26 LAB — URINALYSIS, ROUTINE W REFLEX MICROSCOPIC
Bilirubin Urine: NEGATIVE
Glucose, UA: NEGATIVE mg/dL
Hgb urine dipstick: NEGATIVE
Ketones, ur: NEGATIVE mg/dL
Leukocytes,Ua: NEGATIVE
Nitrite: NEGATIVE
Protein, ur: NEGATIVE mg/dL
Specific Gravity, Urine: 1.019 (ref 1.005–1.030)
pH: 5 (ref 5.0–8.0)

## 2023-08-26 LAB — CBC WITH DIFFERENTIAL/PLATELET
Abs Immature Granulocytes: 0.01 10*3/uL (ref 0.00–0.07)
Basophils Absolute: 0 10*3/uL (ref 0.0–0.1)
Basophils Relative: 0 %
Eosinophils Absolute: 0.1 10*3/uL (ref 0.0–0.5)
Eosinophils Relative: 2 %
HCT: 34.1 % — ABNORMAL LOW (ref 36.0–46.0)
Hemoglobin: 10.7 g/dL — ABNORMAL LOW (ref 12.0–15.0)
Immature Granulocytes: 0 %
Lymphocytes Relative: 41 %
Lymphs Abs: 2 10*3/uL (ref 0.7–4.0)
MCH: 25.8 pg — ABNORMAL LOW (ref 26.0–34.0)
MCHC: 31.4 g/dL (ref 30.0–36.0)
MCV: 82.2 fL (ref 80.0–100.0)
Monocytes Absolute: 0.2 10*3/uL (ref 0.1–1.0)
Monocytes Relative: 3 %
Neutro Abs: 2.6 10*3/uL (ref 1.7–7.7)
Neutrophils Relative %: 54 %
Platelets: 210 10*3/uL (ref 150–400)
RBC: 4.15 MIL/uL (ref 3.87–5.11)
RDW: 17.5 % — ABNORMAL HIGH (ref 11.5–15.5)
WBC: 4.9 10*3/uL (ref 4.0–10.5)
nRBC: 0 % (ref 0.0–0.2)

## 2023-08-26 LAB — BASIC METABOLIC PANEL
Anion gap: 7 (ref 5–15)
BUN: 8 mg/dL (ref 6–20)
CO2: 26 mmol/L (ref 22–32)
Calcium: 8.8 mg/dL — ABNORMAL LOW (ref 8.9–10.3)
Chloride: 105 mmol/L (ref 98–111)
Creatinine, Ser: 0.77 mg/dL (ref 0.44–1.00)
GFR, Estimated: >60 mL/min (ref >60)
Glucose, Bld: 114 mg/dL — ABNORMAL HIGH (ref 70–99)
Potassium: 4.2 mmol/L (ref 3.5–5.1)
Sodium: 138 mmol/L (ref 135–145)

## 2023-08-26 LAB — WET PREP, GENITAL
Clue Cells Wet Prep HPF POC: NONE SEEN
Sperm: NONE SEEN
Trich, Wet Prep: NONE SEEN
WBC, Wet Prep HPF POC: <10 (ref <10)
Yeast Wet Prep HPF POC: NONE SEEN

## 2023-08-26 NOTE — Discharge Instructions (Addendum)
Use warm compresses to the area of swelling and discomfort.  The transplant Cardiologist advised that you should use monistat for yeast infection.  Your urine was clear.  The ultrasound of your right arm shows a superficial blood clot in the right basilic vein.

## 2023-08-26 NOTE — ED Notes (Signed)
Patient released from this ED at this time with family. Patient verbalized understanding of [facility].

## 2023-08-26 NOTE — ED Provider Notes (Signed)
[place] EMERGENCY DEPARTMENT AT [facility] Provider Note   CSN: [id number] Arrival date & time: 08/26/23  1420     History  Chief Complaint  Patient presents with   Arm Pain    right    [name] is a 27 y.o. female.  27 year old female who is a heart transplant patient followed by [facility] transplant team recently admitted to [facility] for cytomegalovirus currently has PICC line in place for ongoing treatment presents today for concern of DVT in the right upper extremity.  She states Thursday she noticed some discomfort but thought it was from compression of the PICC line into her arm however she noticed "ropelike structure" over the vein in the right forearm.  She called the transplant team who wanted her evaluated for DVT study.  Denies chest pain, shortness of breath.  She is also concerned regarding yeast infection after recent UTI which was treated.  The history is provided by the patient. No language interpreter was used.       Home Medications Prior to Admission medications   Medication Sig Start Date End Date Taking? Authorizing Provider  acetaminophen (TYLENOL) 325 MG tablet Take 650 mg by mouth every 6 (six) hours as needed. 11/07/22 11/07/23  [provider]  ASPIRIN ADULT LOW DOSE 81 MG tablet Take 81 mg by mouth daily.    [provider]  buPROPion (WELLBUTRIN SR) 150 MG 12 hr tablet Take 100 mg by mouth daily. 01/08/23 01/08/24  [provider]  Calcium Carb-Cholecalciferol 600-10 MG-MCG TABS Take 600 mcg by mouth in the morning and at bedtime. 11/07/22 11/07/23  [provider]  diazepam (VALIUM) 5 MG tablet Take 5 mg by mouth as needed. Prior to biopsy 01/08/23   [provider]  gabapentin (NEURONTIN) 400 MG capsule Take 400 mg by mouth 3 (three) times daily. 11/17/22   [provider]  hydrOXYzine (VISTARIL) 25 MG capsule Take 25 mg by mouth 2 (two) times daily as needed for anxiety. 11/27/22    [provider]  ketoconazole (NIZORAL) 2 % cream Apply 1 Application topically daily as needed for irritation.    [provider]  ketoconazole (NIZORAL) 2 % shampoo Apply 1 Application topically 2 (two) times a week. As needed    [provider]  methocarbamol (ROBAXIN) 500 MG tablet Take 500 mg by mouth 3 (three) times daily as needed for muscle spasms. 11/17/22   [provider]  mycophenolate (CELLCEPT) 250 MG capsule Take 1,000 mg by mouth 2 (two) times daily.    [provider]  nystatin (MYCOSTATIN) 100000 UNIT/ML suspension Take 5 mLs by mouth 4 (four) times daily. Patient not taking: Reported on 04/20/2023 11/07/22   [provider]  pantoprazole (PROTONIX) 40 MG tablet Take 40 mg by mouth daily.    [provider]  predniSONE (DELTASONE) 5 MG tablet Take 7.5 mg by mouth daily with breakfast. Take as directed 12/27/22   [provider]  rosuvastatin (CRESTOR) 10 MG tablet Take 1 tablet by mouth daily. 03/29/23 03/28/24  [provider]  sertraline (ZOLOFT) 100 MG tablet Take 100 mg by mouth daily. 02/04/22   [provider]  sulfamethoxazole-trimethoprim (BACTRIM) 400-80 MG tablet Take 1 tablet by mouth daily.    [provider]  tacrolimus (PROGRAF) 1 MG capsule Take 1 mg by mouth 2 (two) times daily. 4 mg at 0900 3 mg at 2100    [provider]  valACYclovir (VALTREX) 1000 MG tablet Take  1,000 mg by mouth as needed for rash. Patient not taking: Reported on 01/09/2023 09/06/20   [provider]  valGANciclovir (VALCYTE) 450 MG tablet Take 900 mg by mouth daily.    [provider]      Allergies    Tape    Review of Systems   Review of Systems  Constitutional:  Negative for chills and fever.  Respiratory:  Negative for shortness of breath.   Cardiovascular:  Negative for chest pain.  Gastrointestinal:  Negative for nausea.  Neurological:  Negative for  light-headedness.  All other systems reviewed and are negative.   Physical Exam Updated Vital Signs BP (!) 124/91 (BP Location: Left Arm)   Pulse 95   Temp 98.7 F (37.1 C) (Oral)   Resp 18   SpO2 100%  Physical Exam Vitals and nursing note reviewed.  Constitutional:      General: She is not in acute distress.    Appearance: Normal appearance. She is not ill-appearing.  HENT:     Head: Normocephalic and atraumatic.     Nose: Nose normal.  Eyes:     Conjunctiva/sclera: Conjunctivae normal.  Cardiovascular:     Rate and Rhythm: Normal rate and regular rhythm.     Heart sounds: Normal heart sounds.  Pulmonary:     Effort: Pulmonary effort is normal. No respiratory distress.     Breath sounds: Normal breath sounds. No wheezing.  Musculoskeletal:        General: No deformity. Normal range of motion.     Cervical back: Normal range of motion.  Skin:    Findings: No rash.  Neurological:     Mental Status: She is alert.     ED Results / Procedures / Treatments   Labs (all labs ordered are listed, but only abnormal results are displayed) Labs Reviewed  CBC WITH DIFFERENTIAL/PLATELET  BASIC METABOLIC PANEL    EKG None  Radiology No results found.  Procedures Procedures    Medications Ordered in ED Medications - No data to display  ED Course/ Medical Decision Making/ A&P                                 Medical Decision Making Amount and/or Complexity of Data Reviewed Labs: ordered. Radiology: ordered.   27 year old female who is a heart transplant patient followed by [facility] transplant team recently admitted to [facility] for cytomegalovirus discharged on 10/16.  PICC line placed 1 week ago.  Presents today for concern of DVT to the right upper extremity.  Does have history of DVT including requiring thrombectomy in the popliteal vein of the left lower extremity.  Symptoms started Thursday.  I did speak to patient's on-call transplant cardiology team at [facility].   They state patient should undergo typical DVT workup with DVT study.  No special precautions from their standpoint.  If this is positive patient can be started on anticoagulant.  Basic blood work, chest x-ray obtained.  Chest x-ray to look for PICC line positioning.  Basic blood work overall looked reassuring.  Chest x-ray was still pending at the time of transfer however given that it is already 4:00 and ultrasound is only available until 7 did not wait until chest x-ray resulted prior to transfer.  Patient discharged in stable condition.  Patient will go to [facility] emergency department reviewed private vehicle.  Her sister will transport her.  Patient voices understanding and is in agreement  with plan.  Discussed with attending as well.   Final Clinical Impression(s) / ED Diagnoses Final diagnoses:  Right arm pain    Rx / DC Orders ED Discharge Orders     None         Marita Kansas, PA-C 08/26/23 1706    Alvira Monday, MD 08/27/23 2233

## 2023-08-26 NOTE — Progress Notes (Signed)
VASCULAR LAB    Right upper extremity venous duplex and left lower extremity venous duplex have been performed.  See CV proc for preliminary results.  Messaged results to Cheron Schaumann, PA-C via secure chat  Sherren Kerns, RVT 08/26/2023, 6:33 PM

## 2023-08-26 NOTE — ED Provider Notes (Signed)
Patient sent from [facility] for ultrasound of her right arm to evaluate for DVT.  Patient tells me that she also has swelling and discomfort in her left leg.  Patient is concerned that she could have a DVT in her left leg.  Patient has had multiple DVTs in the past.  Patient currently has a PICC line in her right arm.  Patient states her transplant coordinator ask if we could do a UA and evaluate her for a yeast infection. This ultrasound of left  leg shows no evidence of DVT. The ultrasound of right arm was a superficial basilic vein thrombosis.  No evidence of DVT. Patient did a vaginal swab which is negative for yeast and bacteria UA shows no sign of infection. I spoke with the [facility] transplant cardiologist who advised to have the patient use Monistat for symptoms of yeast infection.  She advised as long as PICC line flushes and runs patient can continue to use the PICC line.  She advised warm compresses to the area of swelling.   Patient is counseled on results.  She is advised to return if any problems.  She is advised to follow-up as scheduled with her transplant team at [facility].   Osie Cheeks 08/26/23 2035    Gwyneth Sprout, MD 08/26/23 2322

## 2023-08-26 NOTE — ED Triage Notes (Signed)
Pain in right arm between elbow and wrist. "Feels like when I had a dvt in the past.   Has PICC line in that arm, current being treated for CMV infection Cardiac transplant patient 10/2022  Also c/o yeast infection symptoms
